# Patient Record
Sex: Female | Born: 1970 | Hispanic: No | Smoking: Never smoker
Health system: Southern US, Community
[De-identification: ages and names within clinical notes are randomized; demographics above are authoritative.]

## PROBLEM LIST (undated history)

## (undated) DIAGNOSIS — K219 Gastro-esophageal reflux disease without esophagitis: Secondary | ICD-10-CM

## (undated) DIAGNOSIS — U071 COVID-19: Secondary | ICD-10-CM

## (undated) DIAGNOSIS — J309 Allergic rhinitis, unspecified: Secondary | ICD-10-CM

## (undated) DIAGNOSIS — R011 Cardiac murmur, unspecified: Secondary | ICD-10-CM

## (undated) DIAGNOSIS — G43909 Migraine, unspecified, not intractable, without status migrainosus: Secondary | ICD-10-CM

## (undated) DIAGNOSIS — Z8719 Personal history of other diseases of the digestive system: Secondary | ICD-10-CM

## (undated) DIAGNOSIS — N83202 Unspecified ovarian cyst, left side: Secondary | ICD-10-CM

## (undated) DIAGNOSIS — G4730 Sleep apnea, unspecified: Secondary | ICD-10-CM

## (undated) DIAGNOSIS — R2230 Localized swelling, mass and lump, unspecified upper limb: Secondary | ICD-10-CM

## (undated) DIAGNOSIS — N200 Calculus of kidney: Secondary | ICD-10-CM

## (undated) DIAGNOSIS — F419 Anxiety disorder, unspecified: Secondary | ICD-10-CM

## (undated) DIAGNOSIS — T7840XA Allergy, unspecified, initial encounter: Secondary | ICD-10-CM

## (undated) HISTORY — DX: Localized swelling, mass and lump, unspecified upper limb: R22.30

## (undated) HISTORY — DX: Gastro-esophageal reflux disease without esophagitis: K21.9

## (undated) HISTORY — PX: HERNIA REPAIR: SHX51

## (undated) HISTORY — PX: CHOLECYSTECTOMY: SHX55

## (undated) HISTORY — DX: COVID-19: U07.1

## (undated) HISTORY — DX: Allergy, unspecified, initial encounter: T78.40XA

## (undated) HISTORY — DX: Unspecified ovarian cyst, left side: N83.202

## (undated) HISTORY — DX: Migraine, unspecified, not intractable, without status migrainosus: G43.909

## (undated) HISTORY — PX: REDUCTION MAMMAPLASTY: SUR839

## (undated) HISTORY — DX: Cardiac murmur, unspecified: R01.1

## (undated) HISTORY — PX: ABDOMINAL HYSTERECTOMY: SHX81

---

## 2006-07-03 ENCOUNTER — Ambulatory Visit: Payer: Self-pay | Admitting: Gastroenterology

## 2006-10-13 ENCOUNTER — Ambulatory Visit: Payer: Self-pay | Admitting: Obstetrics and Gynecology

## 2006-10-19 ENCOUNTER — Inpatient Hospital Stay: Payer: Self-pay | Admitting: Obstetrics and Gynecology

## 2007-09-28 ENCOUNTER — Other Ambulatory Visit: Payer: Self-pay

## 2007-09-28 ENCOUNTER — Emergency Department: Payer: Self-pay | Admitting: Emergency Medicine

## 2007-10-04 ENCOUNTER — Ambulatory Visit: Payer: Self-pay | Admitting: Emergency Medicine

## 2007-12-17 ENCOUNTER — Ambulatory Visit: Payer: Self-pay | Admitting: Unknown Physician Specialty

## 2007-12-31 ENCOUNTER — Ambulatory Visit: Payer: Self-pay | Admitting: Internal Medicine

## 2008-01-27 ENCOUNTER — Ambulatory Visit: Payer: Self-pay | Admitting: Internal Medicine

## 2008-11-06 ENCOUNTER — Encounter (INDEPENDENT_AMBULATORY_CARE_PROVIDER_SITE_OTHER): Payer: Self-pay | Admitting: Specialist

## 2008-11-06 ENCOUNTER — Ambulatory Visit (HOSPITAL_BASED_OUTPATIENT_CLINIC_OR_DEPARTMENT_OTHER): Admission: RE | Admit: 2008-11-06 | Discharge: 2008-11-07 | Payer: Self-pay | Admitting: Specialist

## 2009-03-20 ENCOUNTER — Emergency Department: Payer: Self-pay | Admitting: Internal Medicine

## 2009-08-12 ENCOUNTER — Emergency Department: Payer: Self-pay | Admitting: Emergency Medicine

## 2009-11-20 ENCOUNTER — Emergency Department: Payer: Self-pay | Admitting: Emergency Medicine

## 2010-01-02 ENCOUNTER — Ambulatory Visit: Payer: Self-pay | Admitting: Neurology

## 2010-08-18 HISTORY — PX: GASTRIC BYPASS: SHX52

## 2010-08-18 HISTORY — PX: OTHER SURGICAL HISTORY: SHX169

## 2010-08-22 ENCOUNTER — Emergency Department: Payer: Self-pay

## 2010-09-18 ENCOUNTER — Ambulatory Visit: Payer: Self-pay | Admitting: Specialist

## 2010-09-30 ENCOUNTER — Ambulatory Visit: Payer: Self-pay | Admitting: Gastroenterology

## 2010-10-07 ENCOUNTER — Ambulatory Visit: Payer: Self-pay | Admitting: Specialist

## 2010-10-07 LAB — PATHOLOGY REPORT

## 2010-10-17 ENCOUNTER — Ambulatory Visit: Payer: Self-pay | Admitting: Specialist

## 2010-11-07 ENCOUNTER — Ambulatory Visit: Payer: Self-pay | Admitting: Specialist

## 2010-11-12 ENCOUNTER — Inpatient Hospital Stay: Payer: Self-pay | Admitting: Specialist

## 2010-11-28 LAB — BASIC METABOLIC PANEL
BUN: 12 mg/dL (ref 6–23)
Chloride: 106 mEq/L (ref 96–112)
Creatinine, Ser: 0.86 mg/dL (ref 0.4–1.2)
Glucose, Bld: 90 mg/dL (ref 70–99)

## 2010-11-28 LAB — POCT HEMOGLOBIN-HEMACUE: Hemoglobin: 13.7 g/dL (ref 12.0–15.0)

## 2010-12-31 NOTE — Op Note (Signed)
NAMELEE-ANN, GAL                 ACCOUNT NO.:  [id number]   MEDICAL RECORD NO.:  [id number]          PATIENT TYPE:  AMB   LOCATION:  DSC                          FACILITY:  [facility]   PHYSICIAN:  Earvin Hansen L. Truesdale, M.D.DATE OF BIRTH:  Jun 03, 1971   DATE OF PROCEDURE:  11/06/2008  DATE OF DISCHARGE:                               OPERATIVE REPORT   Patient with severe macromastia back and shoulder pain secondary to  large pendulous breasts, intertriginous changes in the past failing  physical therapy as well as heat and cold packs and conservative drop  bras.   PROCEDURES PLANNED:  Bilateral breast reductions using the inferior  pedicle technique.   ANESTHESIA:  General.   The patient underwent general anesthesia and intubated orally after she  has had proper drawings done for the inferior pedicle reduction  mammoplasty, remarked the nipple-areolar complex back up 21 cm from the  suprasternal notch.  After the prep was done, 0.25% Xylocaine with  epinephrine was injected locally for vasoconstriction.  The wound was  first scored with #15 blade and the skin over the inferior pedicle was  de-epithelialized with #20 blade.  Medial and lateral fatty dermal  pedicles were incised down to underlying pectoralis fascia.  After  proper hemostasis, a new keyhole area was also debulked out laterally  and more accessory breast tissue removed.  Irrigation was done and  hemostasis again maintained with Bovie coagulation.  After this, the  flaps were transposed and stayed with 3-0 Prolene.  Subcutaneous closure  was done with 3-0 Monocryl x2 layers and running subcuticular stitches  of 3-0 Monocryl and 5-0 Monocryl throughout the inverted T.  At the end  of the procedure, both nipple-areolar complexes were examined with  excellent blood supply.  The wounds were drained with #10 fully fluted  Blake drains which were placed in the depths of the wound and the  lateral-most portion of the incision  was secured with 3-0 Prolene.  The  wounds were cleansed.  Steri-Strips and soft dressing were applied and  Xeroform, 4x4s, ABDs, and Hypafix tape.  She withstood the procedures  very well, was taken to recovery in excellent condition.      Yaakov Guthrie. Shon Hough, M.D.  Electronically Signed     GLT/MEDQ  D:  11/06/2008  T:  11/07/2008  Job:  [id number]

## 2011-04-03 ENCOUNTER — Ambulatory Visit: Payer: Self-pay | Admitting: Unknown Physician Specialty

## 2011-04-04 ENCOUNTER — Ambulatory Visit: Payer: Self-pay | Admitting: Bariatrics

## 2011-04-14 ENCOUNTER — Ambulatory Visit: Payer: Self-pay | Admitting: Specialist

## 2011-06-09 ENCOUNTER — Emergency Department: Payer: Self-pay | Admitting: *Deleted

## 2011-12-04 ENCOUNTER — Ambulatory Visit: Payer: Self-pay | Admitting: Internal Medicine

## 2012-01-12 ENCOUNTER — Emergency Department: Payer: Self-pay | Admitting: *Deleted

## 2012-04-16 ENCOUNTER — Ambulatory Visit: Payer: Self-pay | Admitting: Internal Medicine

## 2012-11-26 DIAGNOSIS — K912 Postsurgical malabsorption, not elsewhere classified: Secondary | ICD-10-CM | POA: Insufficient documentation

## 2012-11-26 HISTORY — DX: Postsurgical malabsorption, not elsewhere classified: K91.2

## 2012-12-06 ENCOUNTER — Ambulatory Visit: Payer: Self-pay | Admitting: Internal Medicine

## 2012-12-20 ENCOUNTER — Ambulatory Visit: Payer: Self-pay | Admitting: Internal Medicine

## 2013-03-18 ENCOUNTER — Ambulatory Visit: Payer: Self-pay | Admitting: Orthopedic Surgery

## 2013-09-22 ENCOUNTER — Emergency Department: Payer: Self-pay | Admitting: Emergency Medicine

## 2013-12-07 ENCOUNTER — Ambulatory Visit: Payer: Self-pay | Admitting: Internal Medicine

## 2013-12-12 ENCOUNTER — Ambulatory Visit: Payer: Self-pay | Admitting: Internal Medicine

## 2013-12-19 ENCOUNTER — Ambulatory Visit: Payer: Self-pay | Admitting: Internal Medicine

## 2013-12-19 HISTORY — PX: BREAST BIOPSY: SHX20

## 2013-12-21 LAB — PATHOLOGY REPORT

## 2014-01-20 DIAGNOSIS — G4733 Obstructive sleep apnea (adult) (pediatric): Secondary | ICD-10-CM

## 2014-01-20 HISTORY — DX: Obstructive sleep apnea (adult) (pediatric): G47.33

## 2014-02-02 ENCOUNTER — Ambulatory Visit: Payer: Self-pay | Admitting: Internal Medicine

## 2015-01-08 ENCOUNTER — Other Ambulatory Visit: Payer: Self-pay | Admitting: Internal Medicine

## 2015-01-08 DIAGNOSIS — D649 Anemia, unspecified: Secondary | ICD-10-CM | POA: Insufficient documentation

## 2015-01-08 DIAGNOSIS — Z1231 Encounter for screening mammogram for malignant neoplasm of breast: Secondary | ICD-10-CM

## 2015-01-08 DIAGNOSIS — R7303 Prediabetes: Secondary | ICD-10-CM | POA: Insufficient documentation

## 2015-01-08 HISTORY — DX: Anemia, unspecified: D64.9

## 2015-01-22 ENCOUNTER — Ambulatory Visit: Payer: Self-pay

## 2015-01-26 ENCOUNTER — Ambulatory Visit
Admission: RE | Admit: 2015-01-26 | Discharge: 2015-01-26 | Disposition: A | Discharge status: Home / Self Care | Payer: BLUE CROSS/BLUE SHIELD | Source: Ambulatory Visit | Attending: Internal Medicine | Admitting: Internal Medicine

## 2015-01-26 DIAGNOSIS — Z1231 Encounter for screening mammogram for malignant neoplasm of breast: Secondary | ICD-10-CM | POA: Diagnosis not present

## 2015-03-29 ENCOUNTER — Encounter: Payer: Self-pay | Admitting: Podiatry

## 2015-03-29 ENCOUNTER — Other Ambulatory Visit: Payer: Self-pay | Admitting: *Deleted

## 2015-03-29 ENCOUNTER — Ambulatory Visit (INDEPENDENT_AMBULATORY_CARE_PROVIDER_SITE_OTHER): Payer: BLUE CROSS/BLUE SHIELD | Admitting: Podiatry

## 2015-03-29 VITALS — BP 114/72 | HR 63 | Resp 17

## 2015-03-29 DIAGNOSIS — S90229A Contusion of unspecified lesser toe(s) with damage to nail, initial encounter: Secondary | ICD-10-CM | POA: Diagnosis not present

## 2015-03-29 DIAGNOSIS — L603 Nail dystrophy: Secondary | ICD-10-CM | POA: Diagnosis not present

## 2015-03-29 DIAGNOSIS — J309 Allergic rhinitis, unspecified: Secondary | ICD-10-CM | POA: Insufficient documentation

## 2015-03-29 NOTE — Progress Notes (Signed)
   Subjective:    Patient ID: Valerie Colon, female    DOB: 1971-06-23, 44 y.o.   MRN: [id number]  HPI 44 year old female presents the office today for concerns of discoloration to both her big toenails. She states is ongoing for proximally 2 months and his been up the same. She denies any pain to her toenails and she denies any surrounding redness or drainage. She denies any history of injury or trauma. No tenderness. No other complaints at this time.   Review of Systems  Hematological:       Anemia   All other systems reviewed and are negative.      Objective:   Physical Exam AAO x3, NAD DP/PT pulses palpable bilaterally, CRT less than 3 seconds Protective sensation intact with Simms Weinstein monofilament, vibratory sensation intact, Achilles tendon reflex intact On the left hallux toenail with central aspect is an area of subungual hematoma. The nails is firmly adhered to the underlying nail bed and there is no significant discoloration and thickening of the nail. The right hallux nail also has small areas of subungual hematoma nails have a hypertrophic, dystrophic, discolored and brittle. There is no tenderness to palpation around the nail. There is no surrounding erythema or drainage. No areas of tenderness to bilateral lower extremities. MMT 5/5, ROM WNL.  No open lesions or pre-ulcerative lesions.  No overlying edema, erythema, increase in warmth to bilateral lower extremities.  No pain with calf compression, swelling, warmth, erythema bilaterally.     Assessment & Plan:  44 year old female small areas of subungual hematoma bilateral hallux nails with slight discoloration and onychodystrophy right hallux toenail -Treatment options discussed including all alternatives, risks, and complications -Discussed likely etiology of her symptoms. -Discussed with the blood spots should grow out with the nail. Continue to monitor. I discussed nail removal with the patient in biopsy how she  wishes to hold off on that at this time. -The right hallux toenail was trimmed and sent for biopsy to [facility] labs for possible onychomycosis. I discussed. Treatment options however we will await the results of the biopsy before proceeding with treatment. -Follow-up after nail biopsy or sooner if any problems arise. In the meantime, encouraged to call the office with any questions, concerns, change in symptoms.   Celesta Gentile, DPM

## 2015-03-29 NOTE — Patient Instructions (Signed)

## 2015-04-12 ENCOUNTER — Telehealth: Payer: Self-pay | Admitting: *Deleted

## 2015-04-12 NOTE — Telephone Encounter (Signed)
CALLED PATIENT AND EXPLAINED THE FUNGAL CULTURE TO HER AND I TOLD HER THAT SHE COULD USE NOVAIL OR GO TO THE DRUG STORE AND GET FUNGI NAIL AND FUNGI CURE AND IF THAT DID NOT WORK TO CALL OFFICE BACK AND WE WOULD SEND A RX. Robin Pafford

## 2015-04-27 ENCOUNTER — Telehealth: Payer: Self-pay | Admitting: *Deleted

## 2015-04-27 MED ORDER — NUVAIL EX SOLN: 1.0000 [drp] | Freq: Every day | CUTANEOUS | Status: DC

## 2015-04-27 NOTE — Telephone Encounter (Signed)
PT STATES SHE RECEIVED A PHONE MESSAGE STATING HER TOENAIL CULTURE WAS NEGATIVE AND DR. Jacqualyn Posey WANTED HER TO USE NUVAIL.  I INFORMED PT THE NUVAIL WAS ORDERED FROM A PHARMACY THAT WOULD TRY TO GET NUVAIL COVERED BY INSURANCE, BUT IF IT DID NOT COST WOULD BE OUT OF HER POCKET.  PT STATES UNDERSTANDING AND I MAILED HER THE PHARMACY [facility] INFORMATION AND TOLD HER TO CALL WHEN SHE RECEIVED THE INFORMATION.  MAILED TO PT'S HOME.

## 2015-04-27 NOTE — Telephone Encounter (Signed)
Called patient and left message stating that the fungal culture was negative and that we could try nuvail and to call the office. Valerie Colon

## 2015-07-06 ENCOUNTER — Encounter: Payer: Self-pay | Admitting: Podiatry

## 2015-07-09 DIAGNOSIS — E559 Vitamin D deficiency, unspecified: Secondary | ICD-10-CM | POA: Insufficient documentation

## 2015-11-09 DIAGNOSIS — R002 Palpitations: Secondary | ICD-10-CM | POA: Insufficient documentation

## 2015-11-09 DIAGNOSIS — R457 State of emotional shock and stress, unspecified: Secondary | ICD-10-CM | POA: Insufficient documentation

## 2015-11-09 HISTORY — DX: Palpitations: R00.2

## 2015-11-09 HISTORY — DX: State of emotional shock and stress, unspecified: R45.7

## 2015-11-20 DIAGNOSIS — F411 Generalized anxiety disorder: Secondary | ICD-10-CM

## 2015-11-20 HISTORY — DX: Generalized anxiety disorder: F41.1

## 2016-01-24 DIAGNOSIS — R42 Dizziness and giddiness: Secondary | ICD-10-CM

## 2016-01-24 DIAGNOSIS — R5382 Chronic fatigue, unspecified: Secondary | ICD-10-CM | POA: Insufficient documentation

## 2016-01-24 DIAGNOSIS — K219 Gastro-esophageal reflux disease without esophagitis: Secondary | ICD-10-CM | POA: Insufficient documentation

## 2016-01-24 HISTORY — DX: Dizziness and giddiness: R42

## 2016-01-24 HISTORY — DX: Chronic fatigue, unspecified: R53.82

## 2016-01-25 ENCOUNTER — Other Ambulatory Visit: Payer: Self-pay | Admitting: Internal Medicine

## 2016-01-25 DIAGNOSIS — Z1239 Encounter for other screening for malignant neoplasm of breast: Secondary | ICD-10-CM

## 2016-02-12 ENCOUNTER — Ambulatory Visit: Payer: BLUE CROSS/BLUE SHIELD

## 2016-02-15 ENCOUNTER — Ambulatory Visit: Payer: BLUE CROSS/BLUE SHIELD

## 2016-02-22 ENCOUNTER — Other Ambulatory Visit: Payer: Self-pay | Admitting: Internal Medicine

## 2016-02-22 ENCOUNTER — Ambulatory Visit
Admission: RE | Admit: 2016-02-22 | Discharge: 2016-02-22 | Disposition: A | Discharge status: Home / Self Care | Payer: BLUE CROSS/BLUE SHIELD | Source: Ambulatory Visit | Attending: Internal Medicine | Admitting: Internal Medicine

## 2016-02-22 DIAGNOSIS — Z1231 Encounter for screening mammogram for malignant neoplasm of breast: Secondary | ICD-10-CM | POA: Insufficient documentation

## 2016-02-22 DIAGNOSIS — Z1239 Encounter for other screening for malignant neoplasm of breast: Secondary | ICD-10-CM

## 2016-02-29 DIAGNOSIS — N200 Calculus of kidney: Secondary | ICD-10-CM

## 2016-02-29 HISTORY — DX: Calculus of kidney: N20.0

## 2016-03-18 ENCOUNTER — Encounter: Payer: Self-pay | Admitting: Urology

## 2016-03-18 ENCOUNTER — Ambulatory Visit (INDEPENDENT_AMBULATORY_CARE_PROVIDER_SITE_OTHER): Payer: BLUE CROSS/BLUE SHIELD | Admitting: Urology

## 2016-03-18 VITALS — BP 111/72 | HR 59 | Ht 66.0 in | Wt 178.6 lb

## 2016-03-18 DIAGNOSIS — N200 Calculus of kidney: Secondary | ICD-10-CM

## 2016-03-18 LAB — URINALYSIS, COMPLETE
BILIRUBIN UA: NEGATIVE
Glucose, UA: NEGATIVE
Ketones, UA: NEGATIVE
Leukocytes, UA: NEGATIVE
NITRITE UA: NEGATIVE
PH UA: 5 (ref 5.0–7.5)
Protein, UA: NEGATIVE
RBC UA: NEGATIVE
Specific Gravity, UA: >1.03 — ABNORMAL HIGH (ref 1.005–1.030)
UUROB: 0.2 mg/dL (ref 0.2–1.0)

## 2016-03-18 LAB — MICROSCOPIC EXAMINATION

## 2016-03-18 NOTE — Progress Notes (Signed)
03/18/2016 9:16 AM   Valerie Colon 03/07/1971 [id number]  Referring provider: Glendon Axe, MD [facility] [place], [place] [address]  Chief Complaint  Patient presents with  . New Patient (Initial Visit)    Nephrolithiasis    HPI: [name] is a 45yo seen today for nephrolithiasis. She is referred by Dr. Candiss Norse.  She had a KUB at [place] [facility] but no CT imaging.  3 weeks ago she developed severe, intermittent, sharp, nonradiating left flank pain. The pain improved several days ago but she continues to have a dull ache. This is her 8th stone. Her first stone was 5 years. She had gastric bypass 5 years ago. She has never needed surgery for stones. She has never retrieved a stone for analysis.  She denies any LUTS. No hematuria.  UA today is normal.    PMH: Past Medical History:  Diagnosis Date  . Acid reflux   . Heart murmur     Surgical History: Past Surgical History:  Procedure Laterality Date  . ABDOMINAL HYSTERECTOMY    . BREAST BIOPSY Right 12/19/2013   stereo right  . gallbladder  2012  . GASTRIC BYPASS  2012  . REDUCTION MAMMAPLASTY Bilateral    2010    Home Medications:    Medication List       Accurate as of 03/18/16  9:16 AM. Always use your most recent med list.          ferrous sulfate 325 (65 FE) MG tablet TK 1 T PO D WITH BRE   multivitamin tablet Take 1 tablet by mouth daily.   NUVAIL Soln Apply 1 drop topically daily.   RA VITAMIN B-12 TR 1000 MCG Tbcr Generic drug:  Cyanocobalamin Take by mouth.       Allergies: No Known Allergies  Family History: No family history on file.  Social History:  reports that she has never smoked. She has never used smokeless tobacco. Her alcohol and drug histories are not on file.  ROS: UROLOGY Frequent Urination?: No Hard to postpone urination?: No Burning/pain with urination?: No Get up at night to urinate?: No Leakage of urine?: No Urine stream starts and  stops?: No Trouble starting stream?: No Do you have to strain to urinate?: No Blood in urine?: No Urinary tract infection?: No Sexually transmitted disease?: No Injury to kidneys or bladder?: No Painful intercourse?: No Weak stream?: No Currently pregnant?: No Vaginal bleeding?: No Last menstrual period?: n  Gastrointestinal Nausea?: No Vomiting?: No Indigestion/heartburn?: No Diarrhea?: No Constipation?: No  Constitutional Fever: No Night sweats?: No Weight loss?: No Fatigue?: Yes  Skin Skin rash/lesions?: No Itching?: No  Eyes Blurred vision?: No Double vision?: No  Ears/Nose/Throat Sore throat?: No Sinus problems?: No  Hematologic/Lymphatic Swollen glands?: No Easy bruising?: No  Cardiovascular Leg swelling?: No Chest pain?: No  Respiratory Cough?: No Shortness of breath?: No  Endocrine Excessive thirst?: No  Musculoskeletal Back pain?: Yes Joint pain?: No  Neurological Headaches?: No Dizziness?: No  Psychologic Depression?: No Anxiety?: No  Physical Exam: BP 111/72   Pulse (!) 59   Ht 5\' 6"  (1.676 m)   Wt 81 kg (178 lb 9.6 oz)   LMP  (LMP Unknown)   BMI 28.83 kg/m   Constitutional:  Alert and oriented, No acute distress. HEENT: [place] AT, moist mucus membranes.  Trachea midline, no masses. Cardiovascular: No clubbing, cyanosis, or edema. Respiratory: Normal respiratory effort, no increased work of breathing. GI: Abdomen is soft, nontender, nondistended,  no abdominal masses GU: No CVA tenderness.  Skin: No rashes, bruises or suspicious lesions. Lymph: No cervical or inguinal adenopathy. Neurologic: Grossly intact, no focal deficits, moving all 4 extremities. Psychiatric: Normal mood and affect.  Laboratory Data: Lab Results  Component Value Date   HGB 13.7 11/06/2008    Lab Results  Component Value Date   CREATININE 0.86 11/03/2008    No results found for: PSA  No results found for: TESTOSTERONE  No results found for:  HGBA1C  Urinalysis No results found for: COLORURINE, APPEARANCEUR, LABSPEC, PHURINE, GLUCOSEU, HGBUR, BILIRUBINUR, KETONESUR, PROTEINUR, UROBILINOGEN, NITRITE, LEUKOCYTESUR  Pertinent Imaging: none  Assessment & Plan:    1. Nephrolithiasis -Renal US and KUB today -BMP, iPTH, uric acid -24 hour urine - Urinalysis, Complete --RTC 2-3 weeks   No Follow-up on file.  Nicolette Bang, MD  [facility] [address], [place],  [place] [address] [phone]

## 2016-03-29 ENCOUNTER — Emergency Department
Admission: EM | Admit: 2016-03-29 | Discharge: 2016-03-30 | Disposition: A | Discharge status: Home / Self Care | Payer: BLUE CROSS/BLUE SHIELD | Attending: Emergency Medicine | Admitting: Emergency Medicine

## 2016-03-29 ENCOUNTER — Encounter: Payer: Self-pay | Admitting: Emergency Medicine

## 2016-03-29 ENCOUNTER — Emergency Department: Payer: BLUE CROSS/BLUE SHIELD

## 2016-03-29 DIAGNOSIS — S93402A Sprain of unspecified ligament of left ankle, initial encounter: Secondary | ICD-10-CM | POA: Diagnosis not present

## 2016-03-29 DIAGNOSIS — Y929 Unspecified place or not applicable: Secondary | ICD-10-CM | POA: Insufficient documentation

## 2016-03-29 DIAGNOSIS — Y999 Unspecified external cause status: Secondary | ICD-10-CM | POA: Insufficient documentation

## 2016-03-29 DIAGNOSIS — S93602A Unspecified sprain of left foot, initial encounter: Secondary | ICD-10-CM | POA: Diagnosis not present

## 2016-03-29 DIAGNOSIS — X501XXA Overexertion from prolonged static or awkward postures, initial encounter: Secondary | ICD-10-CM | POA: Insufficient documentation

## 2016-03-29 DIAGNOSIS — Z79899 Other long term (current) drug therapy: Secondary | ICD-10-CM | POA: Insufficient documentation

## 2016-03-29 DIAGNOSIS — Y9301 Activity, walking, marching and hiking: Secondary | ICD-10-CM | POA: Insufficient documentation

## 2016-03-29 DIAGNOSIS — M25572 Pain in left ankle and joints of left foot: Secondary | ICD-10-CM | POA: Diagnosis present

## 2016-03-29 MED ORDER — IBUPROFEN 200 MG PO TABS: 600.0000 mg | ORAL_TABLET | Freq: Four times a day (QID) | ORAL | 0 refills | Status: DC | PRN

## 2016-03-29 MED ORDER — IBUPROFEN 600 MG PO TABS
600.0000 mg | ORAL_TABLET | Freq: Once | ORAL | Status: AC
  Administered 2016-03-29: 600 mg via ORAL
  Filled 2016-03-29: qty 1

## 2016-03-29 NOTE — ED Provider Notes (Signed)
[facility] Emergency Department Provider Note  ____________________________________________   I have reviewed the triage vital signs and the nursing notes.   HISTORY  Chief Complaint Ankle Pain    HPI TAWNI WOLPER is a 45 y.o. female who inverted her left foot while walking. Did not hitting her head and has has no other complaints aside from bilateral ankle pain and lateral foot discomfort. She can bear weight.       Past Medical History:  Diagnosis Date  . Acid reflux   . Heart murmur     Patient Active Problem List   Diagnosis Date Noted  . Nephrolithiasis 02/29/2016  . Chronic fatigue 01/24/2016  . Dizziness 01/24/2016  . Gastroesophageal reflux disease without esophagitis 01/24/2016  . GAD (generalized anxiety disorder) 11/20/2015  . Emotional stress 11/09/2015  . Intermittent palpitations 11/09/2015  . Vitamin D deficiency 07/09/2015  . Allergic rhinitis 03/29/2015  . Chronic anemia 01/08/2015  . Prediabetes 01/08/2015  . Obstructive apnea 01/20/2014  . Hypoglycemia after GI (gastrointestinal) surgery 11/26/2012    Past Surgical History:  Procedure Laterality Date  . ABDOMINAL HYSTERECTOMY    . BREAST BIOPSY Right 12/19/2013   stereo right  . gallbladder  2012  . GASTRIC BYPASS  2012  . REDUCTION MAMMAPLASTY Bilateral    2010    Current Outpatient Rx  . Order #: [id number] Class: Historical Med  . Order #: [id number] Class: Normal  . Order #: [id number] Class: Historical Med  . Order #: [id number] Class: Historical Med    Allergies Review of patient's allergies indicates no known allergies.  History reviewed. No pertinent family history.  Social History Social History  Substance Use Topics  . Smoking status: Never Smoker  . Smokeless tobacco: Never Used  . Alcohol use No    Review of Systems   ____________________________________________   PHYSICAL EXAM:  VITAL SIGNS: ED Triage Vitals [03/29/16 2246]  Enc Vitals  Group     BP 114/74     Pulse Rate 86     Resp 16     Temp 98.4 F (36.9 C)     Temp Source Oral     SpO2 95 %     Weight 170 lb (77.1 kg)     Height 5\' 6"  (1.676 m)     Head Circumference      Peak Flow      Pain Score 3     Pain Loc      Pain Edu?      Excl. in [place]?     Constitutional: Alert and oriented. Well appearing and in no acute distress. Eyes: Conjunctivae are normal. PERRL. EOMI. Head: Atraumatic.  Musculoskeletal: Tennis palpation over the area just anterior to the lateral malleolus. There is no joint instability. Strong PT and dorsal pedal pulses. There is minimal tenderness to palpation to the mid foot. There is no evidence of instability noted. no upper extremity tenderness. No joint effusions, no DVT signs strong distal pulses no edema  Neurologic:  Normal speech and language. No gross focal neurologic deficits are appreciated.  Skin:  Skin is warm, dry and intact. No rash noted. Psychiatric: Mood and affect are normal. Speech and behavior are normal.  ____________________________________________   LABS (all labs ordered are listed, but only abnormal results are displayed)  Labs Reviewed - No data to display ____________________________________________  EKG  I personally interpreted any EKGs ordered by me or triage  ____________________________________________  RADIOLOGY  I reviewed any imaging ordered by me or triage that  were performed during my shift and, if possible, patient and/or family made aware of any abnormal findings. ____________________________________________   PROCEDURES  Procedure(s) performed: None  Procedures  Critical Care performed: None  ____________________________________________   INITIAL IMPRESSION / ASSESSMENT AND PLAN / ED COURSE  Pertinent labs & imaging results that were available during my care of the patient were reviewed by me and considered in my medical decision making (see chart for details).  Patient  with minimal discomfort after a fall with no evidence of swelling. Likely a stage I ankle sprain. No evidence of Lisfranc fracture other acute pathology neurovascular intact compartment are soft no knee pain or hip pain or tenderness. We'll treat her with Aircast, crutches, NSAIDs and follow-up.  Clinical Course   ____________________________________________   FINAL CLINICAL IMPRESSION(S) / ED DIAGNOSES  Final diagnoses:  None      This chart was dictated using voice recognition software.  Despite best efforts to proofread,  errors can occur which can change meaning.      Schuyler Amor, MD 03/29/16 [phone]

## 2016-03-29 NOTE — ED Triage Notes (Signed)
Pt to triage via [facility], reports stepped off a curb and twisted left ankle, pain to medial aspect of foot, through ankle, up lower leg.  Pt reports able to walk but with great difficulty.  Pt NAD at this time, resp equal and unlabored, skin warm and dry.

## 2016-04-01 DIAGNOSIS — S93629A Sprain of tarsometatarsal ligament of unspecified foot, initial encounter: Secondary | ICD-10-CM

## 2016-04-01 HISTORY — DX: Sprain of tarsometatarsal ligament of unspecified foot, initial encounter: S93.629A

## 2016-04-11 ENCOUNTER — Ambulatory Visit
Admission: RE | Admit: 2016-04-11 | Discharge: 2016-04-11 | Disposition: A | Discharge status: Home / Self Care | Payer: BLUE CROSS/BLUE SHIELD | Source: Ambulatory Visit | Attending: Urology | Admitting: Urology

## 2016-04-11 DIAGNOSIS — N200 Calculus of kidney: Secondary | ICD-10-CM

## 2016-04-11 DIAGNOSIS — N281 Cyst of kidney, acquired: Secondary | ICD-10-CM | POA: Insufficient documentation

## 2016-04-11 DIAGNOSIS — R140 Abdominal distension (gaseous): Secondary | ICD-10-CM | POA: Diagnosis not present

## 2016-04-18 ENCOUNTER — Ambulatory Visit (INDEPENDENT_AMBULATORY_CARE_PROVIDER_SITE_OTHER): Payer: BLUE CROSS/BLUE SHIELD | Admitting: Urology

## 2016-04-18 ENCOUNTER — Encounter: Payer: Self-pay | Admitting: Urology

## 2016-04-18 VITALS — BP 94/60 | HR 64 | Ht 66.0 in | Wt 184.0 lb

## 2016-04-18 DIAGNOSIS — N200 Calculus of kidney: Secondary | ICD-10-CM

## 2016-04-18 NOTE — Progress Notes (Signed)
04/18/2016 10:46 AM   Valerie Colon 05-May-1971 [id number]  Referring provider: Glendon Axe, MD [facility] [place], [place] [address]  Chief Complaint  Patient presents with  . Follow-up    Nephrolithiasis     HPI: 45 yo F who returns today for follow-up RUS/ KUB for kidney stones.    She was seen in the clinic last month by Dr. Alyson Ingles for evaluation of severe left flank pain which resolved into a dull ache. Since her last visit, her pain is completely resolved.  No urinary tract symptoms.  Follow-up renal ultrasound shows no evidence of hydronephrosis. She does have a 5 mm nonobstructing right lower pole calcification but otherwise no significant stone burden.  This is her 8th stone. Her first stone was 5 years. She had gastric bypass 5 years ago. She has never needed surgery for stones.  24 hour urine metabolic studies were ordered but not completed.  She does to her best to drink plenty of water.  PMH: Past Medical History:  Diagnosis Date  . Acid reflux   . Heart murmur     Surgical History: Past Surgical History:  Procedure Laterality Date  . ABDOMINAL HYSTERECTOMY    . BREAST BIOPSY Right 12/19/2013   stereo right  . gallbladder  2012  . GASTRIC BYPASS  2012  . REDUCTION MAMMAPLASTY Bilateral    2010    Home Medications:    Medication List       Accurate as of 04/18/16 11:59 PM. Always use your most recent med list.          ferrous sulfate 325 (65 FE) MG tablet TK 1 T PO D WITH BRE   ibuprofen 200 MG tablet Commonly known as:  MOTRIN IB Take 3 tablets (600 mg total) by mouth every 6 (six) hours as needed.   multivitamin tablet Take 1 tablet by mouth daily.   RA VITAMIN B-12 TR 1000 MCG Tbcr Generic drug:  Cyanocobalamin Take by mouth.       Allergies: No Known Allergies  Family History: No family history on file.  Social History:  reports that she has never smoked. She has never used smokeless tobacco.  She reports that she does not drink alcohol or use drugs.  ROS:  12 system ROS negative other than as per HPI  Physical Exam: BP 94/60   Pulse 64   Ht 5\' 6"  (1.676 m)   Wt 184 lb (83.5 kg)   LMP  (LMP Unknown)   BMI 29.70 kg/m   Constitutional:  Alert and oriented, No acute distress. HEENT: [place] AT, moist mucus membranes.  Trachea midline, no masses. Cardiovascular: No clubbing, cyanosis, or edema. Respiratory: Normal respiratory effort, no increased work of breathing. GI: Abdomen is soft, nontender, nondistended, no abdominal masses GU: No CVA tenderness.  Skin: No rashes, bruises or suspicious lesions. Neurologic: Grossly intact, no focal deficits, moving all 4 extremities. Psychiatric: Normal mood and affect.  Laboratory Data: Lab Results  Component Value Date   HGB 13.7 11/06/2008    Lab Results  Component Value Date   CREATININE 0.86 11/03/2008    Urinalysis    Component Value Date/Time   APPEARANCEUR Cloudy (A) 03/18/2016 0844   GLUCOSEU Negative 03/18/2016 0844   BILIRUBINUR Negative 03/18/2016 0844   PROTEINUR Negative 03/18/2016 0844   NITRITE Negative 03/18/2016 0844   LEUKOCYTESUR Negative 03/18/2016 0844    Pertinent Imaging: CLINICAL DATA:  Left side pain starting last night, history of kidney  stone  EXAM: ABDOMEN - 1 VIEW  COMPARISON:  02/23/2016  FINDINGS: There is normal small bowel gas pattern. Again noted 5 mm calcification lower pole region of the right kidney. Pelvic phleboliths are again noted.  IMPRESSION: Normal small bowel gas pattern.  Right nephrolithiasis.   Electronically Signed   By: Lahoma Crocker M.D.   On: 04/11/2016 13:27  CLINICAL DATA:  Nephrolithiasis  EXAM: RENAL / URINARY TRACT ULTRASOUND COMPLETE  COMPARISON:  None.  FINDINGS: Right Kidney:  Length: 10.3 cm. Small renal calculus is noted without obstructive change. A 1 cm cyst is noted within the upper pole of the right kidney.  Left  Kidney:  Length: 10.2 cm. Echogenicity within normal limits. No mass or hydronephrosis visualized.  Bladder:  Incompletely distended.  IMPRESSION: Tiny nonobstructing right renal stone.  Right renal cyst.  No acute abnormality noted.   Electronically Signed   By: Inez Catalina M.D.   On: 04/11/2016 13:56  KUB and RUS personally reviewed today  Assessment & Plan:    1. Nephrolithiasis Small nonobstructing right renal stone, otherwise no significant stone burden. No evidence of obstructing stone. Currently asymptomatic.  We discussed general stone prevention techniques including drinking plenty water with goal of producing 2.5 L urine daily, increased citric acid intake, avoidance of high oxalate containing foods, and decreased salt intake.  Information about dietary recommendations given today.   Option for metabolic workup reviewed again today. The patient would prefer to defer this at this time.  We'll continue to follow her small nonobstructing stone to assess for metabolically active stone disease.  She'll follow up sooner as needed should she develop any flank pain.   Return in about 9 months (around 01/16/2017) for f/u KUB.  Hollice Espy, MD  [facility] [address], [place], [place] [address] [phone]

## 2016-04-24 ENCOUNTER — Encounter: Payer: Self-pay | Admitting: Urology

## 2016-05-02 ENCOUNTER — Encounter: Payer: Self-pay | Admitting: *Deleted

## 2016-05-05 ENCOUNTER — Ambulatory Visit: Payer: BLUE CROSS/BLUE SHIELD | Admitting: Anesthesiology

## 2016-05-05 ENCOUNTER — Encounter
Admission: RE | Disposition: A | Discharge status: Home / Self Care | Payer: Self-pay | Source: Ambulatory Visit | Attending: Gastroenterology

## 2016-05-05 ENCOUNTER — Encounter: Payer: Self-pay | Admitting: *Deleted

## 2016-05-05 ENCOUNTER — Ambulatory Visit
Admission: RE | Admit: 2016-05-05 | Discharge: 2016-05-05 | Disposition: A | Discharge status: Home / Self Care | Payer: BLUE CROSS/BLUE SHIELD | Source: Ambulatory Visit | Attending: Gastroenterology | Admitting: Gastroenterology

## 2016-05-05 DIAGNOSIS — K210 Gastro-esophageal reflux disease with esophagitis: Secondary | ICD-10-CM | POA: Diagnosis not present

## 2016-05-05 DIAGNOSIS — Z8371 Family history of colonic polyps: Secondary | ICD-10-CM | POA: Diagnosis not present

## 2016-05-05 DIAGNOSIS — K449 Diaphragmatic hernia without obstruction or gangrene: Secondary | ICD-10-CM | POA: Insufficient documentation

## 2016-05-05 DIAGNOSIS — B9681 Helicobacter pylori [H. pylori] as the cause of diseases classified elsewhere: Secondary | ICD-10-CM | POA: Insufficient documentation

## 2016-05-05 DIAGNOSIS — K640 First degree hemorrhoids: Secondary | ICD-10-CM | POA: Insufficient documentation

## 2016-05-05 DIAGNOSIS — R1013 Epigastric pain: Secondary | ICD-10-CM | POA: Diagnosis present

## 2016-05-05 DIAGNOSIS — D509 Iron deficiency anemia, unspecified: Secondary | ICD-10-CM | POA: Diagnosis not present

## 2016-05-05 DIAGNOSIS — Z9884 Bariatric surgery status: Secondary | ICD-10-CM | POA: Insufficient documentation

## 2016-05-05 DIAGNOSIS — Z79899 Other long term (current) drug therapy: Secondary | ICD-10-CM | POA: Insufficient documentation

## 2016-05-05 DIAGNOSIS — G4730 Sleep apnea, unspecified: Secondary | ICD-10-CM | POA: Insufficient documentation

## 2016-05-05 DIAGNOSIS — K2950 Unspecified chronic gastritis without bleeding: Secondary | ICD-10-CM | POA: Diagnosis not present

## 2016-05-05 HISTORY — DX: Personal history of other diseases of the digestive system: Z87.19

## 2016-05-05 HISTORY — PX: ESOPHAGOGASTRODUODENOSCOPY (EGD) WITH PROPOFOL: SHX5813

## 2016-05-05 HISTORY — DX: Allergic rhinitis, unspecified: J30.9

## 2016-05-05 HISTORY — DX: Anxiety disorder, unspecified: F41.9

## 2016-05-05 HISTORY — DX: Sleep apnea, unspecified: G47.30

## 2016-05-05 HISTORY — PX: COLONOSCOPY WITH PROPOFOL: SHX5780

## 2016-05-05 SURGERY — COLONOSCOPY WITH PROPOFOL
Anesthesia: General

## 2016-05-05 MED ORDER — SODIUM CHLORIDE 0.9 % IV SOLN
INTRAVENOUS | Status: DC
  Administered 2016-05-05: 1000 mL via INTRAVENOUS

## 2016-05-05 MED ORDER — SODIUM CHLORIDE 0.9 % IV SOLN: INTRAVENOUS | Status: DC

## 2016-05-05 MED ORDER — PROPOFOL 500 MG/50ML IV EMUL
INTRAVENOUS | Status: DC | PRN
  Administered 2016-05-05: 120 ug/kg/min via INTRAVENOUS

## 2016-05-05 MED ORDER — MIDAZOLAM HCL 2 MG/2ML IJ SOLN
INTRAMUSCULAR | Status: DC | PRN
  Administered 2016-05-05: 2 mg via INTRAVENOUS

## 2016-05-05 MED ORDER — FENTANYL CITRATE (PF) 100 MCG/2ML IJ SOLN
INTRAMUSCULAR | Status: DC | PRN
  Administered 2016-05-05 (×2): 50 ug via INTRAVENOUS

## 2016-05-05 MED ORDER — LIDOCAINE HCL (CARDIAC) 20 MG/ML IV SOLN
INTRAVENOUS | Status: DC | PRN
  Administered 2016-05-05: 30 mg via INTRAVENOUS

## 2016-05-05 MED ORDER — EPHEDRINE SULFATE 50 MG/ML IJ SOLN
INTRAMUSCULAR | Status: DC | PRN
  Administered 2016-05-05 (×2): 5 mg via INTRAVENOUS

## 2016-05-05 MED ORDER — GLYCOPYRROLATE 0.2 MG/ML IJ SOLN
INTRAMUSCULAR | Status: DC | PRN
  Administered 2016-05-05: 0.1 mg via INTRAVENOUS

## 2016-05-05 MED ORDER — LIDOCAINE HCL (PF) 1 % IJ SOLN
INTRAMUSCULAR | Status: AC
  Administered 2016-05-05: 2 mL
  Filled 2016-05-05: qty 2

## 2016-05-05 NOTE — Anesthesia Procedure Notes (Signed)
Performed by: COOK-MARTIN, Sharda Keddy Pre-anesthesia Checklist: Patient identified, Emergency Drugs available, Suction available, Patient being monitored and Timeout performed Patient Re-evaluated:Patient Re-evaluated prior to inductionOxygen Delivery Method: Nasal cannula Preoxygenation: Pre-oxygenation with 100% oxygen Intubation Type: IV induction Airway Equipment and Method: Bite block Placement Confirmation: positive ETCO2 and CO2 detector     

## 2016-05-05 NOTE — Anesthesia Preprocedure Evaluation (Signed)
Anesthesia Evaluation  Patient identified by MRN, date of birth, ID band Patient awake    Reviewed: Allergy & Precautions, NPO status , Patient's Chart, lab work & pertinent test results  Airway Mallampati: II       Dental no notable dental hx. (+) Teeth Intact   Pulmonary sleep apnea ,    breath sounds clear to auscultation       Cardiovascular negative cardio ROS   Rhythm:Regular     Neuro/Psych    GI/Hepatic Neg liver ROS, hiatal hernia, GERD  Medicated,  Endo/Other  negative endocrine ROS  Renal/GU      Musculoskeletal   Abdominal Normal abdominal exam  (+)   Peds  Hematology  (+) anemia ,   Anesthesia Other Findings   Reproductive/Obstetrics                             Anesthesia Physical Anesthesia Plan  ASA: II  Anesthesia Plan: General   Post-op Pain Management:    Induction: Intravenous  Airway Management Planned: Natural Airway and Nasal Cannula  Additional Equipment:   Intra-op Plan:   Post-operative Plan:   Informed Consent: I have reviewed the patients History and Physical, chart, labs and discussed the procedure including the risks, benefits and alternatives for the proposed anesthesia with the patient or authorized representative who has indicated his/her understanding and acceptance.     Plan Discussed with: CRNA  Anesthesia Plan Comments:         Anesthesia Quick Evaluation

## 2016-05-05 NOTE — H&P (Signed)
Outpatient short stay form Pre-procedure 05/05/2016 12:01 PM Lollie Sails MD  Primary Physician: Dr. Glendon Axe  Reason for visit:  EGD and colonoscopy  History of present illness:  Patient is a 45 year old female presenting today as above. 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 She has been having some epigastric pain with nausea. She does take EC patters fairly regularly as well as some NSAIDs. She was on a proton pump inhibitor but has not been on that recently. She was also prescribed some Carafate and has not been taking that as well. Further there is a family history of colon polyps. She is presenting today for further evaluation of anemia. This has been iron deficiency. She tolerated her prep well.    Current Facility-Administered Medications:  .  0.9 %  sodium chloride infusion, , Intravenous, Continuous, Lollie Sails, MD, Last Rate: 20 mL/hr at 05/05/16 1133, 1,000 mL at 05/05/16 1133 .  0.9 %  sodium chloride infusion, , Intravenous, Continuous, Lollie Sails, MD  Prescriptions Prior to Admission  Medication Sig Dispense Refill Last Dose  . Cyanocobalamin (RA VITAMIN B-12 TR) 1000 MCG TBCR Take by mouth.   Past Week at Unknown time  . docusate sodium (COLACE) 100 MG capsule Take 100 mg by mouth 2 (two) times daily.   Past Week at Unknown time  . ferrous sulfate 325 (65 FE) MG tablet TK 1 T PO D WITH BRE  11 Past Week at Unknown time  . ibuprofen (MOTRIN IB) 200 MG tablet Take 3 tablets (600 mg total) by mouth every 6 (six) hours as needed. 20 tablet 0 Past Week at Unknown time  . Multiple Vitamin (MULTIVITAMIN) tablet Take 1 tablet by mouth daily.   Past Week at Unknown time  . oxyCODONE-acetaminophen (PERCOCET/ROXICET) 5-325 MG tablet Take by mouth every 4 (four) hours as needed for severe pain.   Past Week at Unknown time  . promethazine (PHENERGAN) 25 MG tablet Take 25 mg by mouth every 6 (six) hours as needed for nausea or vomiting.   Past  Week at Unknown time  . pantoprazole (PROTONIX) 40 MG tablet Take 40 mg by mouth daily.   Not Taking at Unknown time  . sucralfate (CARAFATE) 1 g tablet Take 1 g by mouth 4 (four) times daily -  with meals and at bedtime.   Not Taking at Unknown time  . tamsulosin (FLOMAX) 0.4 MG CAPS capsule Take 0.4 mg by mouth.   Not Taking at Unknown time     No Known Allergies   Past Medical History:  Diagnosis Date  . Acid reflux   . Allergic rhinitis   . Anxiety   . Heart murmur   . History of hiatal hernia   . Sleep apnea     Review of systems:      Physical Exam    Heart and lungs: Regular rate and rhythm without rub or gallop, lungs are bilaterally clear.    HEENT: Septic atraumatic eyes are anicteric    Other:     Pertinant exam for procedure: Soft nontender nondistended bowel sounds positive normoactive.    Planned proceedures: EGD, colonoscopy and indicated procedures. I have discussed the risks benefits and complications of procedures to include not limited to bleeding, infection, perforation and the risk of sedation and the patient wishes to proceed.    Lollie Sails, MD Gastroenterology 05/05/2016  12:01 PM

## 2016-05-05 NOTE — Op Note (Signed)
[facility] Patient Name: Valerie Colon Procedure Date: 05/05/2016 12:01 PM MRN: [id number] Account #: [id number] Date of Birth: Jun 13, 1971 Admit Type: Outpatient Age: 45 Room: [facility] ENDO ROOM 1 Gender: Female Note Status: Finalized Procedure:            Upper GI endoscopy Indications:          Epigastric abdominal pain, Nausea Providers:            Lollie Sails, MD Referring MD:         Glendon Axe (Referring MD) Medicines:            Monitored Anesthesia Care Complications:        No immediate complications. Procedure:            Pre-Anesthesia Assessment:                       - ASA Grade Assessment: III - A patient with severe                        systemic disease.                       After obtaining informed consent, the endoscope was                        passed under direct vision. Throughout the procedure,                        the patient's blood pressure, pulse, and oxygen                        saturations were monitored continuously. The Endoscope                        was introduced through the mouth, and advanced to the                        afferent and efferent jejunal loops. The upper GI                        endoscopy was accomplished without difficulty. The                        patient tolerated the procedure well. Findings:      The Z-line was irregular. Biopsies were taken with a cold forceps for       histology.      Evidence of a gastric bypass was found. A gastric pouch with a 3 cm       length from the GE junction to the gastrojejunal anastomosis was found.       The gastrojejunal anastomosis was characterized by healthy appearing       mucosa and minimal erythema with a very tiny erosion. The gastric mucosa       proximal to the Gastrojejunal anastomosis is minimally erythematous. The       afferent and afferent limbs were intubated for a short distance and       noted to be normal. Biopsies taken for  histology and H pylori.      LA Grade B (one or more mucosal breaks greater than 5 mm, not extending  between the tops of two mucosal folds) esophagitis with no bleeding was       found. Impression:           - Z-line irregular. Biopsied.                       - Gastric bypass with a pouch 3 cm in length.                        Gastrojejunal anastomosis characterized by healthy                        appearing mucosa and erythema. Recommendation:       - Await pathology results.                       - Use Protonix (pantoprazole) 40 mg PO daily daily.                       - Use sucralfate tablets 1 gram PO QID daily.                       - Return to GI clinic in 1 month. Procedure Code(s):    --- Professional ---                       [phone], Esophagogastroduodenoscopy, flexible, transoral;                        with biopsy, single or multiple Diagnosis Code(s):    --- Professional ---                       K22.8, Other specified diseases of esophagus                       Z98.84, Bariatric surgery status                       R10.13, Epigastric pain                       R11.0, Nausea CPT copyright 2016 American Medical Association. All rights reserved. The codes documented in this report are preliminary and upon coder review may  be revised to meet current compliance requirements. Lollie Sails, MD 05/05/2016 12:30:19 PM This report has been signed electronically. Number of Addenda: 0 Note Initiated On: 05/05/2016 12:01 PM      [facility]

## 2016-05-05 NOTE — Op Note (Signed)
[facility] Gastroenterology Patient Name: Valerie Colon Procedure Date: 05/05/2016 12:00 PM MRN: [id number] Account #: [id number] Date of Birth: 1971-02-19 Admit Type: Outpatient Age: 45 Room: [facility] ENDO ROOM 1 Gender: Female Note Status: Finalized Procedure:            Colonoscopy Indications:          Family history of colonic polyps in a first-degree                        relative Providers:            Lollie Sails, MD Referring MD:         Glendon Axe (Referring MD) Medicines:            Monitored Anesthesia Care Complications:        No immediate complications. Procedure:            Pre-Anesthesia Assessment:                       - ASA Grade Assessment: III - A patient with severe                        systemic disease.                       After obtaining informed consent, the colonoscope was                        passed under direct vision. Throughout the procedure,                        the patient's blood pressure, pulse, and oxygen                        saturations were monitored continuously. The                        Colonoscope was introduced through the anus and                        advanced to the the cecum, identified by appendiceal                        orifice and ileocecal valve. The colonoscopy was                        performed without difficulty. The patient tolerated the                        procedure well. The quality of the bowel preparation                        was good. Findings:      Non-bleeding internal hemorrhoids were found during retroflexion and       during anoscopy. The hemorrhoids were small and Grade I (internal       hemorrhoids that do not prolapse).      The colon (entire examined portion) appeared normal.      The digital rectal exam was normal.      No additional abnormalities were found on retroflexion. Impression:           -  Non-bleeding internal hemorrhoids.                       - The  entire examined colon is normal.                       - No specimens collected. Recommendation:       - Discharge patient to home.                       - Repeat colonoscopy in 5 years for screening purposes. Procedure Code(s):    --- Professional ---                       [phone], Colonoscopy, flexible; diagnostic, including                        collection of specimen(s) by brushing or washing, when                        performed (separate procedure) Diagnosis Code(s):    --- Professional ---                       K64.0, First degree hemorrhoids                       Z83.71, Family history of colonic polyps CPT copyright 2016 American Medical Association. All rights reserved. The codes documented in this report are preliminary and upon coder review may  be revised to meet current compliance requirements. Lollie Sails, MD 05/05/2016 12:55:54 PM This report has been signed electronically. Number of Addenda: 0 Note Initiated On: 05/05/2016 12:00 PM Scope Withdrawal Time: 0 hours 8 minutes 38 seconds  Total Procedure Duration: 0 hours 18 minutes 11 seconds       [facility]

## 2016-05-05 NOTE — Transfer of Care (Signed)
Immediate Anesthesia Transfer of Care Note  Patient: Valerie Colon  Procedure(s) Performed: Procedure(s): COLONOSCOPY WITH PROPOFOL (N/A) ESOPHAGOGASTRODUODENOSCOPY (EGD) WITH PROPOFOL (N/A)  Patient Location: PACU  Anesthesia Type:General  Level of Consciousness: awake, oriented and sedated  Airway & Oxygen Therapy: Patient Spontanous Breathing and Patient connected to nasal cannula oxygen  Post-op Assessment: Report given to RN and Post -op Vital signs reviewed and stable  Post vital signs: Reviewed and stable  Last Vitals:  Vitals:   05/05/16 1112 05/05/16 1258  BP: 115/76 130/85  Pulse: 72 74  Resp: 18   Temp: (!) 35.8 C     Last Pain:  Vitals:   05/05/16 1112  TempSrc: Tympanic         Complications: No apparent anesthesia complications

## 2016-05-06 ENCOUNTER — Encounter: Payer: Self-pay | Admitting: Gastroenterology

## 2016-05-07 LAB — SURGICAL PATHOLOGY

## 2016-05-07 NOTE — Anesthesia Postprocedure Evaluation (Signed)
Anesthesia Post Note  Patient: Valerie Colon  Procedure(s) Performed: Procedure(s) (LRB): COLONOSCOPY WITH PROPOFOL (N/A) ESOPHAGOGASTRODUODENOSCOPY (EGD) WITH PROPOFOL (N/A)  Patient location during evaluation: PACU Anesthesia Type: General Level of consciousness: awake Pain management: pain level controlled Vital Signs Assessment: post-procedure vital signs reviewed and stable Respiratory status: spontaneous breathing Cardiovascular status: stable Anesthetic complications: no    Last Vitals:  Vitals:   05/05/16 1318 05/05/16 1328  BP: 110/82 116/66  Pulse: 61 (!) 59  Resp: 13 12  Temp:      Last Pain:  Vitals:   05/06/16 0724  TempSrc:   PainSc: 0-No pain                 [name],Valerie Curenton

## 2016-05-07 NOTE — Anesthesia Postprocedure Evaluation (Signed)
Anesthesia Post Note  Patient: Valerie Colon  Procedure(s) Performed: Procedure(s) (LRB): COLONOSCOPY WITH PROPOFOL (N/A) ESOPHAGOGASTRODUODENOSCOPY (EGD) WITH PROPOFOL (N/A)  Patient location during evaluation: PACU Anesthesia Type: General Level of consciousness: awake Pain management: pain level controlled Vital Signs Assessment: post-procedure vital signs reviewed and stable Respiratory status: nonlabored ventilation Cardiovascular status: stable Anesthetic complications: no    Last Vitals:  Vitals:   05/05/16 1318 05/05/16 1328  BP: 110/82 116/66  Pulse: 61 (!) 59  Resp: 13 12  Temp:      Last Pain:  Vitals:   05/06/16 0724  TempSrc:   PainSc: 0-No pain                 VAN STAVEREN,Quinterrius Errington

## 2016-07-29 ENCOUNTER — Encounter: Payer: Self-pay | Admitting: Urology

## 2016-07-29 ENCOUNTER — Ambulatory Visit
Admission: RE | Admit: 2016-07-29 | Discharge: 2016-07-29 | Disposition: A | Discharge status: Home / Self Care | Payer: BLUE CROSS/BLUE SHIELD | Source: Ambulatory Visit | Attending: Urology | Admitting: Urology

## 2016-07-29 ENCOUNTER — Ambulatory Visit (INDEPENDENT_AMBULATORY_CARE_PROVIDER_SITE_OTHER): Payer: BLUE CROSS/BLUE SHIELD | Admitting: Urology

## 2016-07-29 ENCOUNTER — Other Ambulatory Visit: Payer: Self-pay

## 2016-07-29 ENCOUNTER — Other Ambulatory Visit: Payer: Self-pay | Admitting: Family Medicine

## 2016-07-29 VITALS — Ht 66.0 in | Wt 181.9 lb

## 2016-07-29 DIAGNOSIS — R109 Unspecified abdominal pain: Secondary | ICD-10-CM | POA: Insufficient documentation

## 2016-07-29 DIAGNOSIS — N200 Calculus of kidney: Secondary | ICD-10-CM

## 2016-07-29 LAB — MICROSCOPIC EXAMINATION

## 2016-07-29 LAB — URINALYSIS, COMPLETE
BILIRUBIN UA: NEGATIVE
KETONES UA: NEGATIVE
Leukocytes, UA: NEGATIVE
Nitrite, UA: NEGATIVE
PH UA: 5 (ref 5.0–7.5)
PROTEIN UA: NEGATIVE
RBC UA: NEGATIVE
UUROB: 0.2 mg/dL (ref 0.2–1.0)

## 2016-07-29 NOTE — Progress Notes (Signed)
07/29/2016 2:47 PM   Valerie Colon August 24, 1970 [id number]  Referring provider: Glendon Axe, MD [facility] [place], [place] [address]  Chief Complaint  Patient presents with  . Follow-up    possible stone    HPI: Patient is a 45 yo [place] American female who presents today for an urgent appointment for a possible kidney stone.    She awoke this am with an achy feeling on the left side/waist.  Yesterday, she was having left flank discomfort.  The pain was at a 6 this am.  She was seen at [facility] urgent care this am.  UA was unremarkable.  She has not had dysuria or gross hematuria.  She has not had fevers, chills, nausea or vomiting.    Her pain is now at a 2.   KUB taken today noted stable calculus lower pole right kidney. Phleboliths in pelvis. No new calcifications evident. Bowel gas pattern unremarkable.  I have independently reviewed the films.    Her UA is unremarkable.    Patient has had several stones.  Her first stone was 5 years ago. She had gastric bypass 5 years ago. She has never needed surgery for stones.  24 hour urine metabolic studies were ordered but not completed.  She does to her best to drink plenty of water.  PMH: Past Medical History:  Diagnosis Date  . Acid reflux   . Allergic rhinitis   . Anxiety   . Heart murmur   . History of hiatal hernia   . Sleep apnea     Surgical History: Past Surgical History:  Procedure Laterality Date  . ABDOMINAL HYSTERECTOMY    . BREAST BIOPSY Right 12/19/2013   stereo right  . CHOLECYSTECTOMY    . COLONOSCOPY WITH PROPOFOL N/A 05/05/2016   Procedure: COLONOSCOPY WITH PROPOFOL;  Surgeon: Lollie Sails, MD;  Location: [facility] ENDOSCOPY;  Service: Endoscopy;  Laterality: N/A;  . ESOPHAGOGASTRODUODENOSCOPY (EGD) WITH PROPOFOL N/A 05/05/2016   Procedure: ESOPHAGOGASTRODUODENOSCOPY (EGD) WITH PROPOFOL;  Surgeon: Lollie Sails, MD;  Location: [facility] ENDOSCOPY;  Service: Endoscopy;   Laterality: N/A;  . gallbladder  2012  . GASTRIC BYPASS  2012  . REDUCTION MAMMAPLASTY Bilateral    2010  . REDUCTION MAMMAPLASTY      Home Medications:    Medication List       Accurate as of 07/29/16  2:47 PM. Always use your most recent med list.          docusate sodium 100 MG capsule Commonly known as:  COLACE Take 100 mg by mouth 2 (two) times daily.   ferrous sulfate 325 (65 FE) MG tablet TK 1 T PO D WITH BRE   ibuprofen 200 MG tablet Commonly known as:  MOTRIN IB Take 3 tablets (600 mg total) by mouth every 6 (six) hours as needed.   multivitamin tablet Take 1 tablet by mouth daily.   oxyCODONE-acetaminophen 5-325 MG tablet Commonly known as:  PERCOCET/ROXICET Take by mouth every 4 (four) hours as needed for severe pain.   pantoprazole 40 MG tablet Commonly known as:  PROTONIX Take 40 mg by mouth daily.   promethazine 25 MG tablet Commonly known as:  PHENERGAN Take 25 mg by mouth every 6 (six) hours as needed for nausea or vomiting.   RA VITAMIN B-12 TR 1000 MCG Tbcr Generic drug:  Cyanocobalamin Take by mouth.   sucralfate 1 g tablet Commonly known as:  CARAFATE Take 1 g by mouth 4 (four) times  daily -  with meals and at bedtime.   tamsulosin 0.4 MG Caps capsule Commonly known as:  FLOMAX Take 0.4 mg by mouth.       Allergies: No Known Allergies  Family History: Family History  Problem Relation Age of Onset  . Prostate cancer Father   . Kidney Stones Maternal Grandfather   . Kidney disease Neg Hx   . Bladder Cancer Neg Hx     Social History:  reports that she has never smoked. She has never used smokeless tobacco. She reports that she does not drink alcohol or use drugs.  ROS: UROLOGY Frequent Urination?: No Hard to postpone urination?: No Burning/pain with urination?: No Get up at night to urinate?: No Leakage of urine?: No Urine stream starts and stops?: No Trouble starting stream?: No Do you have to strain to urinate?:  No Blood in urine?: No Urinary tract infection?: No Sexually transmitted disease?: No Injury to kidneys or bladder?: No Painful intercourse?: No Weak stream?: No Currently pregnant?: No Vaginal bleeding?: No Last menstrual period?: n  Physical Exam: Ht 5\' 6"  (1.676 m)   Wt 181 lb 14.4 oz (82.5 kg)   LMP  (LMP Unknown)   BMI 29.36 kg/m   Constitutional:  Alert and oriented, No acute distress. HEENT: [place] AT, moist mucus membranes.  Trachea midline, no masses. Cardiovascular: No clubbing, cyanosis, or edema. Respiratory: Normal respiratory effort, no increased work of breathing. GI: Abdomen is soft, nontender, nondistended, no abdominal masses GU: No CVA tenderness.  Skin: No rashes, bruises or suspicious lesions. Neurologic: Grossly intact, no focal deficits, moving all 4 extremities. Psychiatric: Normal mood and affect.  Laboratory Data: Lab Results  Component Value Date   HGB 13.7 11/06/2008    Lab Results  Component Value Date   CREATININE 0.86 11/03/2008    Urinalysis Unremarkable.  See EPIC.    Pertinent Imaging: CLINICAL DATA:  Left-sided flank pain  EXAM: ABDOMEN - 1 VIEW  COMPARISON:  April 11, 2016  FINDINGS: There is again noted a 5 mm calculus in the lower pole right kidney region. There are persistent phleboliths in the pelvis. No new calcifications are evident. There is moderate stool in the colon. There is no bowel dilatation or air-fluid levels suggesting bowel obstruction. No free air. Visualized lung bases are clear.  IMPRESSION: Stable calculus lower pole right kidney. Phleboliths in pelvis. No new calcifications evident. Bowel gas pattern unremarkable.   Electronically Signed   By: Lowella Grip III M.D.   On: 07/29/2016 13:01  Assessment & Plan:    1. Left flank pain  - no obvious ureteral stones seen on today's KUB  - obtain a CT Renal stone study - I will contact the patient with the CT results  - Advised to contact  our office or seek treatment in the ED if becomes febrile or pain/ vomiting are difficult control in order to arrange for emergent/urgent intervention  2. Nephrolithiasis  - Small nonobstructing right renal stone, otherwise no significant stone burden. No evidence of obstructing stone. Currently asymptomatic.  - reviewed general stone prevention techniques including drinking plenty water with goal of producing 2.5 L urine daily, increased citric acid intake, avoidance of high oxalate containing foods, and decreased salt intake.    - Option for metabolic workup reviewed again today. The patient would prefer to defer this at this time.  Return for I will call patient with results.  Zara Council, [facility] [address], [place], [place]  [address] [phone]   

## 2016-07-30 ENCOUNTER — Telehealth: Payer: Self-pay

## 2016-07-30 ENCOUNTER — Ambulatory Visit
Admission: RE | Admit: 2016-07-30 | Discharge: 2016-07-30 | Disposition: A | Discharge status: Home / Self Care | Payer: BLUE CROSS/BLUE SHIELD | Source: Ambulatory Visit | Attending: Urology | Admitting: Urology

## 2016-07-30 DIAGNOSIS — N200 Calculus of kidney: Secondary | ICD-10-CM | POA: Diagnosis not present

## 2016-07-30 DIAGNOSIS — R109 Unspecified abdominal pain: Secondary | ICD-10-CM

## 2016-07-30 DIAGNOSIS — Z9884 Bariatric surgery status: Secondary | ICD-10-CM | POA: Insufficient documentation

## 2016-07-30 DIAGNOSIS — Z90710 Acquired absence of both cervix and uterus: Secondary | ICD-10-CM | POA: Diagnosis not present

## 2016-07-30 NOTE — Telephone Encounter (Signed)
Spoke with pt in reference to CT results. Pt voiced understanding.  

## 2016-07-30 NOTE — Telephone Encounter (Signed)
-----   Message from Nori Riis, PA-C sent at 07/30/2016  1:29 PM EST ----- Please notify the patient that her CT scan noted stones in each kidney.  She did not have any in her ureters, so she either passed a stone or her pain is from something else.  If she should experience flank pain that is uncontrolled, high fevers or vomiting that will not stop, she needs to seek evaluation in the ED.

## 2016-07-31 ENCOUNTER — Ambulatory Visit: Payer: BLUE CROSS/BLUE SHIELD | Admitting: Urology

## 2016-07-31 LAB — CULTURE, URINE COMPREHENSIVE

## 2016-08-01 ENCOUNTER — Telehealth: Payer: Self-pay

## 2016-08-01 NOTE — Telephone Encounter (Signed)
Spoke with pt in reference to ucx results. Pt voiced understanding.  

## 2016-08-01 NOTE — Telephone Encounter (Signed)
-----   Message from Nori Riis, PA-C sent at 08/01/2016  8:12 AM EST ----- Please let the patient know that her urine culture was negative.

## 2016-09-24 DIAGNOSIS — J301 Allergic rhinitis due to pollen: Secondary | ICD-10-CM | POA: Diagnosis not present

## 2016-09-24 DIAGNOSIS — H6980 Other specified disorders of Eustachian tube, unspecified ear: Secondary | ICD-10-CM | POA: Diagnosis not present

## 2016-10-07 DIAGNOSIS — J301 Allergic rhinitis due to pollen: Secondary | ICD-10-CM | POA: Diagnosis not present

## 2016-10-21 DIAGNOSIS — M7582 Other shoulder lesions, left shoulder: Secondary | ICD-10-CM | POA: Diagnosis not present

## 2016-10-27 ENCOUNTER — Encounter: Payer: Self-pay | Admitting: Emergency Medicine

## 2016-10-27 ENCOUNTER — Emergency Department: Payer: BLUE CROSS/BLUE SHIELD

## 2016-10-27 ENCOUNTER — Emergency Department
Admission: EM | Admit: 2016-10-27 | Discharge: 2016-10-27 | Disposition: A | Discharge status: Home / Self Care | Payer: BLUE CROSS/BLUE SHIELD | Attending: Emergency Medicine | Admitting: Emergency Medicine

## 2016-10-27 DIAGNOSIS — N200 Calculus of kidney: Secondary | ICD-10-CM

## 2016-10-27 DIAGNOSIS — N132 Hydronephrosis with renal and ureteral calculous obstruction: Secondary | ICD-10-CM | POA: Diagnosis not present

## 2016-10-27 DIAGNOSIS — R1030 Lower abdominal pain, unspecified: Secondary | ICD-10-CM | POA: Diagnosis present

## 2016-10-27 DIAGNOSIS — Z791 Long term (current) use of non-steroidal anti-inflammatories (NSAID): Secondary | ICD-10-CM | POA: Insufficient documentation

## 2016-10-27 DIAGNOSIS — N202 Calculus of kidney with calculus of ureter: Secondary | ICD-10-CM | POA: Diagnosis not present

## 2016-10-27 LAB — COMPREHENSIVE METABOLIC PANEL
ALT: 14 U/L (ref 14–54)
AST: 19 U/L (ref 15–41)
Albumin: 3.4 g/dL — ABNORMAL LOW (ref 3.5–5.0)
Alkaline Phosphatase: 59 U/L (ref 38–126)
Anion gap: 4 — ABNORMAL LOW (ref 5–15)
BILIRUBIN TOTAL: 0.5 mg/dL (ref 0.3–1.2)
BUN: 14 mg/dL (ref 6–20)
CHLORIDE: 108 mmol/L (ref 101–111)
CO2: 26 mmol/L (ref 22–32)
CREATININE: 0.9 mg/dL (ref 0.44–1.00)
Calcium: 7.9 mg/dL — ABNORMAL LOW (ref 8.9–10.3)
Glucose, Bld: 139 mg/dL — ABNORMAL HIGH (ref 65–99)
Potassium: 3.5 mmol/L (ref 3.5–5.1)
Sodium: 138 mmol/L (ref 135–145)
TOTAL PROTEIN: 7.3 g/dL (ref 6.5–8.1)

## 2016-10-27 LAB — CBC WITH DIFFERENTIAL/PLATELET
Basophils Absolute: 0 10*3/uL (ref 0–0.1)
Basophils Relative: 0 %
EOS PCT: 1 %
Eosinophils Absolute: 0.1 10*3/uL (ref 0–0.7)
HEMATOCRIT: 36.2 % (ref 35.0–47.0)
Hemoglobin: 12.1 g/dL (ref 12.0–16.0)
LYMPHS ABS: 2.9 10*3/uL (ref 1.0–3.6)
LYMPHS PCT: 36 %
MCH: 30.8 pg (ref 26.0–34.0)
MCHC: 33.3 g/dL (ref 32.0–36.0)
MCV: 92.3 fL (ref 80.0–100.0)
MONO ABS: 0.7 10*3/uL (ref 0.2–0.9)
Monocytes Relative: 9 %
NEUTROS ABS: 4.3 10*3/uL (ref 1.4–6.5)
Neutrophils Relative %: 54 %
PLATELETS: 332 10*3/uL (ref 150–440)
RBC: 3.93 MIL/uL (ref 3.80–5.20)
RDW: 14.2 % (ref 11.5–14.5)
WBC: 7.9 10*3/uL (ref 3.6–11.0)

## 2016-10-27 LAB — URINALYSIS, COMPLETE (UACMP) WITH MICROSCOPIC
Bilirubin Urine: NEGATIVE
GLUCOSE, UA: 50 mg/dL — AB
Ketones, ur: NEGATIVE mg/dL
Leukocytes, UA: NEGATIVE
NITRITE: NEGATIVE
PH: 5 (ref 5.0–8.0)
Protein, ur: NEGATIVE mg/dL
SPECIFIC GRAVITY, URINE: 1.023 (ref 1.005–1.030)

## 2016-10-27 LAB — LIPASE, BLOOD: LIPASE: 36 U/L (ref 11–51)

## 2016-10-27 MED ORDER — OXYCODONE-ACETAMINOPHEN 5-325 MG PO TABS: 1.0000 | ORAL_TABLET | Freq: Four times a day (QID) | ORAL | 0 refills | Status: DC | PRN

## 2016-10-27 MED ORDER — ONDANSETRON 4 MG PO TBDP: 4.0000 mg | ORAL_TABLET | Freq: Three times a day (TID) | ORAL | 0 refills | Status: DC | PRN

## 2016-10-27 MED ORDER — TAMSULOSIN HCL 0.4 MG PO CAPS: 0.4000 mg | ORAL_CAPSULE | Freq: Every day | ORAL | 0 refills | Status: DC

## 2016-10-27 MED ORDER — SODIUM CHLORIDE 0.9 % IV BOLUS (SEPSIS)
1000.0000 mL | Freq: Once | INTRAVENOUS | Status: AC
  Administered 2016-10-27: 1000 mL via INTRAVENOUS

## 2016-10-27 MED ORDER — KETOROLAC TROMETHAMINE 30 MG/ML IJ SOLN
30.0000 mg | Freq: Once | INTRAMUSCULAR | Status: AC
  Administered 2016-10-27: 30 mg via INTRAVENOUS
  Filled 2016-10-27: qty 1

## 2016-10-27 MED ORDER — ONDANSETRON HCL 4 MG/2ML IJ SOLN
4.0000 mg | Freq: Once | INTRAMUSCULAR | Status: AC
  Administered 2016-10-27: 4 mg via INTRAVENOUS
  Filled 2016-10-27: qty 2

## 2016-10-27 MED ORDER — MORPHINE SULFATE (PF) 4 MG/ML IV SOLN
4.0000 mg | Freq: Once | INTRAVENOUS | Status: AC
  Administered 2016-10-27: 4 mg via INTRAVENOUS
  Filled 2016-10-27: qty 1

## 2016-10-27 NOTE — Discharge Instructions (Signed)
Please follow-up with urology if you have any persistent or worsening symptoms. Please return to the emergency Department with any other concerns.

## 2016-10-27 NOTE — ED Provider Notes (Signed)
[facility] Emergency Department Provider Note   ____________________________________________   First MD Initiated Contact with Patient 10/27/16 [phone]     (approximate)  I have reviewed the triage vital signs and the nursing notes.   HISTORY  Chief Complaint Abdominal Pain    HPI Valerie Colon is a 46 y.o. female whocomes into the hospital today with some left-sided abdominal pain. She reports that for the last week she's felt it coming on and thinks it may be a kidney stone. She was in some pain yesterday in her lower abdomen as well as lower back but reports that tonight it became severely worse. The patient has been taking ibuprofen for pain at home. The patient does have a history of kidney stones and states that it feels just like this. The patient reports her pain is 8 out of 10 in intensity. She's had some nausea with no vomiting no fever and no chills. The patient reports she's had pain with urination but denies any hematuria. She is here today for evaluation.   Past Medical History:  Diagnosis Date  . Acid reflux   . Allergic rhinitis   . Anxiety   . Heart murmur   . History of hiatal hernia   . Sleep apnea     Patient Active Problem List   Diagnosis Date Noted  . Nephrolithiasis 02/29/2016  . Chronic fatigue 01/24/2016  . Dizziness 01/24/2016  . Gastroesophageal reflux disease without esophagitis 01/24/2016  . GAD (generalized anxiety disorder) 11/20/2015  . Emotional stress 11/09/2015  . Intermittent palpitations 11/09/2015  . Vitamin D deficiency 07/09/2015  . Allergic rhinitis 03/29/2015  . Chronic anemia 01/08/2015  . Prediabetes 01/08/2015  . Obstructive apnea 01/20/2014  . Hypoglycemia after GI (gastrointestinal) surgery 11/26/2012    Past Surgical History:  Procedure Laterality Date  . ABDOMINAL HYSTERECTOMY    . BREAST BIOPSY Right 12/19/2013   stereo right  . CHOLECYSTECTOMY    . COLONOSCOPY WITH PROPOFOL N/A  05/05/2016   Procedure: COLONOSCOPY WITH PROPOFOL;  Surgeon: Lollie Sails, MD;  Location: [facility] ENDOSCOPY;  Service: Endoscopy;  Laterality: N/A;  . ESOPHAGOGASTRODUODENOSCOPY (EGD) WITH PROPOFOL N/A 05/05/2016   Procedure: ESOPHAGOGASTRODUODENOSCOPY (EGD) WITH PROPOFOL;  Surgeon: Lollie Sails, MD;  Location: [facility] ENDOSCOPY;  Service: Endoscopy;  Laterality: N/A;  . gallbladder  2012  . GASTRIC BYPASS  2012  . REDUCTION MAMMAPLASTY Bilateral    2010  . REDUCTION MAMMAPLASTY      Prior to Admission medications   Medication Sig Start Date End Date Taking? Authorizing Provider  Cyanocobalamin (RA VITAMIN B-12 TR) 1000 MCG TBCR Take by mouth.    Historical Provider, MD  docusate sodium (COLACE) 100 MG capsule Take 100 mg by mouth 2 (two) times daily.    Historical Provider, MD  ferrous sulfate 325 (65 FE) MG tablet TK 1 T PO D WITH BRE 01/26/15   Historical Provider, MD  ibuprofen (MOTRIN IB) 200 MG tablet Take 3 tablets (600 mg total) by mouth every 6 (six) hours as needed. 03/29/16   Schuyler Amor, MD  Multiple Vitamin (MULTIVITAMIN) tablet Take 1 tablet by mouth daily.    Historical Provider, MD  ondansetron (ZOFRAN ODT) 4 MG disintegrating tablet Take 1 tablet (4 mg total) by mouth every 8 (eight) hours as needed for nausea or vomiting. 10/27/16   Loney Hering, MD  oxyCODONE-acetaminophen (PERCOCET/ROXICET) 5-325 MG tablet Take by mouth every 4 (four) hours as needed for severe pain.    Historical  Provider, MD  oxyCODONE-acetaminophen (ROXICET) 5-325 MG tablet Take 1 tablet by mouth every 6 (six) hours as needed. 10/27/16   Loney Hering, MD  pantoprazole (PROTONIX) 40 MG tablet Take 40 mg by mouth daily.    Historical Provider, MD  promethazine (PHENERGAN) 25 MG tablet Take 25 mg by mouth every 6 (six) hours as needed for nausea or vomiting.    Historical Provider, MD  sucralfate (CARAFATE) 1 g tablet Take 1 g by mouth 4 (four) times daily -  with meals and at bedtime.     Historical Provider, MD  tamsulosin (FLOMAX) 0.4 MG CAPS capsule Take 0.4 mg by mouth.    Historical Provider, MD  tamsulosin (FLOMAX) 0.4 MG CAPS capsule Take 1 capsule (0.4 mg total) by mouth daily. 10/27/16   Loney Hering, MD    Allergies Patient has no known allergies.  Family History  Problem Relation Age of Onset  . Prostate cancer Father   . Kidney Stones Maternal Grandfather   . Kidney disease Neg Hx   . Bladder Cancer Neg Hx     Social History Social History  Substance Use Topics  . Smoking status: Never Smoker  . Smokeless tobacco: Never Used  . Alcohol use No    Review of Systems Constitutional: No fever/chills Eyes: No visual changes. ENT: No sore throat. Cardiovascular: Denies chest pain. Respiratory: Denies shortness of breath. Gastrointestinal:  abdominal pain.   nausea, no vomiting.  No diarrhea.  No constipation. Genitourinary:  dysuria. Musculoskeletal: back pain. Skin: Negative for rash. Neurological: Negative for headaches, focal weakness or numbness.  10-point ROS otherwise negative.  ____________________________________________   PHYSICAL EXAM:  VITAL SIGNS: ED Triage Vitals  Enc Vitals Group     BP 10/27/16 0515 117/87     Pulse Rate 10/27/16 0515 63     Resp 10/27/16 0515 20     Temp 10/27/16 0515 98.2 F (36.8 C)     Temp Source 10/27/16 0515 Oral     SpO2 10/27/16 0515 100 %     Weight 10/27/16 0516 170 lb (77.1 kg)     Height 10/27/16 0516 5\' 6"  (1.676 m)     Head Circumference --      Peak Flow --      Pain Score 10/27/16 0516 8     Pain Loc --      Pain Edu? --      Excl. in [place]? --     Constitutional: Alert and oriented. Well appearing and in mild distress. Eyes: Conjunctivae are normal. PERRL. EOMI. Head: Atraumatic. Nose: No congestion/rhinnorhea. Mouth/Throat: Mucous membranes are moist.  Oropharynx non-erythematous. Cardiovascular: Normal rate, regular rhythm. Grossly normal heart sounds.  Good peripheral  circulation. Respiratory: Normal respiratory effort.  No retractions. Lungs CTAB. Gastrointestinal: Soft with left lower abd pain. No distention. Positive bowel sounds mild left flank pain Musculoskeletal: No lower extremity tenderness nor edema.   Neurologic:  Normal speech and language.  Skin:  Skin is warm, dry and intact.  Psychiatric: Mood and affect are normal.   ____________________________________________   LABS (all labs ordered are listed, but only abnormal results are displayed)  Labs Reviewed  COMPREHENSIVE METABOLIC PANEL - Abnormal; Notable for the following:       Result Value   Glucose, Bld 139 (*)    Calcium 7.9 (*)    Albumin 3.4 (*)    Anion gap 4 (*)    All other components within normal limits  URINALYSIS, COMPLETE (UACMP) WITH MICROSCOPIC -  Abnormal; Notable for the following:    Color, Urine YELLOW (*)    APPearance CLEAR (*)    Glucose, UA 50 (*)    Hgb urine dipstick LARGE (*)    Bacteria, UA RARE (*)    Squamous Epithelial / LPF 0-5 (*)    All other components within normal limits  LIPASE, BLOOD  CBC WITH DIFFERENTIAL/PLATELET   ____________________________________________  EKG  none ____________________________________________  RADIOLOGY  CT renal stone study ____________________________________________   PROCEDURES  Procedure(s) performed: None  Procedures  Critical Care performed: No  ____________________________________________   INITIAL IMPRESSION / ASSESSMENT AND PLAN / ED COURSE  Pertinent labs & imaging results that were available during my care of the patient were reviewed by me and considered in my medical decision making (see chart for details).  This is a 46 year old female who comes into the hospital today with some lower abdominal and some left flank pain. The patient does have a history of kidney stones. I will give the patient dose of morphine, Zofran, a liter of normal saline and I will send her for a CT scan  looking for possible kidney stone.  Clinical Course as of Oct 27 744  Mon Oct 27, 2016  0742 4.3 mm distal left ureteral obstructing stone located 1 cm proximal to the left ureteral vesical junction causing moderate left-sided hydroureteronephrosis.  Two tiny nonobstructing left renal calculi. Two nonobstructing right renal calculi, largest lower pole region measuring up to 6.8 mm. Right upper pole 1 cm cyst.  Portions of bowel under distended and evaluation limited however no extraluminal bowel inflammatory process or free air noted.  Post gastric bypass, cholecystectomy and hysterectomy.   CT Renal Laren Everts [AW]    Clinical Course User Index [AW] Loney Hering, MD   The patient does appear to have a kidney stone in her left UVJ. Her pain is improved after the morphine. I will give her a dose of Toradol and I will discharge her home with some medication and follow-up with urology.  ____________________________________________   FINAL CLINICAL IMPRESSION(S) / ED DIAGNOSES  Final diagnoses:  Kidney stone      NEW MEDICATIONS STARTED DURING THIS VISIT:  New Prescriptions   ONDANSETRON (ZOFRAN ODT) 4 MG DISINTEGRATING TABLET    Take 1 tablet (4 mg total) by mouth every 8 (eight) hours as needed for nausea or vomiting.   OXYCODONE-ACETAMINOPHEN (ROXICET) 5-325 MG TABLET    Take 1 tablet by mouth every 6 (six) hours as needed.   TAMSULOSIN (FLOMAX) 0.4 MG CAPS CAPSULE    Take 1 capsule (0.4 mg total) by mouth daily.     Note:  This document was prepared using Dragon voice recognition software and may include unintentional dictation errors.    Loney Hering, MD 10/27/16 [phone]

## 2016-10-27 NOTE — ED Notes (Signed)
AAOx3.  Skin warm and dry.  NAD 

## 2016-10-27 NOTE — ED Notes (Signed)
AAOx3.  Skin warm and dry.  Toradol given.  Med hold then discharge.

## 2016-10-27 NOTE — ED Triage Notes (Signed)
Patient ambulatory to triage with complaints of left lower abdominal pain, 8/10. Since thursday with sharp increase in pain today.  Pt reports hx of kidney stones x 4 without surgical resolution.  Now constant Sharp in left groin indicated.  Pt reports taking IBU 600mg  at 1800 yesterday.  Nausea. Speaking in complete coherent sentences but grunting from pain.

## 2016-10-27 NOTE — ED Notes (Addendum)
Pt c/o left lower abd pain; thinks she may have a kidney stone; history of same and say this feels similar; pt to be taken to room 26 via wheelchair by ED tech Beth for triage and evaluation; called to NOel, RN and he is expecting pt in room

## 2016-10-27 NOTE — ED Notes (Signed)
Patient transported to CT 

## 2016-10-29 NOTE — Progress Notes (Signed)
10/30/2016 1:32 PM   Valerie Colon [birth year] [id number]  Referring provider: Glendon Axe, MD [place] [address] [facility] [place], [place] [address]  Chief Complaint  Patient presents with  . Nephrolithiasis    HPI: 46 yo AAF who presents today as a follow up from [facility] ED for a left ureteral stone.  Patient states the onset of the pain was one week ago.   The pain has been becoming increasingly worse since its onset.  The pain was located left side of the abdomen and radiated to lower back.    The pain was a 8/10.  Nothing made the pain better.   Nothing made the pain worse.  She did have nausea, but she denied gross hematuria, fevers, chills and vomiting.  In the ED, she received Percocet, tamsulosin and Zofran.  Her UA was positive for TNTC RBC's.  Her serum creatinine was 0.9.  Her WBC count was 7.9.    CT renal stone study noted a 4.3 mm distal left ureteral obstructing stone located 1 cm proximal to the left ureteral vesical junction causing moderate left-sided hydroureteronephrosis. Two tiny nonobstructing left renal calculi. Two nonobstructing right renal calculi, largest lower pole region measuring up to 6.8 mm.  Right upper pole 1 cm cyst.  Portions of bowel under distended and evaluation limited however no extraluminal bowel inflammatory process or free air noted.  Post gastric bypass, cholecystectomy and hysterectomy. I have independently reviewed the films.  Today, she has not passed a fragment. She is having lower left quadrant pain. She describes the pain as negative. On a scale of 1-10 it is a  4.   Her UA today demonstrates was unremarkable.   She denies dysuria, fevers, chills, nausea and vomiting.    She does have a prior history of stones.    PMH: Past Medical History:  Diagnosis Date  . Acid reflux   . Allergic rhinitis   . Anxiety   . Heart murmur   . History of hiatal hernia   . Sleep apnea     Surgical History: Past Surgical  History:  Procedure Laterality Date  . ABDOMINAL HYSTERECTOMY    . BREAST BIOPSY Right 12/19/2013   stereo right  . CHOLECYSTECTOMY    . COLONOSCOPY WITH PROPOFOL N/A 05/05/2016   Procedure: COLONOSCOPY WITH PROPOFOL;  Surgeon: Lollie Sails, MD;  Location: [facility] ENDOSCOPY;  Service: Endoscopy;  Laterality: N/A;  . ESOPHAGOGASTRODUODENOSCOPY (EGD) WITH PROPOFOL N/A 05/05/2016   Procedure: ESOPHAGOGASTRODUODENOSCOPY (EGD) WITH PROPOFOL;  Surgeon: Lollie Sails, MD;  Location: [facility] ENDOSCOPY;  Service: Endoscopy;  Laterality: N/A;  . gallbladder  2012  . GASTRIC BYPASS  2012  . REDUCTION MAMMAPLASTY Bilateral    2010  . REDUCTION MAMMAPLASTY      Home Medications:  Allergies as of 10/30/2016   No Known Allergies     Medication List       Accurate as of 10/30/16  1:32 PM. Always use your most recent med list.          azelastine 0.1 % nasal spray Commonly known as:  ASTELIN U 1 SPRAY IEN BID   docusate sodium 100 MG capsule Commonly known as:  COLACE Take 100 mg by mouth 2 (two) times daily.   ferrous sulfate 325 (65 FE) MG tablet TK 1 T PO D WITH BRE   ibuprofen 200 MG tablet Commonly known as:  MOTRIN IB Take 3 tablets (600 mg total) by mouth every 6 (six) hours as  needed.   multivitamin tablet Take 1 tablet by mouth daily.   ondansetron 4 MG disintegrating tablet Commonly known as:  ZOFRAN ODT Take 1 tablet (4 mg total) by mouth every 8 (eight) hours as needed for nausea or vomiting.   oxyCODONE-acetaminophen 5-325 MG tablet Commonly known as:  PERCOCET/ROXICET Take by mouth every 4 (four) hours as needed for severe pain.   oxyCODONE-acetaminophen 5-325 MG tablet Commonly known as:  ROXICET Take 1 tablet by mouth every 6 (six) hours as needed.   pantoprazole 40 MG tablet Commonly known as:  PROTONIX Take 40 mg by mouth daily.   promethazine 25 MG tablet Commonly known as:  PHENERGAN Take 25 mg by mouth every 6 (six) hours as needed for nausea or  vomiting.   RA VITAMIN B-12 TR 1000 MCG Tbcr Generic drug:  Cyanocobalamin Take by mouth.   sucralfate 1 g tablet Commonly known as:  CARAFATE Take 1 g by mouth 4 (four) times daily -  with meals and at bedtime.   tamsulosin 0.4 MG Caps capsule Commonly known as:  FLOMAX Take 1 capsule (0.4 mg total) by mouth daily after supper.       Allergies: No Known Allergies  Family History: Family History  Problem Relation Age of Onset  . Prostate cancer Father   . Kidney Stones Maternal Grandfather   . Kidney disease Neg Hx   . Bladder Cancer Neg Hx     Social History:  reports that she has never smoked. She has never used smokeless tobacco. She reports that she does not drink alcohol or use drugs.  ROS: UROLOGY Frequent Urination?: No Hard to postpone urination?: No Burning/pain with urination?: No Get up at night to urinate?: No Leakage of urine?: No Urine stream starts and stops?: No Trouble starting stream?: No Do you have to strain to urinate?: No Blood in urine?: Yes Urinary tract infection?: No Sexually transmitted disease?: No Injury to kidneys or bladder?: No Painful intercourse?: No Weak stream?: No Currently pregnant?: No Vaginal bleeding?: No Last menstrual period?: n Gastrointestinal Nausea?: No Vomiting?: No Indigestion/heartburn?: No Diarrhea?: No Constipation?: No Constitutional Fever: No Night sweats?: No Weight loss?: No Fatigue?: No Skin Skin rash/lesions?: No Itching?: No Eyes Blurred vision?: No Double vision?: No Ears/Nose/Throat Sore throat?: No Sinus problems?: No Hematologic/Lymphatic Swollen glands?: No Easy bruising?: No Cardiovascular Leg swelling?: No Chest pain?: No Respiratory Cough?: No Shortness of breath?: No Endocrine Excessive thirst?: No Musculoskeletal Back pain?: No Joint pain?: No Neurological Headaches?: No Dizziness?: No Psychologic Depression?: No Anxiety?: No    Physical Exam: BP 118/76    Pulse 75   Ht '5\' 6"'$  (1.676 m)   Wt 185 lb 4.8 oz (84.1 kg)   LMP  (LMP Unknown)   BMI 29.91 kg/m   Constitutional: Well nourished. Alert and oriented, No acute distress. HEENT: [place] AT, moist mucus membranes. Trachea midline, no masses. Cardiovascular: No clubbing, cyanosis, or edema. Respiratory: Normal respiratory effort, no increased work of breathing. GI: Abdomen is soft, non tender, non distended, no abdominal masses. Liver and spleen not palpable.  No hernias appreciated.  Stool sample for occult testing is not indicated.   GU: No CVA tenderness.  No bladder fullness or masses.   Skin: No rashes, bruises or suspicious lesions. Lymph: No cervical or inguinal adenopathy. Neurologic: Grossly intact, no focal deficits, moving all 4 extremities. Psychiatric: Normal mood and affect.   Laboratory Data: Lab Results  Component Value Date   WBC 7.9 10/27/2016   HGB  12.1 10/27/2016   HCT 36.2 10/27/2016   MCV 92.3 10/27/2016   PLT 332 10/27/2016    Lab Results  Component Value Date   CREATININE 0.90 10/27/2016    Urinalysis Unremarkable.  See EPIC.    Pertinent Imaging: CLINICAL DATA:  46 year old female with left lower abdominal pain. Initial encounter.  EXAM: CT ABDOMEN AND PELVIS WITHOUT CONTRAST  TECHNIQUE: Multidetector CT imaging of the abdomen and pelvis was performed following the standard protocol without IV contrast.  COMPARISON:  07/30/16 CT  FINDINGS: Lower chest: Clear.  Hepatobiliary: Taking into account limitation by non contrast imaging, no worrisome hepatic lesion. Post cholecystectomy.  Pancreas: Taking into account limitation by non contrast imaging, no mass or inflammation.  Spleen: Taking into account limitation by non contrast imaging, no mass or enlargement.  Adrenals/Urinary Tract: 4.3 mm distal left ureteral obstructing stone located 1 cm proximal to the left ureteral vesical junction causing moderate left-sided  hydroureteronephrosis.  Two tiny nonobstructing left renal calculi. Two nonobstructing right renal calculi, largest lower pole region measuring up to 6.8 mm. Right upper pole 1 cm cyst.  Taking into account limitation by non contrast imaging, no worrisome renal or adrenal lesion.  Stomach/Bowel: Post gastric bypass surgery. No extraluminal bowel inflammatory process. Portions of bowel under distended and evaluation limited.  Vascular/Lymphatic: No abdominal aortic aneurysm. Scattered normal/ top-normal size retroperitoneal lymph nodes without significant change.  Reproductive: Post hysterectomy.  No adnexal mass noted.  Other: No free intraperitoneal air.  Musculoskeletal: Mild curvature thoracic and lumbar spine without worrisome osseous lesion.  IMPRESSION: 4.3 mm distal left ureteral obstructing stone located 1 cm proximal to the left ureteral vesical junction causing moderate left-sided hydroureteronephrosis.  Two tiny nonobstructing left renal calculi. Two nonobstructing right renal calculi, largest lower pole region measuring up to 6.8 mm. Right upper pole 1 cm cyst.  Portions of bowel under distended and evaluation limited however no extraluminal bowel inflammatory process or free air noted.  Post gastric bypass, cholecystectomy and hysterectomy.   Electronically Signed   By: Genia Del M.D.   On: 10/27/2016 07:33  Patient will undergo left ESWL for her left distal ureteral stone as definitive treatment.    Assessment & Plan:    1. Left ureteral stone  - Discussed MET versus ESWL versus URS and the success rate of each procedure  - Stone to skin distance < 15 cm and stone density < 1500 HU  - She would like to pursue left ESWL for definitive treatment of her 4.3 mm left distal stone - effectiveness of the treatment should reach 88%.  I did advise her that if the stone would not pass after lithotripsy, she may have to undergo a procedure that  would require general anesthesia.  The risks of ESWL are explained to the patient, such as: bleeding, infection, adjacent tissue/organ damage, ineffective/incomplete stone fragmentation and Steinstrasse  - she will continue with MET while awaiting the procedure - she is given refills on tansulosin and a strainer  2. Left hydronephrosis  - obtain RUS to ensure the hydronephrosis has resolved - after stone has passed  3. Microscopic hematuria  - UA today demonstrates no RBC's  - continue to monitor the patient's UA after the treatment/passage of the stone to ensure the hematuria has resolved  - if hematuria persists, we will pursue a hematuria workup with CT Urogram and cystoscopy if appropriate.  4. Left flank pain  - Advised to contact our office or seek treatment in the  ED if becomes febrile or pain/ vomiting are difficult control in order to arrange for emergent/urgent intervention  5. Nephrolithiasis  - reviewed general stone prevention techniques including drinking plenty water with goal of producing 2.5 L urine daily, increased citric acid intake, avoidance of high oxalate containing foods, and decreased salt intake.    - Option for metabolic workup reviewed again today - she would like to pursue this work up in the future  Return for left ESWL.  Zara Council, [facility] [address] [place], [place] [address] [phone]

## 2016-10-30 ENCOUNTER — Ambulatory Visit (INDEPENDENT_AMBULATORY_CARE_PROVIDER_SITE_OTHER): Payer: BLUE CROSS/BLUE SHIELD | Admitting: Urology

## 2016-10-30 ENCOUNTER — Encounter: Payer: Self-pay | Admitting: Urology

## 2016-10-30 VITALS — BP 118/76 | HR 75 | Ht 66.0 in | Wt 185.3 lb

## 2016-10-30 DIAGNOSIS — N132 Hydronephrosis with renal and ureteral calculous obstruction: Secondary | ICD-10-CM

## 2016-10-30 DIAGNOSIS — N200 Calculus of kidney: Secondary | ICD-10-CM | POA: Diagnosis not present

## 2016-10-30 DIAGNOSIS — R3129 Other microscopic hematuria: Secondary | ICD-10-CM

## 2016-10-30 DIAGNOSIS — N201 Calculus of ureter: Secondary | ICD-10-CM

## 2016-10-30 DIAGNOSIS — R109 Unspecified abdominal pain: Secondary | ICD-10-CM | POA: Diagnosis not present

## 2016-10-30 LAB — URINALYSIS, COMPLETE
Bilirubin, UA: NEGATIVE
Glucose, UA: NEGATIVE
KETONES UA: NEGATIVE
Leukocytes, UA: NEGATIVE
Nitrite, UA: NEGATIVE
Protein, UA: NEGATIVE
RBC, UA: NEGATIVE
Urobilinogen, Ur: 0.2 mg/dL (ref 0.2–1.0)
pH, UA: 5 (ref 5.0–7.5)

## 2016-10-30 LAB — MICROSCOPIC EXAMINATION: RBC MICROSCOPIC, UA: NONE SEEN /HPF (ref 0–?)

## 2016-10-30 MED ORDER — TAMSULOSIN HCL 0.4 MG PO CAPS: 0.4000 mg | ORAL_CAPSULE | Freq: Every day | ORAL | 0 refills | Status: DC

## 2016-10-31 LAB — CBC WITH DIFFERENTIAL
BASOS ABS: 0 10*3/uL (ref 0.0–0.2)
Basos: 0 %
EOS (ABSOLUTE): 0 10*3/uL (ref 0.0–0.4)
Eos: 0 %
Hematocrit: 36.8 % (ref 34.0–46.6)
Hemoglobin: 12.1 g/dL (ref 11.1–15.9)
IMMATURE GRANULOCYTES: 0 %
Immature Grans (Abs): 0 10*3/uL (ref 0.0–0.1)
Lymphocytes Absolute: 2.6 10*3/uL (ref 0.7–3.1)
Lymphs: 32 %
MCH: 30.7 pg (ref 26.6–33.0)
MCHC: 32.9 g/dL (ref 31.5–35.7)
MCV: 93 fL (ref 79–97)
MONOS ABS: 0.8 10*3/uL (ref 0.1–0.9)
Monocytes: 10 %
NEUTROS PCT: 58 %
Neutrophils Absolute: 4.6 10*3/uL (ref 1.4–7.0)
RBC: 3.94 x10E6/uL (ref 3.77–5.28)
RDW: 14.8 % (ref 12.3–15.4)
WBC: 8.1 10*3/uL (ref 3.4–10.8)

## 2016-10-31 LAB — HCG, SERUM, QUALITATIVE: hCG,Beta Subunit,Qual,Serum: NEGATIVE m[IU]/mL (ref <6)

## 2016-10-31 LAB — BASIC METABOLIC PANEL
BUN / CREAT RATIO: 19 (ref 9–23)
BUN: 15 mg/dL (ref 6–24)
CHLORIDE: 101 mmol/L (ref 96–106)
CO2: 23 mmol/L (ref 18–29)
Calcium: 8.7 mg/dL (ref 8.7–10.2)
Creatinine, Ser: 0.8 mg/dL (ref 0.57–1.00)
GFR calc Af Amer: 103 mL/min/{1.73_m2} (ref >59)
GFR calc non Af Amer: 89 mL/min/{1.73_m2} (ref >59)
GLUCOSE: 56 mg/dL — AB (ref 65–99)
Potassium: 4.8 mmol/L (ref 3.5–5.2)
SODIUM: 142 mmol/L (ref 134–144)

## 2016-11-02 LAB — CULTURE, URINE COMPREHENSIVE

## 2016-11-06 ENCOUNTER — Encounter
Admission: RE | Disposition: A | Discharge status: Home / Self Care | Payer: Self-pay | Source: Ambulatory Visit | Attending: Urology

## 2016-11-06 ENCOUNTER — Encounter: Payer: Self-pay | Admitting: *Deleted

## 2016-11-06 ENCOUNTER — Ambulatory Visit: Payer: BLUE CROSS/BLUE SHIELD

## 2016-11-06 ENCOUNTER — Ambulatory Visit
Admission: RE | Admit: 2016-11-06 | Discharge: 2016-11-06 | Disposition: A | Discharge status: Home / Self Care | Payer: BLUE CROSS/BLUE SHIELD | Source: Ambulatory Visit | Attending: Urology | Admitting: Urology

## 2016-11-06 DIAGNOSIS — N200 Calculus of kidney: Secondary | ICD-10-CM | POA: Insufficient documentation

## 2016-11-06 DIAGNOSIS — N201 Calculus of ureter: Secondary | ICD-10-CM | POA: Diagnosis not present

## 2016-11-06 HISTORY — PX: EXTRACORPOREAL SHOCK WAVE LITHOTRIPSY: SHX1557

## 2016-11-06 SURGERY — LITHOTRIPSY, ESWL
Anesthesia: Moderate Sedation | Laterality: Left

## 2016-11-06 MED ORDER — DIPHENHYDRAMINE HCL 25 MG PO CAPS
ORAL_CAPSULE | ORAL | Status: AC
  Filled 2016-11-06: qty 1

## 2016-11-06 MED ORDER — DIAZEPAM 5 MG PO TABS
ORAL_TABLET | ORAL | Status: AC
  Filled 2016-11-06: qty 2

## 2016-11-06 MED ORDER — DEXTROSE-NACL 5-0.45 % IV SOLN
INTRAVENOUS | Status: DC
  Administered 2016-11-06: 12:00:00 via INTRAVENOUS

## 2016-11-06 MED ORDER — CIPROFLOXACIN HCL 500 MG PO TABS
ORAL_TABLET | ORAL | Status: AC
  Filled 2016-11-06: qty 1

## 2016-11-06 MED ORDER — DIAZEPAM 5 MG PO TABS
10.0000 mg | ORAL_TABLET | Freq: Once | ORAL | Status: AC
  Administered 2016-11-06: 10 mg via ORAL

## 2016-11-06 MED ORDER — DIPHENHYDRAMINE HCL 25 MG PO CAPS
25.0000 mg | ORAL_CAPSULE | Freq: Once | ORAL | Status: AC
  Administered 2016-11-06: 25 mg via ORAL

## 2016-11-06 MED ORDER — CIPROFLOXACIN HCL 500 MG PO TABS
500.0000 mg | ORAL_TABLET | ORAL | Status: AC
  Administered 2016-11-06: 500 mg via ORAL

## 2016-11-06 NOTE — Discharge Instructions (Signed)
Follow Dr. Carlynn Purl postop discharge instruction sheet for ESL patients as reviewed.  AMBULATORY SURGERY  DISCHARGE INSTRUCTIONS   1) The drugs that you were given will stay in your system until tomorrow so for the next 24 hours you should not:  A) Drive an automobile B) Make any legal decisions C) Drink any alcoholic beverage   2) You may resume regular meals tomorrow.  Today it is better to start with liquids and gradually work up to solid foods.  You may eat anything you prefer, but it is better to start with liquids, then soup and crackers, and gradually work up to solid foods.   3) Please notify your doctor immediately if you have any unusual bleeding, trouble breathing, redness and pain at the surgery site, drainage, fever, or pain not relieved by medication.    4) Additional Instructions:        Please contact your physician with any problems or Same Day Surgery at [phone], Monday through Friday 6 am to 4 pm, or [place] at [facility] number at [phone].

## 2016-11-06 NOTE — OR Nursing (Signed)
Pt to radiology via w/c by Grayland Jack, CNA, preop for KUB

## 2016-11-06 NOTE — OR Nursing (Signed)
Short stay summary on chart for litho staff

## 2016-11-07 ENCOUNTER — Encounter: Payer: Self-pay | Admitting: Urology

## 2016-11-07 ENCOUNTER — Telehealth: Payer: Self-pay | Admitting: Urology

## 2016-11-07 DIAGNOSIS — N200 Calculus of kidney: Secondary | ICD-10-CM

## 2016-11-07 NOTE — Telephone Encounter (Signed)
Patient will need a KUB prior to her appt on 11-19-2016. Dr. Pilar Jarvis did litho on 11-06-16 and he wants her to have this prior   Thanks, Sharyn Lull

## 2016-11-07 NOTE — Telephone Encounter (Signed)
Orders placed.

## 2016-11-12 DIAGNOSIS — J069 Acute upper respiratory infection, unspecified: Secondary | ICD-10-CM | POA: Diagnosis not present

## 2016-11-19 ENCOUNTER — Ambulatory Visit (INDEPENDENT_AMBULATORY_CARE_PROVIDER_SITE_OTHER): Payer: BLUE CROSS/BLUE SHIELD | Admitting: Urology

## 2016-11-19 ENCOUNTER — Ambulatory Visit
Admission: RE | Admit: 2016-11-19 | Discharge: 2016-11-19 | Disposition: A | Discharge status: Home / Self Care | Payer: BLUE CROSS/BLUE SHIELD | Source: Ambulatory Visit | Attending: Urology | Admitting: Urology

## 2016-11-19 ENCOUNTER — Encounter: Payer: Self-pay | Admitting: Urology

## 2016-11-19 VITALS — BP 109/69 | HR 60 | Ht 66.0 in | Wt 183.4 lb

## 2016-11-19 DIAGNOSIS — N200 Calculus of kidney: Secondary | ICD-10-CM | POA: Diagnosis not present

## 2016-11-19 DIAGNOSIS — N201 Calculus of ureter: Secondary | ICD-10-CM | POA: Diagnosis not present

## 2016-11-19 DIAGNOSIS — N2889 Other specified disorders of kidney and ureter: Secondary | ICD-10-CM | POA: Diagnosis not present

## 2016-11-19 NOTE — Progress Notes (Signed)
11/19/2016 11:00 AM   Valerie Colon 09/02/1970 [id number]  Referring provider: Glendon Axe, MD [facility] [address] [place], [place] [address]  Chief Complaint  Patient presents with  . Nephrolithiasis    HPI: The patient is a 46 year old female presents today after undergoing lithotripsy on a 4 mm left distal ureteral stone. She also has an untreated 6 mm right lower pole renal stone.  KUB today confirms that the left ureteral stone is no longer present. She is symptom-free at this time. She is no more left flank pain. Unfortunately, she was unable to obtain a fragment of stone for analysis. She has had stones in the past, however she is unsure of their composition.   PMH: Past Medical History:  Diagnosis Date  . Acid reflux   . Allergic rhinitis   . Anxiety   . Heart murmur   . History of hiatal hernia   . Sleep apnea     Surgical History: Past Surgical History:  Procedure Laterality Date  . ABDOMINAL HYSTERECTOMY    . BREAST BIOPSY Right 12/19/2013   stereo right  . CHOLECYSTECTOMY    . COLONOSCOPY WITH PROPOFOL N/A 05/05/2016   Procedure: COLONOSCOPY WITH PROPOFOL;  Surgeon: Lollie Sails, MD;  Location: [facility] ENDOSCOPY;  Service: Endoscopy;  Laterality: N/A;  . ESOPHAGOGASTRODUODENOSCOPY (EGD) WITH PROPOFOL N/A 05/05/2016   Procedure: ESOPHAGOGASTRODUODENOSCOPY (EGD) WITH PROPOFOL;  Surgeon: Lollie Sails, MD;  Location: [facility] ENDOSCOPY;  Service: Endoscopy;  Laterality: N/A;  . EXTRACORPOREAL SHOCK WAVE LITHOTRIPSY Left 11/06/2016   Procedure: EXTRACORPOREAL SHOCK WAVE LITHOTRIPSY (ESWL);  Surgeon: Nickie Retort, MD;  Location: [facility] ORS;  Service: Urology;  Laterality: Left;  . gallbladder  2012  . GASTRIC BYPASS  2012  . REDUCTION MAMMAPLASTY Bilateral    2010  . REDUCTION MAMMAPLASTY      Home Medications:  Allergies as of 11/19/2016   No Known Allergies     Medication List       Accurate as of 11/19/16 11:00 AM. Always use  your most recent med list.          amoxicillin 875 MG tablet Commonly known as:  AMOXIL Take 875 mg by mouth 2 (two) times daily.   azelastine 0.1 % nasal spray Commonly known as:  ASTELIN U 1 SPRAY IEN BID   cetirizine 10 MG tablet Commonly known as:  ZYRTEC Take 10 mg by mouth daily.   ibuprofen 200 MG tablet Commonly known as:  MOTRIN IB Take 3 tablets (600 mg total) by mouth every 6 (six) hours as needed.   multivitamin tablet Take 1 tablet by mouth daily.   RA VITAMIN B-12 TR 1000 MCG Tbcr Generic drug:  Cyanocobalamin Take by mouth.   sucralfate 1 g tablet Commonly known as:  CARAFATE Take 1 g by mouth 4 (four) times daily -  with meals and at bedtime.   tamsulosin 0.4 MG Caps capsule Commonly known as:  FLOMAX Take 1 capsule (0.4 mg total) by mouth daily after supper.       Allergies: No Known Allergies  Family History: Family History  Problem Relation Age of Onset  . Prostate cancer Father   . Kidney Stones Maternal Grandfather   . Kidney disease Neg Hx   . Bladder Cancer Neg Hx     Social History:  reports that she has never smoked. She has never used smokeless tobacco. She reports that she does not drink alcohol or use drugs.  ROS: UROLOGY Frequent  Urination?: No Hard to postpone urination?: No Burning/pain with urination?: No Get up at night to urinate?: No Leakage of urine?: No Urine stream starts and stops?: No Trouble starting stream?: No Do you have to strain to urinate?: No Blood in urine?: No Urinary tract infection?: No Sexually transmitted disease?: No Injury to kidneys or bladder?: No Painful intercourse?: No Weak stream?: No Currently pregnant?: No Vaginal bleeding?: No Last menstrual period?: n  Gastrointestinal Nausea?: No Vomiting?: No Indigestion/heartburn?: No Diarrhea?: No Constipation?: No  Constitutional Fever: No Night sweats?: No Weight loss?: No Fatigue?: No  Skin Skin rash/lesions?: No Itching?:  No  Eyes Blurred vision?: No Double vision?: No  Ears/Nose/Throat Sore throat?: No Sinus problems?: No  Hematologic/Lymphatic Swollen glands?: No Easy bruising?: No  Cardiovascular Leg swelling?: No Chest pain?: No  Respiratory Cough?: No Shortness of breath?: No  Endocrine Excessive thirst?: No  Musculoskeletal Back pain?: No Joint pain?: No  Neurological Headaches?: No Dizziness?: No  Psychologic Depression?: No Anxiety?: No  Physical Exam: BP 109/69 (BP Location: Left Arm, Patient Position: Sitting, Cuff Size: Normal)   Pulse 60   Ht 5\' 6"  (1.676 m)   Wt 183 lb 6.4 oz (83.2 kg)   LMP  (LMP Unknown)   BMI 29.60 kg/m   Constitutional:  Alert and oriented, No acute distress. HEENT: [place] AT, moist mucus membranes.  Trachea midline, no masses. Cardiovascular: No clubbing, cyanosis, or edema. Respiratory: Normal respiratory effort, no increased work of breathing. GI: Abdomen is soft, nontender, nondistended, no abdominal masses GU: No CVA tenderness.  Skin: No rashes, bruises or suspicious lesions. Lymph: No cervical or inguinal adenopathy. Neurologic: Grossly intact, no focal deficits, moving all 4 extremities. Psychiatric: Normal mood and affect.  Laboratory Data: Lab Results  Component Value Date   WBC 8.1 10/30/2016   HGB 12.1 10/27/2016   HCT 36.8 10/30/2016   MCV 93 10/30/2016   PLT 332 10/27/2016    Lab Results  Component Value Date   CREATININE 0.80 10/30/2016    No results found for: PSA  No results found for: TESTOSTERONE  No results found for: HGBA1C  Urinalysis    Component Value Date/Time   COLORURINE YELLOW (A) 10/27/2016 0516   APPEARANCEUR Clear 10/30/2016 1211   LABSPEC 1.023 10/27/2016 0516   PHURINE 5.0 10/27/2016 0516   GLUCOSEU Negative 10/30/2016 1211   HGBUR LARGE (A) 10/27/2016 0516   BILIRUBINUR Negative 10/30/2016 1211   KETONESUR NEGATIVE 10/27/2016 0516   PROTEINUR Negative 10/30/2016 1211   PROTEINUR  NEGATIVE 10/27/2016 0516   NITRITE Negative 10/30/2016 1211   NITRITE NEGATIVE 10/27/2016 0516   LEUKOCYTESUR Negative 10/30/2016 1211    Pertinent Imaging: Reviewed as above  Assessment & Plan:   1. Left ureteral stone This has passed after lithotripsy.  2. Right 6 mm lower pole nonobstructing calculus I discussed options for this stone with the patient including active surveillance versus treatment. She at this time has elected for active surveillance as she is not interested in undergoing another surgery at this time. She'll follow-up in 6 months with a KUB prior.  Return in about 6 months (around 05/21/2017) for KUB prior.   Nickie Retort, MD  [facility] [address], [place], [place] [address] [phone]

## 2017-01-05 ENCOUNTER — Emergency Department: Payer: BLUE CROSS/BLUE SHIELD

## 2017-01-05 ENCOUNTER — Encounter: Payer: Self-pay | Admitting: Emergency Medicine

## 2017-01-05 ENCOUNTER — Emergency Department
Admission: EM | Admit: 2017-01-05 | Discharge: 2017-01-05 | Disposition: A | Discharge status: Home / Self Care | Payer: BLUE CROSS/BLUE SHIELD | Attending: Emergency Medicine | Admitting: Emergency Medicine

## 2017-01-05 DIAGNOSIS — J029 Acute pharyngitis, unspecified: Secondary | ICD-10-CM | POA: Diagnosis not present

## 2017-01-05 DIAGNOSIS — J209 Acute bronchitis, unspecified: Secondary | ICD-10-CM | POA: Diagnosis not present

## 2017-01-05 DIAGNOSIS — R109 Unspecified abdominal pain: Secondary | ICD-10-CM | POA: Diagnosis not present

## 2017-01-05 DIAGNOSIS — N200 Calculus of kidney: Secondary | ICD-10-CM | POA: Diagnosis not present

## 2017-01-05 DIAGNOSIS — R1032 Left lower quadrant pain: Secondary | ICD-10-CM | POA: Diagnosis present

## 2017-01-05 DIAGNOSIS — R05 Cough: Secondary | ICD-10-CM | POA: Diagnosis not present

## 2017-01-05 HISTORY — DX: Calculus of kidney: N20.0

## 2017-01-05 LAB — COMPREHENSIVE METABOLIC PANEL
ALK PHOS: 65 U/L (ref 38–126)
ALT: 16 U/L (ref 14–54)
AST: 30 U/L (ref 15–41)
Albumin: 3.7 g/dL (ref 3.5–5.0)
Anion gap: 7 (ref 5–15)
BUN: 11 mg/dL (ref 6–20)
CALCIUM: 8.2 mg/dL — AB (ref 8.9–10.3)
CHLORIDE: 105 mmol/L (ref 101–111)
CO2: 26 mmol/L (ref 22–32)
CREATININE: 0.85 mg/dL (ref 0.44–1.00)
GFR calc Af Amer: >60 mL/min (ref >60)
Glucose, Bld: 256 mg/dL — ABNORMAL HIGH (ref 65–99)
Potassium: 3.1 mmol/L — ABNORMAL LOW (ref 3.5–5.1)
Sodium: 138 mmol/L (ref 135–145)
Total Bilirubin: 0.2 mg/dL — ABNORMAL LOW (ref 0.3–1.2)
Total Protein: 7.5 g/dL (ref 6.5–8.1)

## 2017-01-05 LAB — CBC
HCT: 39.4 % (ref 35.0–47.0)
Hemoglobin: 13 g/dL (ref 12.0–16.0)
MCH: 31.1 pg (ref 26.0–34.0)
MCHC: 33.1 g/dL (ref 32.0–36.0)
MCV: 94 fL (ref 80.0–100.0)
PLATELETS: 257 10*3/uL (ref 150–440)
RBC: 4.19 MIL/uL (ref 3.80–5.20)
RDW: 14 % (ref 11.5–14.5)
WBC: 3.7 10*3/uL (ref 3.6–11.0)

## 2017-01-05 LAB — URINALYSIS, COMPLETE (UACMP) WITH MICROSCOPIC
Bacteria, UA: NONE SEEN
Bilirubin Urine: NEGATIVE
Glucose, UA: >=500 mg/dL — AB
Ketones, ur: 20 mg/dL — AB
LEUKOCYTES UA: NEGATIVE
Nitrite: NEGATIVE
PH: 5 (ref 5.0–8.0)
Protein, ur: NEGATIVE mg/dL
SPECIFIC GRAVITY, URINE: 1.025 (ref 1.005–1.030)

## 2017-01-05 MED ORDER — NAPROXEN 500 MG PO TABS
500.0000 mg | ORAL_TABLET | Freq: Once | ORAL | Status: AC
  Administered 2017-01-05: 500 mg via ORAL
  Filled 2017-01-05 (×2): qty 1

## 2017-01-05 MED ORDER — PREDNISONE 20 MG PO TABS
40.0000 mg | ORAL_TABLET | ORAL | Status: AC
  Administered 2017-01-05: 40 mg via ORAL
  Filled 2017-01-05: qty 2

## 2017-01-05 MED ORDER — PREDNISONE 20 MG PO TABS: 40.0000 mg | ORAL_TABLET | Freq: Every day | ORAL | 0 refills | Status: DC

## 2017-01-05 MED ORDER — IPRATROPIUM-ALBUTEROL 0.5-2.5 (3) MG/3ML IN SOLN
3.0000 mL | Freq: Once | RESPIRATORY_TRACT | Status: AC
  Administered 2017-01-05: 3 mL via RESPIRATORY_TRACT
  Filled 2017-01-05: qty 3

## 2017-01-05 MED ORDER — ALBUTEROL SULFATE HFA 108 (90 BASE) MCG/ACT IN AERS: 2.0000 | INHALATION_SPRAY | RESPIRATORY_TRACT | 0 refills | Status: DC | PRN

## 2017-01-05 MED ORDER — NAPROXEN 500 MG PO TABS: 500.0000 mg | ORAL_TABLET | Freq: Two times a day (BID) | ORAL | 0 refills | Status: DC

## 2017-01-05 MED ORDER — FAMOTIDINE 20 MG PO TABS: 20.0000 mg | ORAL_TABLET | Freq: Two times a day (BID) | ORAL | 0 refills | Status: DC

## 2017-01-05 NOTE — Discharge Instructions (Signed)
Your CT scan of the abdomen shows some small stones in the kidney but none in the ureters and without any obstructing stones. You likely passed a small stone causing that flank pain. Your lab tests are unremarkable today and don't show any signs of urine infection. Your chest x-ray does not show any pneumonia. Take medications as prescribed to treat your symptoms while this viral respiratory illness runs its course.

## 2017-01-05 NOTE — ED Triage Notes (Signed)
R flank pain began today, history of kidney stones. Sore throat x 1 week.

## 2017-01-05 NOTE — ED Notes (Signed)
Pt discharged to home.  Family member driving.  Discharge instructions reviewed.  Verbalized understanding.  No questions or concerns at this time.  Teach back verified.  Pt in NAD.  No items left in ED.   

## 2017-01-05 NOTE — ED Provider Notes (Signed)
[facility] Emergency Department Provider Note  ____________________________________________  Time seen: Approximately 7:43 PM  I have reviewed the triage vital signs and the nursing notes.   HISTORY  Chief Complaint Flank Pain and Sore Throat    HPI Valerie Colon is a 46 y.o. female who reports shortness of breath with productive cough over the past week with sore throat and clear rhinorrhea. No chest pain. No exertional or pleuritic symptoms. Shortness of breath is constant but waxing and waning without aggravating or alleviating factors. Mild in intensity. No history of asthma or underlying lung disease.  Also reports left flank pain radiating to the left lower quadrant that feels like a kidney stone. 6 is been going on for the past 5 days as well. No dysuria frequency or urgency. No fever or chills.     Past Medical History:  Diagnosis Date  . Acid reflux   . Allergic rhinitis   . Anxiety   . Heart murmur   . History of hiatal hernia   . Kidney stones   . Sleep apnea      Patient Active Problem List   Diagnosis Date Noted  . Nephrolithiasis 02/29/2016  . Chronic fatigue 01/24/2016  . Dizziness 01/24/2016  . Gastroesophageal reflux disease without esophagitis 01/24/2016  . GAD (generalized anxiety disorder) 11/20/2015  . Emotional stress 11/09/2015  . Intermittent palpitations 11/09/2015  . Vitamin D deficiency 07/09/2015  . Allergic rhinitis 03/29/2015  . Chronic anemia 01/08/2015  . Prediabetes 01/08/2015  . Obstructive apnea 01/20/2014  . Hypoglycemia after GI (gastrointestinal) surgery 11/26/2012     Past Surgical History:  Procedure Laterality Date  . ABDOMINAL HYSTERECTOMY    . BREAST BIOPSY Right 12/19/2013   stereo right  . CHOLECYSTECTOMY    . COLONOSCOPY WITH PROPOFOL N/A 05/05/2016   Procedure: COLONOSCOPY WITH PROPOFOL;  Surgeon: Lollie Sails, MD;  Location[facility] ENDOSCOPY;  Service: Endoscopy;  Laterality: N/A;   . ESOPHAGOGASTRODUODENOSCOPY (EGD) WITH PROPOFOL N/A 05/05/2016   Procedure: ESOPHAGOGASTRODUODENOSCOPY (EGD) WITH PROPOFOL;  Surgeon: Lollie Sails, MD;  Location: [facility] ENDOSCOPY;  Service: Endoscopy;  Laterality: N/A;  . EXTRACORPOREAL SHOCK WAVE LITHOTRIPSY Left 11/06/2016   Procedure: EXTRACORPOREAL SHOCK WAVE LITHOTRIPSY (ESWL);  Surgeon: Nickie Retort, MD;  Location: [facility] ORS;  Service: Urology;  Laterality: Left;  . gallbladder  2012  . GASTRIC BYPASS  2012  . REDUCTION MAMMAPLASTY Bilateral    2010  . REDUCTION MAMMAPLASTY       Prior to Admission medications   Medication Sig Start Date End Date Taking? Authorizing Provider  albuterol (PROVENTIL HFA) 108 (90 Base) MCG/ACT inhaler Inhale 2 puffs into the lungs every 4 (four) hours as needed for wheezing or shortness of breath. 01/05/17   Carrie Mew, MD  amoxicillin (AMOXIL) 875 MG tablet Take 875 mg by mouth 2 (two) times daily.    [provider]  azelastine (ASTELIN) 0.1 % nasal spray U 1 SPRAY IEN BID 09/24/16   [provider]  cetirizine (ZYRTEC) 10 MG tablet Take 10 mg by mouth daily.    [provider]  Cyanocobalamin (RA VITAMIN B-12 TR) 1000 MCG TBCR Take by mouth.    [provider]  famotidine (PEPCID) 20 MG tablet Take 1 tablet (20 mg total) by mouth 2 (two) times daily. 01/05/17   Carrie Mew, MD  ibuprofen (MOTRIN IB) 200 MG tablet Take 3 tablets (600 mg total) by mouth every 6 (six) hours as needed. 03/29/16   Schuyler Amor,  MD  Multiple Vitamin (MULTIVITAMIN) tablet Take 1 tablet by mouth daily.    [provider]  naproxen (NAPROSYN) 500 MG tablet Take 1 tablet (500 mg total) by mouth 2 (two) times daily with a meal. 01/05/17   Carrie Mew, MD  predniSONE (DELTASONE) 20 MG tablet Take 2 tablets (40 mg total) by mouth daily. 01/05/17   Carrie Mew, MD  sucralfate (CARAFATE) 1 g tablet Take 1 g by mouth 4 (four) times daily -  with meals and at  bedtime.    [provider]  tamsulosin (FLOMAX) 0.4 MG CAPS capsule Take 1 capsule (0.4 mg total) by mouth daily after supper. 10/30/16   Zara Council A, PA-C     Allergies Patient has no known allergies.   Family History  Problem Relation Age of Onset  . Prostate cancer Father   . Kidney Stones Maternal Grandfather   . Kidney disease Neg Hx   . Bladder Cancer Neg Hx     Social History Social History  Substance Use Topics  . Smoking status: Never Smoker  . Smokeless tobacco: Never Used  . Alcohol use No    Review of Systems  Constitutional:   No fever or chills.  ENT:   Positive sore throat and rhinorrhea. Cardiovascular:   No chest pain or syncope. Respiratory:   Positive shortness of breath and cough as above. Gastrointestinal:   Positive left flank pain without vomiting or diarrhea.  Musculoskeletal:   Negative for focal pain or swelling All other systems reviewed and are negative except as documented above in ROS and HPI.  ____________________________________________   PHYSICAL EXAM:  VITAL SIGNS: ED Triage Vitals  Enc Vitals Group     BP 01/05/17 1549 121/75     Pulse Rate 01/05/17 1549 87     Resp 01/05/17 1549 20     Temp 01/05/17 1549 98.1 F (36.7 C)     Temp Source 01/05/17 1549 Oral     SpO2 01/05/17 1549 100 %     Weight 01/05/17 1551 170 lb (77.1 kg)     Height 01/05/17 1551 5\' 6"  (1.676 m)     Head Circumference --      Peak Flow --      Pain Score 01/05/17 1549 7     Pain Loc --      Pain Edu? --      Excl. in [place]? --     Vital signs reviewed, nursing assessments reviewed.   Constitutional:   Alert and oriented. Well appearing and in no distress. Eyes:   No scleral icterus. No conjunctival pallor. PERRL. EOMI.  No nystagmus. ENT   Head:   Normocephalic and atraumatic.   Nose:   Positive rhinorrhea.    Mouth/Throat:   MMM, Mild pharyngeal erythema. No peritonsillar mass.    Neck:   No meningismus. Full  ROM Hematological/Lymphatic/Immunilogical:   No cervical lymphadenopathy. Cardiovascular:   RRR. Symmetric bilateral radial and DP pulses.  No murmurs.  Respiratory:   Normal respiratory effort without tachypnea/retractions. Good air entry in all lung fields. Diffuse expiratory wheezing with coughing Gastrointestinal:   Soft with mild left mid abdominal tenderness. Non distended. There is no CVA tenderness.  No rebound, rigidity, or guarding. Genitourinary:   deferred Musculoskeletal:   Normal range of motion in all extremities. No joint effusions.  No lower extremity tenderness.  No edema. Neurologic:   Normal speech and language.  CN 2-10 normal. Motor grossly intact. No gross focal neurologic deficits are  appreciated.  Skin:    Skin is warm, dry and intact. No rash noted.  No petechiae, purpura, or bullae.  ____________________________________________    LABS (pertinent positives/negatives) (all labs ordered are listed, but only abnormal results are displayed) Labs Reviewed  COMPREHENSIVE METABOLIC PANEL - Abnormal; Notable for the following:       Result Value   Potassium 3.1 (*)    Glucose, Bld 256 (*)    Calcium 8.2 (*)    Total Bilirubin 0.2 (*)    All other components within normal limits  URINALYSIS, COMPLETE (UACMP) WITH MICROSCOPIC - Abnormal; Notable for the following:    Color, Urine YELLOW (*)    APPearance HAZY (*)    Glucose, UA >=500 (*)    Hgb urine dipstick SMALL (*)    Ketones, ur 20 (*)    Squamous Epithelial / LPF 0-5 (*)    All other components within normal limits  CBC   ____________________________________________   EKG    ____________________________________________    RADIOLOGY  Dg Chest 2 View  Result Date: 01/05/2017 CLINICAL DATA:  Right flank pain.  Wheezing and cough. EXAM: CHEST  2 VIEW COMPARISON:  09/18/2010. FINDINGS: The heart size and mediastinal contours are within normal limits. Both lungs are clear. The visualized skeletal  structures are unremarkable. IMPRESSION: No active cardiopulmonary disease. Electronically Signed   By: Kerby Moors M.D.   On: 01/05/2017 18:53   Ct Renal Stone Study  Result Date: 01/05/2017 CLINICAL DATA:  Right flank pain with history of kidney stones EXAM: CT ABDOMEN AND PELVIS WITHOUT CONTRAST TECHNIQUE: Multidetector CT imaging of the abdomen and pelvis was performed following the standard protocol without IV contrast. COMPARISON:  11/19/2016, 10/27/2016 FINDINGS: Lower chest: Lung bases demonstrate no acute consolidation or effusion. Normal heart size. Hepatobiliary: No focal liver abnormality is seen. Status post cholecystectomy. No biliary dilatation. Pancreas: Unremarkable. No pancreatic ductal dilatation or surrounding inflammatory changes. Spleen: Normal in size without focal abnormality. Adrenals/Urinary Tract: Adrenal glands are within normal limits. No hydronephrosis. Bilateral intrarenal calculi, largest stone on the right measures 7 mm and is in the lower pole. Punctate stones in the mid and lower pole of left kidney. Stable 1 cm hypodense lesion posterior cortex mid right kidney. No ureteral stones. Bladder normal. Stomach/Bowel: Postsurgical changes of the stomach with a small hiatal hernia present. Appendix appears normal. No evidence of bowel wall thickening, distention, or inflammatory changes. Vascular/Lymphatic: No significant vascular findings are present. No enlarged abdominal or pelvic lymph nodes. Reproductive: Uterus and bilateral adnexa are unremarkable. Other: No free air or free fluid. Musculoskeletal: No acute or significant osseous findings. IMPRESSION: 1. Bilateral intrarenal calculi. No hydronephrosis or ureteral stone. 2. Postsurgical changes of the stomach. No CT evidence for acute intra-abdominal or pelvic pathology. Electronically Signed   By: Donavan Foil M.D.   On: 01/05/2017 19:08     ____________________________________________   PROCEDURES Procedures  ____________________________________________   INITIAL IMPRESSION / ASSESSMENT AND PLAN / ED COURSE  Pertinent labs & imaging results that were available during my care of the patient were reviewed by me and considered in my medical decision making (see chart for details).   Clinical Course as of Jan 05 1942  Mon Jan 05, 2017  8315 P/w acute bronchitis as well as R renal colic. Chest cxr, CT a/p. Meds for sx control. Considering the patient's symptoms, medical history, and physical examination today, I have low suspicion for ACS, PE, TAD, pneumothorax, carditis, mediastinitis, pneumonia, CHF, or sepsis.    [  PS]    Clinical Course User Index [PS] Carrie Mew, MD     ----------------------------------------- 7:46 PM on 01/05/2017 -----------------------------------------  CT and chest x-ray negative.Considering the patient's symptoms, medical history, and physical examination today, I have low suspicion for cholecystitis or biliary pathology, pancreatitis, perforation or bowel obstruction, hernia, intra-abdominal abscess, AAA or dissection, volvulus or intussusception, mesenteric ischemia, or appendicitis.    ____________________________________________   FINAL CLINICAL IMPRESSION(S) / ED DIAGNOSES  Final diagnoses:  Left flank pain  Kidney stone  Acute bronchitis, unspecified organism      New Prescriptions   ALBUTEROL (PROVENTIL HFA) 108 (90 BASE) MCG/ACT INHALER    Inhale 2 puffs into the lungs every 4 (four) hours as needed for wheezing or shortness of breath.   FAMOTIDINE (PEPCID) 20 MG TABLET    Take 1 tablet (20 mg total) by mouth 2 (two) times daily.   NAPROXEN (NAPROSYN) 500 MG TABLET    Take 1 tablet (500 mg total) by mouth 2 (two) times daily with a meal.   PREDNISONE (DELTASONE) 20 MG TABLET    Take 2 tablets (40 mg total) by mouth daily.     Portions of this note were  generated with dragon dictation software. Dictation errors may occur despite best attempts at proofreading.    Carrie Mew, MD 01/05/17 1946

## 2017-01-16 ENCOUNTER — Ambulatory Visit: Payer: BLUE CROSS/BLUE SHIELD

## 2017-01-16 DIAGNOSIS — J301 Allergic rhinitis due to pollen: Secondary | ICD-10-CM | POA: Diagnosis not present

## 2017-01-19 DIAGNOSIS — J301 Allergic rhinitis due to pollen: Secondary | ICD-10-CM | POA: Diagnosis not present

## 2017-01-26 DIAGNOSIS — J301 Allergic rhinitis due to pollen: Secondary | ICD-10-CM | POA: Diagnosis not present

## 2017-01-30 ENCOUNTER — Ambulatory Visit
Admission: RE | Admit: 2017-01-30 | Discharge: 2017-01-30 | Disposition: A | Discharge status: Home / Self Care | Payer: BLUE CROSS/BLUE SHIELD | Source: Ambulatory Visit | Attending: Urology | Admitting: Urology

## 2017-01-30 DIAGNOSIS — N200 Calculus of kidney: Secondary | ICD-10-CM | POA: Diagnosis not present

## 2017-01-30 DIAGNOSIS — R195 Other fecal abnormalities: Secondary | ICD-10-CM | POA: Insufficient documentation

## 2017-01-30 DIAGNOSIS — I878 Other specified disorders of veins: Secondary | ICD-10-CM | POA: Diagnosis not present

## 2017-02-02 ENCOUNTER — Encounter: Payer: Self-pay | Admitting: Urology

## 2017-02-02 ENCOUNTER — Ambulatory Visit (INDEPENDENT_AMBULATORY_CARE_PROVIDER_SITE_OTHER): Payer: BLUE CROSS/BLUE SHIELD | Admitting: Urology

## 2017-02-02 VITALS — BP 105/71 | HR 67 | Ht 67.0 in | Wt 187.1 lb

## 2017-02-02 DIAGNOSIS — N200 Calculus of kidney: Secondary | ICD-10-CM

## 2017-02-02 DIAGNOSIS — J301 Allergic rhinitis due to pollen: Secondary | ICD-10-CM | POA: Diagnosis not present

## 2017-02-02 NOTE — Progress Notes (Signed)
02/02/2017 3:33 PM   Valerie Colon 31-Mar-1971 [id number]  Referring provider: Glendon Axe, MD [facility] [place], [place] [address]  Chief Complaint  Patient presents with  . Follow-up    HPI: Dr Sharin Grave: The patient has been treated for stones. When the patient was seen in the emergency room in May 2018 she had bilateral stones with no obstruction.  The patient went to the emergency room in May. She had bronchitis. The largest stone nonobstructing in the right is 6 or 7 mm and the other stones in both kidneys are much smaller. Clinically she has not been passing stones since. Frequency is stable. No fever. Clinically noninfected   PMH: Past Medical History:  Diagnosis Date  . Acid reflux   . Allergic rhinitis   . Anxiety   . Heart murmur   . History of hiatal hernia   . Kidney stones   . Sleep apnea     Surgical History: Past Surgical History:  Procedure Laterality Date  . ABDOMINAL HYSTERECTOMY    . BREAST BIOPSY Right 12/19/2013   stereo right  . CHOLECYSTECTOMY    . COLONOSCOPY WITH PROPOFOL N/A 05/05/2016   Procedure: COLONOSCOPY WITH PROPOFOL;  Surgeon: Lollie Sails, MD;  Location: [facility] ENDOSCOPY;  Service: Endoscopy;  Laterality: N/A;  . ESOPHAGOGASTRODUODENOSCOPY (EGD) WITH PROPOFOL N/A 05/05/2016   Procedure: ESOPHAGOGASTRODUODENOSCOPY (EGD) WITH PROPOFOL;  Surgeon: Lollie Sails, MD;  Location: [facility] ENDOSCOPY;  Service: Endoscopy;  Laterality: N/A;  . EXTRACORPOREAL SHOCK WAVE LITHOTRIPSY Left 11/06/2016   Procedure: EXTRACORPOREAL SHOCK WAVE LITHOTRIPSY (ESWL);  Surgeon: Nickie Retort, MD;  Location: [facility] ORS;  Service: Urology;  Laterality: Left;  . gallbladder  2012  . GASTRIC BYPASS  2012  . REDUCTION MAMMAPLASTY Bilateral    2010  . REDUCTION MAMMAPLASTY      Home Medications:  Allergies as of 02/02/2017   No Known Allergies     Medication List       Accurate as of 02/02/17  3:33 PM. Always use your  most recent med list.          cetirizine 10 MG tablet Commonly known as:  ZYRTEC Take 10 mg by mouth daily.   multivitamin tablet Take 1 tablet by mouth daily.   RA VITAMIN B-12 TR 1000 MCG Tbcr Generic drug:  Cyanocobalamin Take by mouth.       Allergies: No Known Allergies  Family History: Family History  Problem Relation Age of Onset  . Prostate cancer Father   . Kidney Stones Maternal Grandfather   . Kidney disease Neg Hx   . Bladder Cancer Neg Hx     Social History:  reports that she has never smoked. She has never used smokeless tobacco. She reports that she does not drink alcohol or use drugs.  ROS: UROLOGY Frequent Urination?: No Hard to postpone urination?: No Burning/pain with urination?: No Get up at night to urinate?: No Leakage of urine?: No Urine stream starts and stops?: No Trouble starting stream?: No Do you have to strain to urinate?: No Blood in urine?: No Urinary tract infection?: No Sexually transmitted disease?: No Injury to kidneys or bladder?: No Painful intercourse?: No Weak stream?: No Currently pregnant?: No Vaginal bleeding?: No Last menstrual period?: n  Gastrointestinal Nausea?: No Vomiting?: No Indigestion/heartburn?: No Diarrhea?: No Constipation?: No  Constitutional Fever: No Night sweats?: No Weight loss?: No Fatigue?: No  Skin Skin rash/lesions?: No Itching?: No  Eyes Blurred vision?: No Double vision?:  No  Ears/Nose/Throat Sore throat?: No Sinus problems?: No  Hematologic/Lymphatic Swollen glands?: No Easy bruising?: No  Cardiovascular Leg swelling?: No Chest pain?: No  Respiratory Cough?: No Shortness of breath?: No  Endocrine Excessive thirst?: No  Musculoskeletal Back pain?: No Joint pain?: No  Neurological Headaches?: No Dizziness?: No  Psychologic Depression?: No Anxiety?: No  Physical Exam: BP 105/71 (BP Location: Left Arm, Patient Position: Sitting, Cuff Size: Normal)    Pulse 67   Ht 5\' 7"  (1.702 m)   Wt 84.9 kg (187 lb 1.6 oz)   LMP  (LMP Unknown)   BMI 29.30 kg/m   Constitutional:  Alert and oriented, No acute distress.   Laboratory Data: Lab Results  Component Value Date   WBC 3.7 01/05/2017   HGB 13.0 01/05/2017   HCT 39.4 01/05/2017   MCV 94.0 01/05/2017   PLT 257 01/05/2017    Lab Results  Component Value Date   CREATININE 0.85 01/05/2017    No results found for: PSA  No results found for: TESTOSTERONE  No results found for: HGBA1C  Urinalysis    Component Value Date/Time   COLORURINE YELLOW (A) 01/05/2017 1552   APPEARANCEUR HAZY (A) 01/05/2017 1552   APPEARANCEUR Clear 10/30/2016 1211   LABSPEC 1.025 01/05/2017 1552   PHURINE 5.0 01/05/2017 1552   GLUCOSEU >=500 (A) 01/05/2017 1552   HGBUR SMALL (A) 01/05/2017 1552   BILIRUBINUR NEGATIVE 01/05/2017 1552   BILIRUBINUR Negative 10/30/2016 1211   KETONESUR 20 (A) 01/05/2017 1552   PROTEINUR NEGATIVE 01/05/2017 1552   NITRITE NEGATIVE 01/05/2017 1552   LEUKOCYTESUR NEGATIVE 01/05/2017 1552   LEUKOCYTESUR Negative 10/30/2016 1211    Pertinent Imaging: See above  Assessment & Plan:  The patient has had bilateral stone disease. Fluid modifications discussed. The patient has had lithotripsy once. I drew her a picture. We will see her when necessary  There are no diagnoses linked to this encounter.  No Follow-up on file.  Reece Packer, MD  [facility] [address] [place], [place] [address] [phone]

## 2017-02-05 DIAGNOSIS — J301 Allergic rhinitis due to pollen: Secondary | ICD-10-CM | POA: Diagnosis not present

## 2017-02-06 DIAGNOSIS — J301 Allergic rhinitis due to pollen: Secondary | ICD-10-CM | POA: Diagnosis not present

## 2017-02-09 DIAGNOSIS — J301 Allergic rhinitis due to pollen: Secondary | ICD-10-CM | POA: Diagnosis not present

## 2017-02-10 ENCOUNTER — Other Ambulatory Visit: Payer: Self-pay | Admitting: Internal Medicine

## 2017-02-16 DIAGNOSIS — J301 Allergic rhinitis due to pollen: Secondary | ICD-10-CM | POA: Diagnosis not present

## 2017-02-19 DIAGNOSIS — J301 Allergic rhinitis due to pollen: Secondary | ICD-10-CM | POA: Diagnosis not present

## 2017-02-23 DIAGNOSIS — J301 Allergic rhinitis due to pollen: Secondary | ICD-10-CM | POA: Diagnosis not present

## 2017-02-27 DIAGNOSIS — Z0000 Encounter for general adult medical examination without abnormal findings: Secondary | ICD-10-CM | POA: Diagnosis not present

## 2017-02-27 DIAGNOSIS — E559 Vitamin D deficiency, unspecified: Secondary | ICD-10-CM | POA: Diagnosis not present

## 2017-02-27 DIAGNOSIS — R7303 Prediabetes: Secondary | ICD-10-CM | POA: Diagnosis not present

## 2017-02-27 DIAGNOSIS — D649 Anemia, unspecified: Secondary | ICD-10-CM | POA: Diagnosis not present

## 2017-02-27 DIAGNOSIS — K219 Gastro-esophageal reflux disease without esophagitis: Secondary | ICD-10-CM | POA: Diagnosis not present

## 2017-03-02 ENCOUNTER — Other Ambulatory Visit: Payer: Self-pay | Admitting: Internal Medicine

## 2017-03-02 DIAGNOSIS — M25861 Other specified joint disorders, right knee: Secondary | ICD-10-CM | POA: Diagnosis not present

## 2017-03-02 DIAGNOSIS — J301 Allergic rhinitis due to pollen: Secondary | ICD-10-CM | POA: Diagnosis not present

## 2017-03-02 DIAGNOSIS — Z1231 Encounter for screening mammogram for malignant neoplasm of breast: Secondary | ICD-10-CM

## 2017-03-03 DIAGNOSIS — J301 Allergic rhinitis due to pollen: Secondary | ICD-10-CM | POA: Diagnosis not present

## 2017-03-05 DIAGNOSIS — J301 Allergic rhinitis due to pollen: Secondary | ICD-10-CM | POA: Diagnosis not present

## 2017-03-09 DIAGNOSIS — J301 Allergic rhinitis due to pollen: Secondary | ICD-10-CM | POA: Diagnosis not present

## 2017-03-12 DIAGNOSIS — J301 Allergic rhinitis due to pollen: Secondary | ICD-10-CM | POA: Diagnosis not present

## 2017-03-13 ENCOUNTER — Ambulatory Visit
Admission: RE | Admit: 2017-03-13 | Discharge: 2017-03-13 | Disposition: A | Discharge status: Home / Self Care | Payer: BLUE CROSS/BLUE SHIELD | Source: Ambulatory Visit | Attending: Internal Medicine | Admitting: Internal Medicine

## 2017-03-13 DIAGNOSIS — Z1231 Encounter for screening mammogram for malignant neoplasm of breast: Secondary | ICD-10-CM

## 2017-03-16 DIAGNOSIS — J301 Allergic rhinitis due to pollen: Secondary | ICD-10-CM | POA: Diagnosis not present

## 2017-03-19 DIAGNOSIS — J301 Allergic rhinitis due to pollen: Secondary | ICD-10-CM | POA: Diagnosis not present

## 2017-03-23 DIAGNOSIS — J301 Allergic rhinitis due to pollen: Secondary | ICD-10-CM | POA: Diagnosis not present

## 2017-03-26 DIAGNOSIS — J301 Allergic rhinitis due to pollen: Secondary | ICD-10-CM | POA: Diagnosis not present

## 2017-03-31 DIAGNOSIS — J301 Allergic rhinitis due to pollen: Secondary | ICD-10-CM | POA: Diagnosis not present

## 2017-05-22 ENCOUNTER — Ambulatory Visit: Payer: BLUE CROSS/BLUE SHIELD | Admitting: Urology

## 2017-06-05 DIAGNOSIS — J301 Allergic rhinitis due to pollen: Secondary | ICD-10-CM | POA: Diagnosis not present

## 2017-06-08 DIAGNOSIS — J301 Allergic rhinitis due to pollen: Secondary | ICD-10-CM | POA: Diagnosis not present

## 2017-06-15 DIAGNOSIS — M170 Bilateral primary osteoarthritis of knee: Secondary | ICD-10-CM | POA: Diagnosis not present

## 2017-07-03 DIAGNOSIS — M25561 Pain in right knee: Secondary | ICD-10-CM | POA: Diagnosis not present

## 2017-07-03 DIAGNOSIS — M222X9 Patellofemoral disorders, unspecified knee: Secondary | ICD-10-CM | POA: Diagnosis not present

## 2017-07-03 DIAGNOSIS — M25562 Pain in left knee: Secondary | ICD-10-CM | POA: Diagnosis not present

## 2017-07-31 DIAGNOSIS — R0683 Snoring: Secondary | ICD-10-CM | POA: Diagnosis not present

## 2017-07-31 DIAGNOSIS — M21922 Unspecified acquired deformity of left upper arm: Secondary | ICD-10-CM | POA: Diagnosis not present

## 2017-07-31 DIAGNOSIS — M79632 Pain in left forearm: Secondary | ICD-10-CM | POA: Diagnosis not present

## 2017-07-31 DIAGNOSIS — D500 Iron deficiency anemia secondary to blood loss (chronic): Secondary | ICD-10-CM | POA: Diagnosis not present

## 2017-07-31 DIAGNOSIS — E559 Vitamin D deficiency, unspecified: Secondary | ICD-10-CM | POA: Diagnosis not present

## 2017-07-31 HISTORY — DX: Snoring: R06.83

## 2017-08-21 DIAGNOSIS — J301 Allergic rhinitis due to pollen: Secondary | ICD-10-CM | POA: Diagnosis not present

## 2017-08-27 IMAGING — CR DG ABDOMEN 1V
1 series · 2 of 2 positions shown · non-contrast
Comparison: 11/06/2016

CLINICAL DATA: Kidney stone

EXAM:
ABDOMEN - 1 VIEW

[Series 1: dg abd 1 view · 0.14mm/px · 2 of 2 slices shown]
[im 1/2]
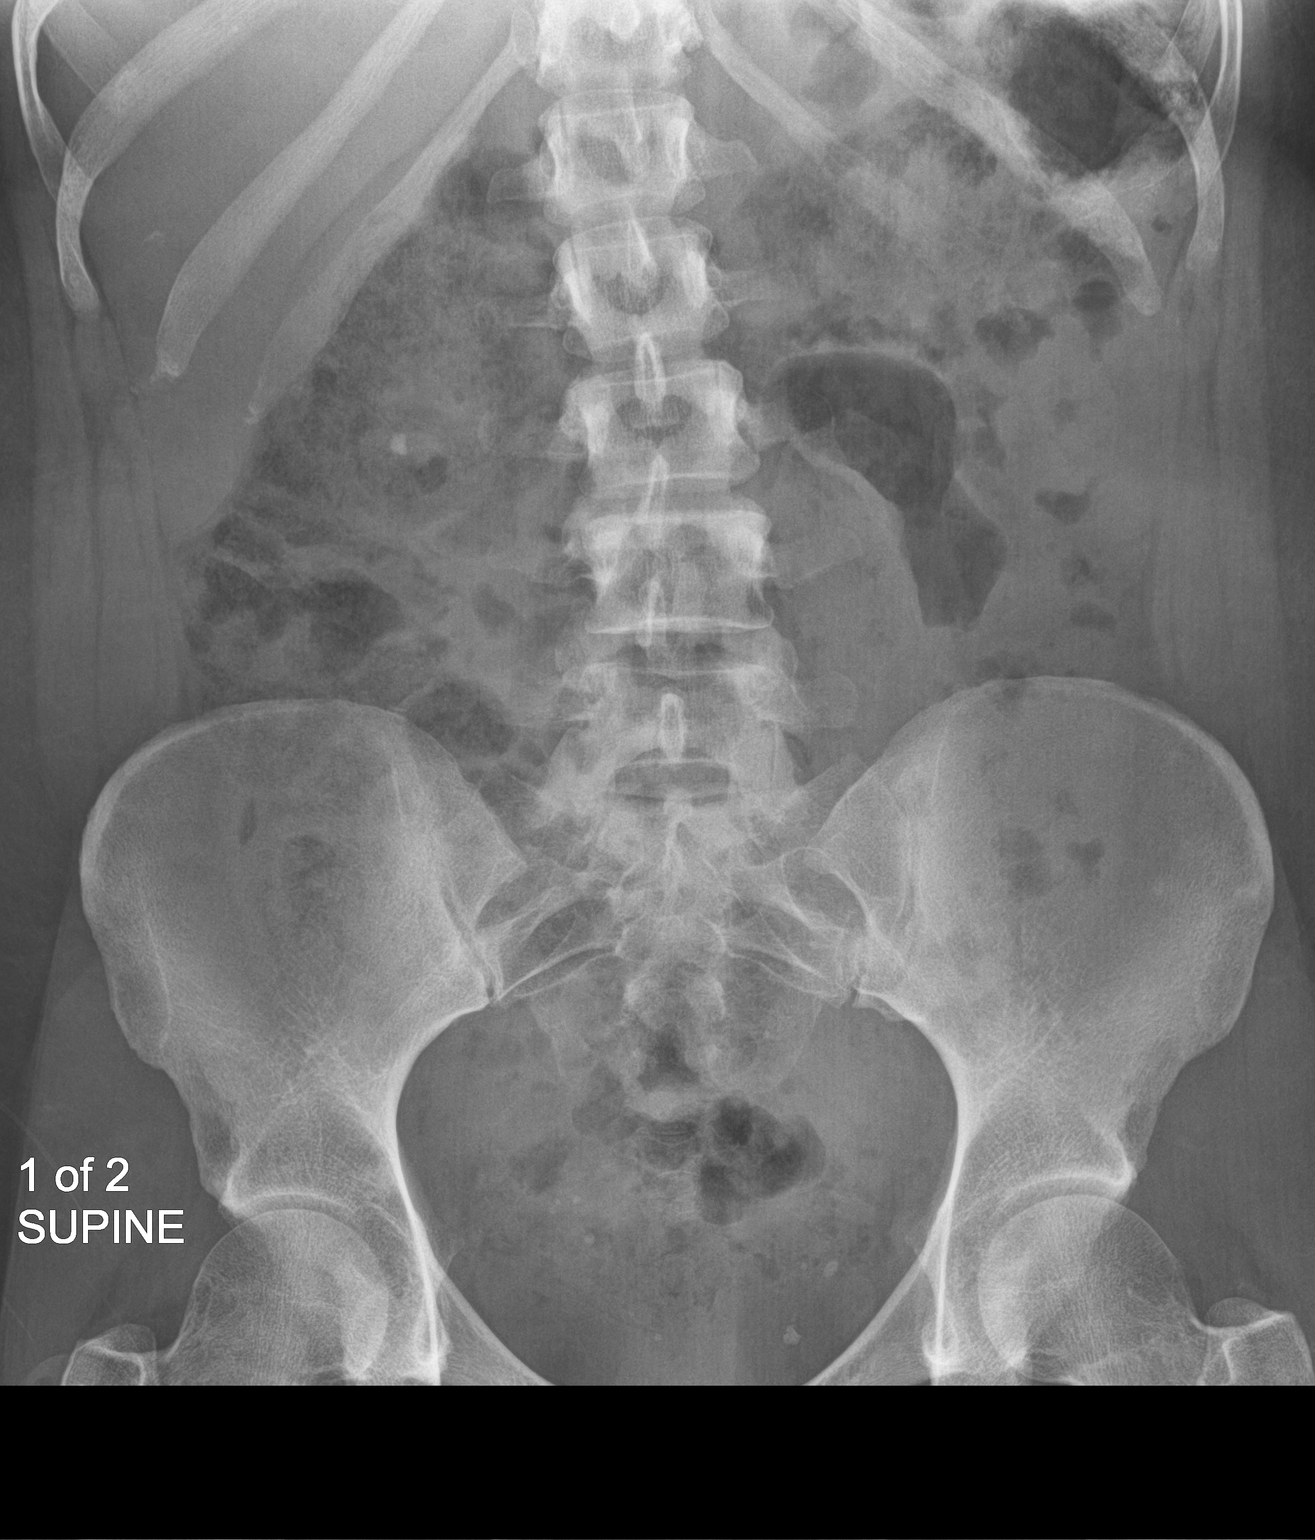
[im 2/2]
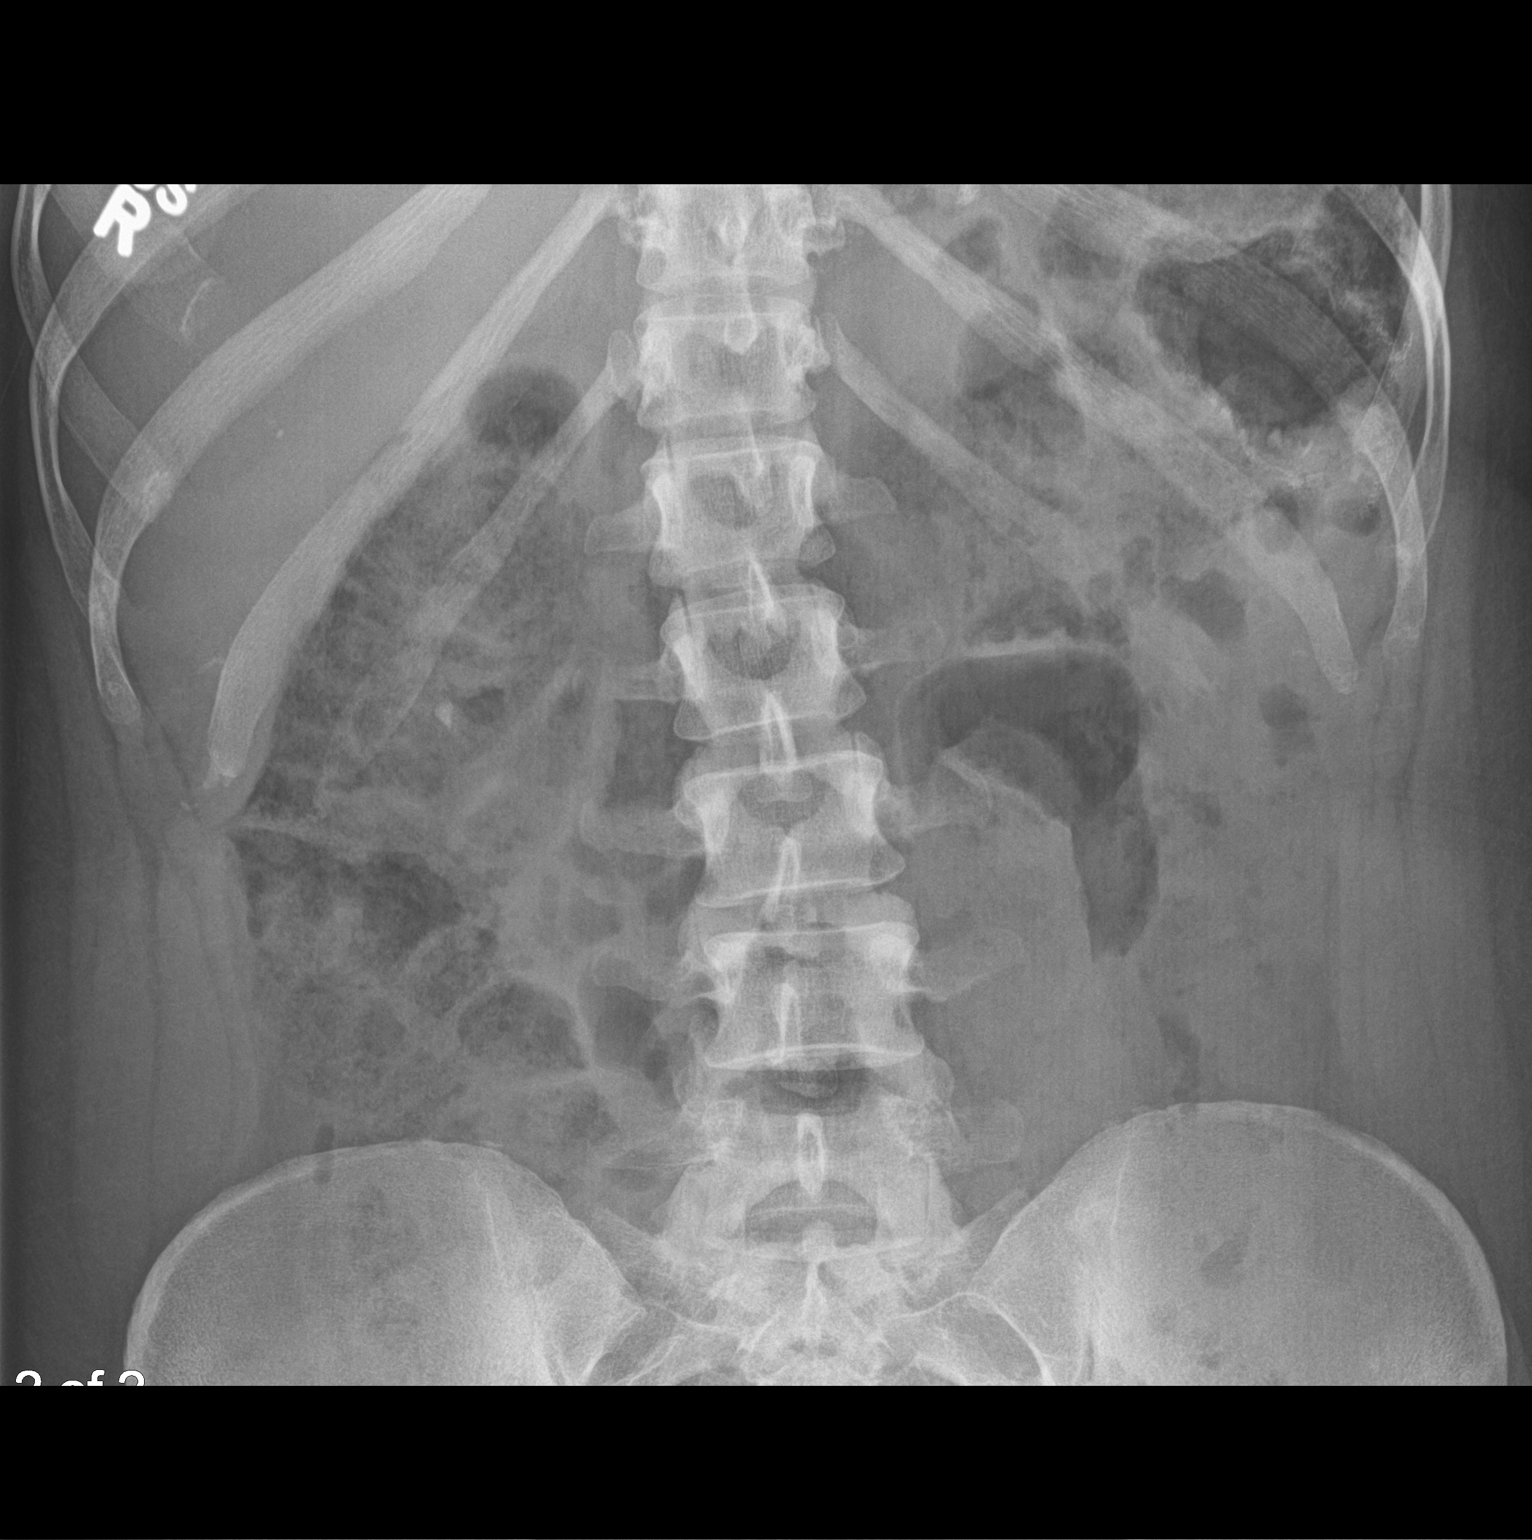

[2 of 2 positions shown; findings below may reference images not displayed]

FINDINGS: 6 mm right renal calcification is unchanged. Is overlies the midpole
on the right. No left renal calculi

Multiple calcifications the pelvis unchanged from the prior KUB.
Distal left ureteral calculus on the CT of 11/04/2016 appears to
have passed.

Moderate stool in the colon no bowel obstruction
IMPRESSION: 6 mm right renal calcification unchanged

Distal left ureteral calculus appears to have passed based on the
prior CT and KUB.

## 2017-09-03 DIAGNOSIS — R0683 Snoring: Secondary | ICD-10-CM | POA: Diagnosis not present

## 2017-09-05 DIAGNOSIS — G4733 Obstructive sleep apnea (adult) (pediatric): Secondary | ICD-10-CM | POA: Diagnosis not present

## 2017-09-07 DIAGNOSIS — J301 Allergic rhinitis due to pollen: Secondary | ICD-10-CM | POA: Diagnosis not present

## 2017-09-11 ENCOUNTER — Ambulatory Visit: Payer: BLUE CROSS/BLUE SHIELD | Admitting: Internal Medicine

## 2017-09-11 DIAGNOSIS — J301 Allergic rhinitis due to pollen: Secondary | ICD-10-CM | POA: Diagnosis not present

## 2017-09-11 DIAGNOSIS — H6121 Impacted cerumen, right ear: Secondary | ICD-10-CM | POA: Diagnosis not present

## 2017-09-28 DIAGNOSIS — J019 Acute sinusitis, unspecified: Secondary | ICD-10-CM | POA: Diagnosis not present

## 2017-09-28 DIAGNOSIS — H66001 Acute suppurative otitis media without spontaneous rupture of ear drum, right ear: Secondary | ICD-10-CM | POA: Diagnosis not present

## 2017-09-28 DIAGNOSIS — R51 Headache: Secondary | ICD-10-CM | POA: Diagnosis not present

## 2017-09-28 DIAGNOSIS — B373 Candidiasis of vulva and vagina: Secondary | ICD-10-CM | POA: Diagnosis not present

## 2017-10-02 DIAGNOSIS — R51 Headache: Secondary | ICD-10-CM | POA: Diagnosis not present

## 2017-10-02 DIAGNOSIS — R7303 Prediabetes: Secondary | ICD-10-CM | POA: Diagnosis not present

## 2017-10-02 DIAGNOSIS — R519 Headache, unspecified: Secondary | ICD-10-CM

## 2017-10-02 DIAGNOSIS — K219 Gastro-esophageal reflux disease without esophagitis: Secondary | ICD-10-CM | POA: Diagnosis not present

## 2017-10-02 HISTORY — DX: Headache, unspecified: R51.9

## 2017-10-08 ENCOUNTER — Ambulatory Visit: Payer: BLUE CROSS/BLUE SHIELD | Admitting: Internal Medicine

## 2017-10-09 ENCOUNTER — Encounter (INDEPENDENT_AMBULATORY_CARE_PROVIDER_SITE_OTHER): Payer: Self-pay

## 2017-10-09 ENCOUNTER — Encounter: Payer: Self-pay | Admitting: Internal Medicine

## 2017-10-09 ENCOUNTER — Ambulatory Visit: Payer: BLUE CROSS/BLUE SHIELD | Admitting: Internal Medicine

## 2017-10-09 VITALS — BP 104/60 | HR 98 | Temp 98.0°F | Ht 67.0 in | Wt 179.6 lb

## 2017-10-09 DIAGNOSIS — M25511 Pain in right shoulder: Secondary | ICD-10-CM

## 2017-10-09 DIAGNOSIS — K2950 Unspecified chronic gastritis without bleeding: Secondary | ICD-10-CM

## 2017-10-09 DIAGNOSIS — E611 Iron deficiency: Secondary | ICD-10-CM

## 2017-10-09 DIAGNOSIS — Z9884 Bariatric surgery status: Secondary | ICD-10-CM

## 2017-10-09 DIAGNOSIS — K219 Gastro-esophageal reflux disease without esophagitis: Secondary | ICD-10-CM

## 2017-10-09 DIAGNOSIS — G43009 Migraine without aura, not intractable, without status migrainosus: Secondary | ICD-10-CM | POA: Diagnosis not present

## 2017-10-09 DIAGNOSIS — K912 Postsurgical malabsorption, not elsewhere classified: Secondary | ICD-10-CM

## 2017-10-09 DIAGNOSIS — Z1159 Encounter for screening for other viral diseases: Secondary | ICD-10-CM

## 2017-10-09 DIAGNOSIS — D649 Anemia, unspecified: Secondary | ICD-10-CM

## 2017-10-09 DIAGNOSIS — R7303 Prediabetes: Secondary | ICD-10-CM | POA: Diagnosis not present

## 2017-10-09 DIAGNOSIS — M799 Soft tissue disorder, unspecified: Secondary | ICD-10-CM

## 2017-10-09 DIAGNOSIS — M25522 Pain in left elbow: Secondary | ICD-10-CM | POA: Diagnosis not present

## 2017-10-09 DIAGNOSIS — E559 Vitamin D deficiency, unspecified: Secondary | ICD-10-CM

## 2017-10-09 DIAGNOSIS — N200 Calculus of kidney: Secondary | ICD-10-CM

## 2017-10-09 DIAGNOSIS — G4733 Obstructive sleep apnea (adult) (pediatric): Secondary | ICD-10-CM

## 2017-10-09 DIAGNOSIS — E538 Deficiency of other specified B group vitamins: Secondary | ICD-10-CM

## 2017-10-09 DIAGNOSIS — Z1329 Encounter for screening for other suspected endocrine disorder: Secondary | ICD-10-CM

## 2017-10-09 DIAGNOSIS — R921 Mammographic calcification found on diagnostic imaging of breast: Secondary | ICD-10-CM

## 2017-10-09 DIAGNOSIS — R102 Pelvic and perineal pain unspecified side: Secondary | ICD-10-CM

## 2017-10-09 DIAGNOSIS — M25561 Pain in right knee: Secondary | ICD-10-CM

## 2017-10-09 DIAGNOSIS — M7989 Other specified soft tissue disorders: Secondary | ICD-10-CM

## 2017-10-09 DIAGNOSIS — A048 Other specified bacterial intestinal infections: Secondary | ICD-10-CM | POA: Diagnosis not present

## 2017-10-09 DIAGNOSIS — G8929 Other chronic pain: Secondary | ICD-10-CM

## 2017-10-09 DIAGNOSIS — M25562 Pain in left knee: Secondary | ICD-10-CM

## 2017-10-09 HISTORY — DX: Migraine without aura, not intractable, without status migrainosus: G43.009

## 2017-10-09 HISTORY — DX: Pain in right shoulder: M25.511

## 2017-10-09 HISTORY — DX: Unspecified chronic gastritis without bleeding: K29.50

## 2017-10-09 HISTORY — DX: Pelvic and perineal pain: R10.2

## 2017-10-09 HISTORY — DX: Soft tissue disorder, unspecified: M79.9

## 2017-10-09 HISTORY — DX: Other chronic pain: G89.29

## 2017-10-09 LAB — URINALYSIS, ROUTINE W REFLEX MICROSCOPIC
Bilirubin Urine: NEGATIVE
KETONES UR: NEGATIVE
Leukocytes, UA: NEGATIVE
Nitrite: NEGATIVE
Specific Gravity, Urine: >=1.03 — AB (ref 1.000–1.030)
Total Protein, Urine: NEGATIVE
URINE GLUCOSE: NEGATIVE
UROBILINOGEN UA: 0.2 (ref 0.0–1.0)
pH: 5.5 (ref 5.0–8.0)

## 2017-10-09 LAB — COMPREHENSIVE METABOLIC PANEL
ALT: 12 U/L (ref 0–35)
AST: 18 U/L (ref 0–37)
Albumin: 4 g/dL (ref 3.5–5.2)
Alkaline Phosphatase: 75 U/L (ref 39–117)
BUN: 15 mg/dL (ref 6–23)
CHLORIDE: 103 meq/L (ref 96–112)
CO2: 27 meq/L (ref 19–32)
Calcium: 9.1 mg/dL (ref 8.4–10.5)
Creatinine, Ser: 0.8 mg/dL (ref 0.40–1.20)
GFR: 99.29 mL/min (ref >60.00)
GLUCOSE: 85 mg/dL (ref 70–99)
POTASSIUM: 4.2 meq/L (ref 3.5–5.1)
SODIUM: 138 meq/L (ref 135–145)
Total Bilirubin: 0.3 mg/dL (ref 0.2–1.2)
Total Protein: 7.8 g/dL (ref 6.0–8.3)

## 2017-10-09 LAB — VITAMIN B12: Vitamin B-12: 303 pg/mL (ref 211–911)

## 2017-10-09 LAB — CBC WITH DIFFERENTIAL/PLATELET
Basophils Absolute: 0 10*3/uL (ref 0.0–0.1)
Basophils Relative: 0.2 % (ref 0.0–3.0)
EOS PCT: 0.9 % (ref 0.0–5.0)
Eosinophils Absolute: 0.1 10*3/uL (ref 0.0–0.7)
HCT: 38.7 % (ref 36.0–46.0)
HEMOGLOBIN: 12.8 g/dL (ref 12.0–15.0)
LYMPHS ABS: 1.6 10*3/uL (ref 0.7–4.0)
Lymphocytes Relative: 19.8 % (ref 12.0–46.0)
MCHC: 33 g/dL (ref 30.0–36.0)
MCV: 94.8 fl (ref 78.0–100.0)
MONO ABS: 0.6 10*3/uL (ref 0.1–1.0)
Monocytes Relative: 7.8 % (ref 3.0–12.0)
NEUTROS ABS: 5.9 10*3/uL (ref 1.4–7.7)
Neutrophils Relative %: 71.3 % (ref 43.0–77.0)
Platelets: 340 10*3/uL (ref 150.0–400.0)
RBC: 4.08 Mil/uL (ref 3.87–5.11)
RDW: 14.4 % (ref 11.5–15.5)
WBC: 8.3 10*3/uL (ref 4.0–10.5)

## 2017-10-09 LAB — VITAMIN D 25 HYDROXY (VIT D DEFICIENCY, FRACTURES): VITD: 21.35 ng/mL — ABNORMAL LOW (ref 30.00–100.00)

## 2017-10-09 LAB — HEMOGLOBIN A1C: Hgb A1c MFr Bld: 6 % (ref 4.6–6.5)

## 2017-10-09 LAB — T4, FREE: Free T4: 0.72 ng/dL (ref 0.60–1.60)

## 2017-10-09 LAB — TSH: TSH: 0.72 u[IU]/mL (ref 0.35–4.50)

## 2017-10-09 MED ORDER — SUMATRIPTAN SUCCINATE 25 MG PO TABS: 25.0000 mg | ORAL_TABLET | ORAL | 0 refills | Status: DC | PRN

## 2017-10-09 MED ORDER — PANTOPRAZOLE SODIUM 20 MG PO TBEC: 20.0000 mg | DELAYED_RELEASE_TABLET | Freq: Every day | ORAL | 1 refills | Status: DC

## 2017-10-09 NOTE — Patient Instructions (Signed)
F/u in 3-4 weeks Try Imitrex for migraine headaches  We will do MRI left elbow  Take care    Migraine Headache A migraine headache is an intense, throbbing pain on one side or both sides of the head. Migraines may also cause other symptoms, such as nausea, vomiting, and sensitivity to light and noise. What are the causes? Doing or taking certain things may also trigger migraines, such as:  Alcohol.  Smoking.  Medicines, such as: ? Medicine used to treat chest pain (nitroglycerine). ? Birth control pills. ? Estrogen pills. ? Certain blood pressure medicines.  Aged cheeses, chocolate, or caffeine.  Foods or drinks that contain nitrates, glutamate, aspartame, or tyramine.  Physical activity.  Other things that may trigger a migraine include:  Menstruation.  Pregnancy.  Hunger.  Stress, lack of sleep, too much sleep, or fatigue.  Weather changes.  What increases the risk? The following factors may make you more likely to experience migraine headaches:  Age. Risk increases with age.  Family history of migraine headaches.  Being Caucasian.  Depression and anxiety.  Obesity.  Being a woman.  Having a hole in the heart (patent foramen ovale) or other heart problems.  What are the signs or symptoms? The main symptom of this condition is pulsating or throbbing pain. Pain may:  Happen in any area of the head, such as on one side or both sides.  Interfere with daily activities.  Get worse with physical activity.  Get worse with exposure to bright lights or loud noises.  Other symptoms may include:  Nausea.  Vomiting.  Dizziness.  General sensitivity to bright lights, loud noises, or smells.  Before you get a migraine, you may get warning signs that a migraine is developing (aura). An aura may include:  Seeing flashing lights or having blind spots.  Seeing bright spots, halos, or zigzag lines.  Having tunnel vision or blurred vision.  Having  numbness or a tingling feeling.  Having trouble talking.  Having muscle weakness.  How is this diagnosed? A migraine headache can be diagnosed based on:  Your symptoms.  A physical exam.  Tests, such as CT scan or MRI of the head. These imaging tests can help rule out other causes of headaches.  Taking fluid from the spine (lumbar puncture) and analyzing it (cerebrospinal fluid analysis, or CSF analysis).  How is this treated? A migraine headache is usually treated with medicines that:  Relieve pain.  Relieve nausea.  Prevent migraines from coming back.  Treatment may also include:  Acupuncture.  Lifestyle changes like avoiding foods that trigger migraines.  Follow these instructions at home: Medicines  Take over-the-counter and prescription medicines only as told by your health care provider.  Do not drive or use heavy machinery while taking prescription pain medicine.  To prevent or treat constipation while you are taking prescription pain medicine, your health care provider may recommend that you: ? Drink enough fluid to keep your urine clear or pale yellow. ? Take over-the-counter or prescription medicines. ? Eat foods that are high in fiber, such as fresh fruits and vegetables, whole grains, and beans. ? Limit foods that are high in fat and processed sugars, such as fried and sweet foods. Lifestyle  Avoid alcohol use.  Do not use any products that contain nicotine or tobacco, such as cigarettes and e-cigarettes. If you need help quitting, ask your health care provider.  Get at least 8 hours of sleep every night.  Limit your stress. General instructions  Keep a journal to find out what may trigger your migraine headaches. For example, write down: ? What you eat and drink. ? How much sleep you get. ? Any change to your diet or medicines.  If you have a migraine: ? Avoid things that make your symptoms worse, such as bright lights. ? It may help to lie  down in a dark, quiet room. ? Do not drive or use heavy machinery. ? Ask your health care provider what activities are safe for you while you are experiencing symptoms.  Keep all follow-up visits as told by your health care provider. This is important. Contact a health care provider if:  You develop symptoms that are different or more severe than your usual migraine symptoms. Get help right away if:  Your migraine becomes severe.  You have a fever.  You have a stiff neck.  You have vision loss.  Your muscles feel weak or like you cannot control them.  You start to lose your balance often.  You develop trouble walking.  You faint. This information is not intended to replace advice given to you by your health care provider. Make sure you discuss any questions you have with your health care provider. Document Released: 08/04/2005 Document Revised: 02/22/2016 Document Reviewed: 01/21/2016 Elsevier Interactive Patient Education  2017 [employer].

## 2017-10-09 NOTE — Progress Notes (Signed)
Pre visit review using our clinic review tool, if applicable. No additional management support is needed unless otherwise documented below in the visit note. 

## 2017-10-10 LAB — IRON,TIBC AND FERRITIN PANEL
%SAT: 15 % (calc) (ref 11–50)
Ferritin: 18 ng/mL (ref 10–232)
IRON: 63 ug/dL (ref 40–190)
TIBC: 409 ug/dL (ref 250–450)

## 2017-10-10 LAB — HEPATITIS B SURFACE ANTIBODY, QUANTITATIVE: Hepatitis B-Post: 26 m[IU]/mL (ref >=10)

## 2017-10-13 ENCOUNTER — Encounter: Payer: Self-pay | Admitting: Internal Medicine

## 2017-10-13 DIAGNOSIS — A048 Other specified bacterial intestinal infections: Secondary | ICD-10-CM | POA: Diagnosis not present

## 2017-10-13 NOTE — Progress Notes (Signed)
Chief Complaint  Patient presents with  . Establish Care   New patient former PCP Dr. Paticia Stack  1. H/o hypoglycemia since gastric bypass surgery intermittently Dr. Darnell Level was her surgeon. She had breakfast and lunch today but reports she was shaky today so had to stop at store and get candy  2. H/o recurrent kidney stones s/p lithotripsy x 1.  3. C/o migraine h/as lasts 2 hrs x 2 days no help with tylenol. Getting h/a 3-5 x per week last yesterday and the day before this appt today. H/a throbbing. H/a is frontal b/l. Tried BC powder with some relief and had been given Toradol at Urgent care and amoxicillin b/c they thought h/a was 2/2 sinus issues. No prior h/o migraine h/as.  No aura with h/as. She is sleeping 8-9 hrs qhs and drinking 1 soda/day.  4. Partner c/o she stops breathing at night she had sleep study and had h/o OSA before wt loss surgery but per pt last sleep study neg will get copy of sleep study  5. C/o left elbow lesion x 6 months recent Xray neg per last PCP will need to get copy of Xray. Size is increasing.  At times lesion is painful.   6. C/o pain in b/l knees and right shoulder.   7. H/o allergies/allergic rhintis and gets shots with Dr. Richardson Landry since mid 2018  8. H/o b/l lower pelvic pain intermittently s/p hysterectomy for fibroids/DUB, endometriosis 12-13 years ago sees [facility] . Still has right ovary.   9. H/o right breast marker s/p breast reduction and calcifications in breast FH calcifications in breast FH g aunts x 2 breast cancer mammo due 03/13/18 last 02/2017 neg    Review of Systems  Constitutional: Negative for weight loss.  HENT: Negative for hearing loss.   Eyes: Negative for blurred vision.  Respiratory: Positive for shortness of breath.        Stopping breathing qhs  Cardiovascular: Negative for chest pain.  Gastrointestinal: Positive for abdominal pain.  Musculoskeletal: Positive for joint pain.  Skin: Negative for rash.  Neurological: Positive for  headaches.  Endo/Heme/Allergies:       +hypoglycemia   Psychiatric/Behavioral: Negative for depression. The patient does not have insomnia.    Past Medical History:  Diagnosis Date  . Acid reflux   . Allergic rhinitis   . Allergy   . Anxiety   . Heart murmur   . History of hiatal hernia   . Kidney stones   . Migraine   . Sleep apnea    Past Surgical History:  Procedure Laterality Date  . ABDOMINAL HYSTERECTOMY     2008  . BREAST BIOPSY Right 12/19/2013   stereo right- neg  . CHOLECYSTECTOMY    . COLONOSCOPY WITH PROPOFOL N/A 05/05/2016   Procedure: COLONOSCOPY WITH PROPOFOL;  Surgeon: Lollie Sails, MD;  Location: [facility] ENDOSCOPY;  Service: Endoscopy;  Laterality: N/A;  . ESOPHAGOGASTRODUODENOSCOPY (EGD) WITH PROPOFOL N/A 05/05/2016   Procedure: ESOPHAGOGASTRODUODENOSCOPY (EGD) WITH PROPOFOL;  Surgeon: Lollie Sails, MD;  Location: [facility] ENDOSCOPY;  Service: Endoscopy;  Laterality: N/A;  . EXTRACORPOREAL SHOCK WAVE LITHOTRIPSY Left 11/06/2016   Procedure: EXTRACORPOREAL SHOCK WAVE LITHOTRIPSY (ESWL);  Surgeon: Nickie Retort, MD;  Location: [facility] ORS;  Service: Urology;  Laterality: Left;  . gallbladder  2012  . GASTRIC BYPASS  2012  . REDUCTION MAMMAPLASTY Bilateral    2010  . REDUCTION MAMMAPLASTY     Family History  Problem Relation Age of Onset  . Prostate  cancer Father   . Hyperlipidemia Father   . Hypertension Father   . Depression Father   . Kidney Stones Maternal Grandfather   . Stroke Maternal Grandfather   . Cancer Maternal Grandfather        prostate cancer   . Diabetes Mother   . Hyperlipidemia Mother   . Hypertension Mother   . Heart disease Maternal Grandmother   . Kidney disease Neg Hx   . Bladder Cancer Neg Hx   . Breast cancer Neg Hx    Social History   Socioeconomic History  . Marital status: Married    Spouse name: Not on file  . Number of children: Not on file  . Years of education: Not on file  . Highest education level: Not on  file  Social Needs  . Financial resource strain: Not on file  . Food insecurity - worry: Not on file  . Food insecurity - inability: Not on file  . Transportation needs - medical: Not on file  . Transportation needs - non-medical: Not on file  Occupational History  . Not on file  Tobacco Use  . Smoking status: Never Smoker  . Smokeless tobacco: Never Used  Substance and Sexual Activity  . Alcohol use: No    Alcohol/week: 0.0 oz  . Drug use: No  . Sexual activity: Yes    Comment: female partner   Other Topics Concern  . Not on file  Social History Narrative   Works in [employer]    Married to significant other    Current Meds  Medication Sig  . albuterol (PROAIR HFA) 108 (90 Base) MCG/ACT inhaler ProAir HFA 90 mcg/actuation aerosol inhaler  INHALE 2 PUFFS PO PRF WHEEZING OR SHORTNESS OF BREATH  . ALLERGIST TRAY 1CC 27GX3/8" 27G X 3/8" 1 ML KIT   . cetirizine (ZYRTEC) 10 MG tablet Take 10 mg by mouth daily.  . Cyanocobalamin (RA VITAMIN B-12 TR) 1000 MCG TBCR Take by mouth.  . fluconazole (DIFLUCAN) 150 MG tablet   . HYDROMET 5-1.5 MG/5ML syrup TK 5 ML PO Q 6 H PRN COU  . Influenza vac split quadrivalent PF (FLUARIX QUADRIVALENT) 0.5 ML injection Fluarix Quad 2018-2019 (PF) 60 mcg (15 mcg x 4)/0.5 mL IM syringe  ADM 0.5ML IM UTD  . Multiple Vitamin (MULTIVITAMIN) tablet Take 1 tablet by mouth daily.  . promethazine (PHENERGAN) 25 MG tablet TK 1 T PO Q 6 H PRN N OR MILD DISCOMFORT  . [DISCONTINUED] meloxicam (MOBIC) 15 MG tablet meloxicam 15 mg tablet  TK 1 T PO QD   No Known Allergies Recent Results (from the past 2160 hour(s))  Comprehensive metabolic panel     Status: None   Collection Time: 10/09/17  2:30 PM  Result Value Ref Range   Sodium 138 135 - 145 mEq/L   Potassium 4.2 3.5 - 5.1 mEq/L   Chloride 103 96 - 112 mEq/L   CO2 27 19 - 32 mEq/L   Glucose, Bld 85 70 - 99 mg/dL   BUN 15 6 - 23 mg/dL   Creatinine, Ser 0.80 0.40 -  1.20 mg/dL   Total Bilirubin 0.3 0.2 - 1.2 mg/dL   Alkaline Phosphatase 75 39 - 117 U/L   AST 18 0 - 37 U/L   ALT 12 0 - 35 U/L   Total Protein 7.8 6.0 - 8.3 g/dL   Albumin 4.0 3.5 - 5.2 g/dL   Calcium 9.1 8.4 - 10.5  mg/dL   GFR 99.29 >60.00 mL/min  CBC with Differential/Platelet     Status: None   Collection Time: 10/09/17  2:30 PM  Result Value Ref Range   WBC 8.3 4.0 - 10.5 K/uL   RBC 4.08 3.87 - 5.11 Mil/uL   Hemoglobin 12.8 12.0 - 15.0 g/dL   HCT 38.7 36.0 - 46.0 %   MCV 94.8 78.0 - 100.0 fl   MCHC 33.0 30.0 - 36.0 g/dL   RDW 14.4 11.5 - 15.5 %   Platelets 340.0 150.0 - 400.0 K/uL   Neutrophils Relative % 71.3 43.0 - 77.0 %   Lymphocytes Relative 19.8 12.0 - 46.0 %   Monocytes Relative 7.8 3.0 - 12.0 %   Eosinophils Relative 0.9 0.0 - 5.0 %   Basophils Relative 0.2 0.0 - 3.0 %   Neutro Abs 5.9 1.4 - 7.7 K/uL   Lymphs Abs 1.6 0.7 - 4.0 K/uL   Monocytes Absolute 0.6 0.1 - 1.0 K/uL   Eosinophils Absolute 0.1 0.0 - 0.7 K/uL   Basophils Absolute 0.0 0.0 - 0.1 K/uL  Iron, TIBC and Ferritin Panel     Status: None   Collection Time: 10/09/17  2:30 PM  Result Value Ref Range   Iron 63 40 - 190 mcg/dL   TIBC 409 250 - 450 mcg/dL (calc)   %SAT 15 11 - 50 % (calc)   Ferritin 18 10 - 232 ng/mL  Hemoglobin A1c     Status: None   Collection Time: 10/09/17  2:30 PM  Result Value Ref Range   Hgb A1c MFr Bld 6.0 4.6 - 6.5 %    Comment: Glycemic Control Guidelines for People with Diabetes:Non Diabetic:  <6%Goal of Therapy: <7%Additional Action Suggested:  >8%   Vitamin D (25 hydroxy)     Status: Abnormal   Collection Time: 10/09/17  2:30 PM  Result Value Ref Range   VITD 21.35 (L) 30.00 - 100.00 ng/mL  Urinalysis, Routine w reflex microscopic     Status: Abnormal   Collection Time: 10/09/17  2:30 PM  Result Value Ref Range   Color, Urine YELLOW Yellow;Lt. Yellow   APPearance CLEAR Clear   Specific Gravity, Urine >=1.030 (A) 1.000 - 1.030   pH 5.5 5.0 - 8.0   Total Protein,  Urine NEGATIVE Negative   Urine Glucose NEGATIVE Negative   Ketones, ur NEGATIVE Negative   Bilirubin Urine NEGATIVE Negative   Hgb urine dipstick TRACE-INTACT (A) Negative   Urobilinogen, UA 0.2 0.0 - 1.0   Leukocytes, UA NEGATIVE Negative   Nitrite NEGATIVE Negative   WBC, UA 0-2/hpf 0-2/hpf   RBC / HPF 3-6/hpf (A) 0-2/hpf   Squamous Epithelial / LPF Rare(0-4/hpf) Rare(0-4/hpf)   Bacteria, UA Rare(<10/hpf) (A) None  TSH     Status: None   Collection Time: 10/09/17  2:30 PM  Result Value Ref Range   TSH 0.72 0.35 - 4.50 uIU/mL  T4, free     Status: None   Collection Time: 10/09/17  2:30 PM  Result Value Ref Range   Free T4 0.72 0.60 - 1.60 ng/dL    Comment: Specimens from patients who are undergoing biotin therapy and /or ingesting biotin supplements may contain high levels of biotin.  The higher biotin concentration in these specimens interferes with this Free T4 assay.  Specimens that contain high levels  of biotin may cause false high results for this Free T4 assay.  Please interpret results in light of the total clinical presentation of the patient.  Hepatitis B surface antibody     Status: None   Collection Time: 10/09/17  2:30 PM  Result Value Ref Range   Hepatitis B-Post 26 > OR = 10 mIU/mL    Comment: . Patient has immunity to hepatitis B virus. . For additional information, please refer to http://education.questdiagnostics.com/faq/FAQ105 (This link is being provided for informational/ educational purposes only).   B12     Status: None   Collection Time: 10/09/17  2:30 PM  Result Value Ref Range   Vitamin B-12 303 211 - 911 pg/mL   Objective  Body mass index is 28.13 kg/m. Wt Readings from Last 3 Encounters:  10/09/17 179 lb 9.6 oz (81.5 kg)  02/02/17 187 lb 1.6 oz (84.9 kg)  01/05/17 170 lb (77.1 kg)   Temp Readings from Last 3 Encounters:  10/09/17 98 F (36.7 C) (Oral)  01/05/17 98.1 F (36.7 C) (Oral)  11/06/16 97.5 F (36.4 C) (Tympanic)   BP  Readings from Last 3 Encounters:  10/09/17 104/60  02/02/17 105/71  01/05/17 101/69   Pulse Readings from Last 3 Encounters:  10/09/17 98  02/02/17 67  01/05/17 76  O2 sat room ait 99%   Physical Exam  Constitutional: She is oriented to person, place, and time and well-developed, well-nourished, and in no distress. Vital signs are normal.  HENT:  Head: Normocephalic and atraumatic.  Mouth/Throat: Oropharynx is clear and moist and mucous membranes are normal.  Eyes: Conjunctivae are normal. Pupils are equal, round, and reactive to light.  Cardiovascular: Normal rate, regular rhythm and normal heart sounds.  Pulmonary/Chest: Effort normal and breath sounds normal.  Abdominal: Soft.  Musculoskeletal:       Arms: Neurological: She is alert and oriented to person, place, and time. Gait normal. Gait normal.  CN 2-12 grossly intact   Skin: Skin is warm, dry and intact.  Tattoos  Tongue piercing and left arm piercing   Psychiatric: Mood, memory, affect and judgment normal.  Nursing note and vitals reviewed.   Assessment   1. Migraines w/o aura 2. Left elbow firm lesion 1-2 cm  3. Hypoglycemia s/p gastric bypass  4. H/o recurrent kidney stones s/p lithotripsy x 1  5. H/o OSA no longer on CPAP c/w apnea with sleep  6. Knee and right shoulder pain  7. Lower pelvic pain s/p hysterectomy only right ovary intact  8. H/o right breast calcifications  9.h/o H pylori, chronic gastritis GI erosion  10. H M Plan   1.  [facility] neurology called pt today will sch appt  Trial of imitrex 25 prn avoid NSAID with h/o erosion anastamosis, chronic gastritis on EGD 04/2016 2.  MRI elbow, left hold on surgery referral  Get copy of Xray old PCP 3.  Disc glucose tablets 4. Est with [facility] urology  5.  Get copy of OSA recent PCP 6. Disc at f/u  7.  Consider imaging if continues last imaging CT/MRI 2012 reviewed  8.  mammo due 03/13/18  9.  Repeat stool H pylori for clearance  Resume  protonix 20 mg qd   10.  Check labs today will need to check lipid in future   Had flu shot 07/04/17 Tdap had 01/24/16  Check hep B titer   S/p hysterectomy  mammo due 03/13/18  Colonoscopy had 04/2016 [facility] repeat rec in 5 years [facility]   30-40 minutes spent with pt disc multiple complaints, going on PMH/PsuH and reviewing current tx plan   Provider: Dr. Olivia Mackie McLean-Scocuzza-Internal Medicine

## 2017-10-13 NOTE — Addendum Note (Signed)
Addended by: Arby Barrette on: 10/13/2017 02:38 PM   Modules accepted: Orders

## 2017-10-14 ENCOUNTER — Encounter: Payer: Self-pay | Admitting: Internal Medicine

## 2017-10-14 ENCOUNTER — Other Ambulatory Visit: Payer: Self-pay | Admitting: Internal Medicine

## 2017-10-14 DIAGNOSIS — E559 Vitamin D deficiency, unspecified: Secondary | ICD-10-CM

## 2017-10-14 DIAGNOSIS — R1013 Epigastric pain: Secondary | ICD-10-CM

## 2017-10-14 DIAGNOSIS — K2950 Unspecified chronic gastritis without bleeding: Secondary | ICD-10-CM

## 2017-10-14 MED ORDER — CHOLECALCIFEROL 1.25 MG (50000 UT) PO CAPS: 50000.0000 [IU] | ORAL_CAPSULE | ORAL | 1 refills | Status: DC

## 2017-10-16 LAB — H. PYLORI ANTIGEN, STOOL: H pylori Ag, Stl: NEGATIVE

## 2017-10-18 DIAGNOSIS — N202 Calculus of kidney with calculus of ureter: Secondary | ICD-10-CM | POA: Diagnosis not present

## 2017-10-20 ENCOUNTER — Other Ambulatory Visit: Payer: Self-pay | Admitting: Internal Medicine

## 2017-10-20 NOTE — Progress Notes (Signed)
Sleep study 09/03/17 neg OSA no tx required reviewed   [place]

## 2017-10-21 ENCOUNTER — Ambulatory Visit
Admission: RE | Admit: 2017-10-21 | Discharge: 2017-10-21 | Disposition: A | Discharge status: Home / Self Care | Payer: BLUE CROSS/BLUE SHIELD | Source: Ambulatory Visit | Attending: Internal Medicine | Admitting: Internal Medicine

## 2017-10-21 DIAGNOSIS — R600 Localized edema: Secondary | ICD-10-CM | POA: Diagnosis not present

## 2017-10-21 DIAGNOSIS — M25522 Pain in left elbow: Secondary | ICD-10-CM | POA: Insufficient documentation

## 2017-10-28 NOTE — Progress Notes (Signed)
Referral was sent on 10/14/2017.

## 2017-10-28 NOTE — Telephone Encounter (Signed)
Pt is scheduled on 11/17/2017 to see the GI

## 2017-11-01 ENCOUNTER — Other Ambulatory Visit: Payer: Self-pay | Admitting: Internal Medicine

## 2017-11-01 DIAGNOSIS — R2232 Localized swelling, mass and lump, left upper limb: Secondary | ICD-10-CM

## 2017-11-02 ENCOUNTER — Encounter: Payer: Self-pay | Admitting: Internal Medicine

## 2017-11-03 ENCOUNTER — Encounter: Payer: Self-pay | Admitting: Internal Medicine

## 2017-11-09 DIAGNOSIS — G43719 Chronic migraine without aura, intractable, without status migrainosus: Secondary | ICD-10-CM | POA: Diagnosis not present

## 2017-11-09 DIAGNOSIS — M25561 Pain in right knee: Secondary | ICD-10-CM | POA: Diagnosis not present

## 2017-11-13 ENCOUNTER — Encounter: Payer: Self-pay | Admitting: Internal Medicine

## 2017-11-13 ENCOUNTER — Ambulatory Visit: Payer: BLUE CROSS/BLUE SHIELD | Admitting: Internal Medicine

## 2017-11-13 VITALS — BP 96/58 | HR 82 | Temp 98.0°F | Ht 67.0 in | Wt 187.6 lb

## 2017-11-13 DIAGNOSIS — I959 Hypotension, unspecified: Secondary | ICD-10-CM

## 2017-11-13 DIAGNOSIS — J301 Allergic rhinitis due to pollen: Secondary | ICD-10-CM | POA: Diagnosis not present

## 2017-11-13 DIAGNOSIS — R1013 Epigastric pain: Secondary | ICD-10-CM

## 2017-11-13 DIAGNOSIS — K2950 Unspecified chronic gastritis without bleeding: Secondary | ICD-10-CM | POA: Diagnosis not present

## 2017-11-13 DIAGNOSIS — M25561 Pain in right knee: Secondary | ICD-10-CM | POA: Diagnosis not present

## 2017-11-13 DIAGNOSIS — E559 Vitamin D deficiency, unspecified: Secondary | ICD-10-CM | POA: Diagnosis not present

## 2017-11-13 DIAGNOSIS — M7989 Other specified soft tissue disorders: Secondary | ICD-10-CM

## 2017-11-13 DIAGNOSIS — Z1231 Encounter for screening mammogram for malignant neoplasm of breast: Secondary | ICD-10-CM

## 2017-11-13 DIAGNOSIS — M25562 Pain in left knee: Secondary | ICD-10-CM

## 2017-11-13 DIAGNOSIS — M799 Soft tissue disorder, unspecified: Secondary | ICD-10-CM

## 2017-11-13 DIAGNOSIS — G43009 Migraine without aura, not intractable, without status migrainosus: Secondary | ICD-10-CM

## 2017-11-13 DIAGNOSIS — G8929 Other chronic pain: Secondary | ICD-10-CM

## 2017-11-13 HISTORY — DX: Hypotension, unspecified: I95.9

## 2017-11-13 HISTORY — DX: Epigastric pain: R10.13

## 2017-11-13 NOTE — Progress Notes (Signed)
Pre visit review using our clinic review tool, if applicable. No additional management support is needed unless otherwise documented below in the visit note. 

## 2017-11-13 NOTE — Patient Instructions (Signed)
F/u in 3 months sooner if needed   Hypotension Hypotension, commonly called low blood pressure, is when the force of blood pumping through your arteries is too weak. Arteries are blood vessels that carry blood from the heart throughout the body. When blood pressure is too low, you may not get enough blood to your brain or to the rest of your organs. This can cause weakness, light-headedness, rapid heartbeat, and fainting. Depending on the cause and severity, hypotension may be harmless (benign) or cause serious problems (critical). What are the causes? Possible causes of hypotension include:  Blood loss.  Loss of body fluids (dehydration).  Heart problems.  Hormone (endocrine) problems.  Pregnancy.  Severe infection.  Lack of certain nutrients.  Severe allergic reactions (anaphylaxis).  Certain medicines, such as blood pressure medicine or medicines that make the body lose excess fluids (diuretics). Sometimes, hypotension can be caused by not taking medicine as directed, such as taking too much of a certain medicine.  What increases the risk? Certain factors can make you more likely to develop hypotension, including:  Age. Risk increases as you get older.  Conditions that affect the heart or the central nervous system.  Taking certain medicines, such as blood pressure medicine or diuretics.  Being pregnant.  What are the signs or symptoms? Symptoms of this condition may include:  Weakness.  Light-headedness.  Dizziness.  Blurred vision.  Fatigue.  Rapid heartbeat.  Fainting, in severe cases.  How is this diagnosed? This condition is diagnosed based on:  Your medical history.  Your symptoms.  Your blood pressure measurement. Your health care provider will check your blood pressure when you are: ? Lying down. ? Sitting. ? Standing.  A blood pressure reading is recorded as two numbers, such as "120 over 80" (or 120/80). The first ("top") number is called  the systolic pressure. It is a measure of the pressure in your arteries as your heart beats. The second ("bottom") number is called the diastolic pressure. It is a measure of the pressure in your arteries when your heart relaxes between beats. Blood pressure is measured in a unit called mm Hg. Healthy blood pressure for adults is 120/80. If your blood pressure is below 90/60, you may be diagnosed with hypotension. Other information or tests that may be used to diagnose hypotension include:  Your other vital signs, such as your heart rate and temperature.  Blood tests.  Tilt table test. For this test, you will be safely secured to a table that moves you from a lying position to an upright position. Your heart rhythm and blood pressure will be monitored during the test.  How is this treated? Treatment for this condition may include:  Changing your diet. This may involve eating more salt (sodium) or drinking more water.  Taking medicines to raise your blood pressure.  Changing the dosage of certain medicines you are taking that might be lowering your blood pressure.  Wearing compression stockings. These stockings help to prevent blood clots and reduce swelling in your legs.  In some cases, you may need to go to the hospital for:  Fluid replacement. This means you will receive fluids through an IV tube.  Blood replacement. This means you will receive donated blood through an IV tube (transfusion).  Treating an infection or heart problems, if this applies.  Monitoring. You may need to be monitored while medicines that you are taking wear off.  Follow these instructions at home: Eating and drinking   Drink enough fluid  to keep your urine clear or pale yellow.  Eat a healthy diet and follow instructions from your health care provider about eating or drinking restrictions. A healthy diet includes: ? Fresh fruits and vegetables. ? Whole grains. ? Lean meats. ? Low-fat dairy  products.  Eat extra salt only as directed. Do not add extra salt to your diet unless your health care provider told you to do that.  Eat frequent, small meals.  Avoid standing up suddenly after eating. Medicines  Take over-the-counter and prescription medicines only as told by your health care provider. ? Follow instructions from your health care provider about changing the dosage of your current medicines, if this applies. ? Do not stop or adjust any of your medicines on your own. General instructions  Wear compression stockings as told by your health care provider.  Get up slowly from lying down or sitting positions. This gives your blood pressure a chance to adjust.  Avoid hot showers and excessive heat as directed by your health care provider.  Return to your normal activities as told by your health care provider. Ask your health care provider what activities are safe for you.  Do not use any products that contain nicotine or tobacco, such as cigarettes and e-cigarettes. If you need help quitting, ask your health care provider.  Keep all follow-up visits as told by your health care provider. This is important. Contact a health care provider if:  You vomit.  You have diarrhea.  You have a fever for more than 2-3 days.  You feel more thirsty than usual.  You feel weak and tired. Get help right away if:  You have chest pain.  You have a fast or irregular heartbeat.  You develop numbness in any part of your body.  You cannot move your arms or your legs.  You have trouble speaking.  You become sweaty or feel light-headed.  You faint.  You feel short of breath.  You have trouble staying awake.  You feel confused. This information is not intended to replace advice given to you by your health care provider. Make sure you discuss any questions you have with your health care provider. Document Released: 08/04/2005 Document Revised: 02/22/2016 Document Reviewed:  01/24/2016 Elsevier Interactive Patient Education  2018 [employer].

## 2017-11-16 ENCOUNTER — Encounter: Payer: Self-pay | Admitting: Internal Medicine

## 2017-11-16 MED ORDER — CHOLECALCIFEROL 1.25 MG (50000 UT) PO CAPS: 50000.0000 [IU] | ORAL_CAPSULE | ORAL | 1 refills | Status: DC

## 2017-11-16 NOTE — Progress Notes (Addendum)
Chief Complaint  Patient presents with  . Follow-up   F/u  1. Migraines better since on magnesium 400 mg qd and Imitrex 100 mg taking 1x per week saw Neurology as well  2. Still having epigastric abdominal pain and has GI appt Tuesday 3. Left elbow lesion has appt with Dr. Roland Rack 11/23/17 see MRI  4. She c/o right knee pain and had steroid injection with [facility] in the past  5. Low blood pressure and ? H/o murmur per pt she wants to know etiology she has seen Dr. Ubaldo Glassing in the past need to get copy of echo report. She reports she had HTN prior to gastric bypass    Review of Systems  Constitutional: Negative for weight loss.  HENT: Negative for hearing loss.   Eyes: Negative for blurred vision.  Respiratory: Negative for shortness of breath.   Cardiovascular: Negative for chest pain.  Gastrointestinal: Positive for abdominal pain.  Musculoskeletal:       +left elbow lesion   Skin: Negative for rash.  Neurological: Negative for dizziness and headaches.  Psychiatric/Behavioral: Negative for depression.   Past Medical History:  Diagnosis Date  . Acid reflux   . Allergic rhinitis   . Allergy   . Anxiety   . Heart murmur   . History of hiatal hernia   . Kidney stones   . Migraine   . Sleep apnea    Past Surgical History:  Procedure Laterality Date  . ABDOMINAL HYSTERECTOMY     2008  . BREAST BIOPSY Right 12/19/2013   stereo right- neg  . CHOLECYSTECTOMY    . COLONOSCOPY WITH PROPOFOL N/A 05/05/2016   Procedure: COLONOSCOPY WITH PROPOFOL;  Surgeon: Lollie Sails, MD;  Location: [facility] ENDOSCOPY;  Service: Endoscopy;  Laterality: N/A;  . ESOPHAGOGASTRODUODENOSCOPY (EGD) WITH PROPOFOL N/A 05/05/2016   Procedure: ESOPHAGOGASTRODUODENOSCOPY (EGD) WITH PROPOFOL;  Surgeon: Lollie Sails, MD;  Location: [facility] ENDOSCOPY;  Service: Endoscopy;  Laterality: N/A;  . EXTRACORPOREAL SHOCK WAVE LITHOTRIPSY Left 11/06/2016   Procedure: EXTRACORPOREAL SHOCK WAVE LITHOTRIPSY (ESWL);  Surgeon:  Nickie Retort, MD;  Location: [facility] ORS;  Service: Urology;  Laterality: Left;  . gallbladder  2012  . GASTRIC BYPASS  2012  . REDUCTION MAMMAPLASTY Bilateral    2010  . REDUCTION MAMMAPLASTY     Family History  Problem Relation Age of Onset  . Prostate cancer Father   . Hyperlipidemia Father   . Hypertension Father   . Depression Father   . Kidney Stones Maternal Grandfather   . Stroke Maternal Grandfather   . Cancer Maternal Grandfather        prostate cancer   . Diabetes Mother   . Hyperlipidemia Mother   . Hypertension Mother   . Heart disease Maternal Grandmother   . Cancer Other        breast cancer   . Kidney disease Neg Hx   . Bladder Cancer Neg Hx   . Breast cancer Neg Hx    Social History   Socioeconomic History  . Marital status: Married    Spouse name: Not on file  . Number of children: Not on file  . Years of education: Not on file  . Highest education level: Not on file  Occupational History  . Not on file  Social Needs  . Financial resource strain: Not on file  . Food insecurity:    Worry: Not on file    Inability: Not on file  . Transportation needs:    Medical:  Not on file    Non-medical: Not on file  Tobacco Use  . Smoking status: Never Smoker  . Smokeless tobacco: Never Used  Substance and Sexual Activity  . Alcohol use: No    Alcohol/week: 0.0 oz  . Drug use: No  . Sexual activity: Yes    Comment: female partner   Lifestyle  . Physical activity:    Days per week: Not on file    Minutes per session: Not on file  . Stress: Not on file  Relationships  . Social connections:    Talks on phone: Not on file    Gets together: Not on file    Attends religious service: Not on file    Active member of club or organization: Not on file    Attends meetings of clubs or organizations: Not on file    Relationship status: Not on file  . Intimate partner violence:    Fear of current or ex partner: Not on file    Emotionally abused: Not on  file    Physically abused: Not on file    Forced sexual activity: Not on file  Other Topics Concern  . 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 albuterol (PROAIR HFA) 108 (90 Base) MCG/ACT inhaler ProAir HFA 90 mcg/actuation aerosol inhaler  INHALE 2 PUFFS PO PRF WHEEZING OR SHORTNESS OF BREATH  . ALLERGIST TRAY 1CC 27GX3/8" 27G X 3/8" 1 ML KIT   . cetirizine (ZYRTEC) 10 MG tablet Take 10 mg by mouth daily.  . Cyanocobalamin (RA VITAMIN B-12 TR) 1000 MCG TBCR Take by mouth.  . ferrous sulfate 325 (65 FE) MG EC tablet Take 325 mg by mouth 3 (three) times daily with meals.  . fluconazole (DIFLUCAN) 150 MG tablet   . fluticasone (FLONASE) 50 MCG/ACT nasal spray Place 1 spray into both nostrils daily.  . Influenza vac split quadrivalent PF (FLUARIX QUADRIVALENT) 0.5 ML injection Fluarix Quad 2018-2019 (PF) 60 mcg (15 mcg x 4)/0.5 mL IM syringe  ADM 0.5ML IM UTD  . Magnesium 400 MG CAPS Take by mouth.  . Multiple Vitamin (MULTIVITAMIN) tablet Take 1 tablet by mouth daily.  . pantoprazole (PROTONIX) 20 MG tablet Take 1 tablet (20 mg total) by mouth daily. Before food 30 minutes  . promethazine (PHENERGAN) 25 MG tablet TK 1 T PO Q 6 H PRN N OR MILD DISCOMFORT  . SUMAtriptan (IMITREX) 100 MG tablet TK 1 T PO ONCE PRF MIGRAINE. MAY TAKE A SECOND DOSE AFTER 2 H IF NEEDED  . [DISCONTINUED] Cholecalciferol ([employer] VITAMIN D PO) Take by mouth.  . [DISCONTINUED] HYDROMET 5-1.5 MG/5ML syrup TK 5 ML PO Q 6 H PRN COU   No Known Allergies Recent Results (from the past 2160 hour(s))  Comprehensive metabolic panel     Status: None   Collection Time: 10/09/17  2:30 PM  Result Value Ref Range   Sodium 138 135 - 145 mEq/L   Potassium 4.2 3.5 - 5.1 mEq/L   Chloride 103 96 - 112 mEq/L   CO2 27 19 - 32  mEq/L   Glucose, Bld 85 70 - 99 mg/dL   BUN 15 6 - 23 mg/dL   Creatinine, Ser  0.80 0.40 - 1.20 mg/dL   Total Bilirubin 0.3 0.2 - 1.2 mg/dL   Alkaline Phosphatase 75 39 - 117 U/L   AST 18 0 - 37 U/L   ALT 12 0 - 35 U/L   Total Protein 7.8 6.0 - 8.3 g/dL   Albumin 4.0 3.5 - 5.2 g/dL   Calcium 9.1 8.4 - 10.5 mg/dL   GFR 99.29 >60.00 mL/min  CBC with Differential/Platelet     Status: None   Collection Time: 10/09/17  2:30 PM  Result Value Ref Range   WBC 8.3 4.0 - 10.5 K/uL   RBC 4.08 3.87 - 5.11 Mil/uL   Hemoglobin 12.8 12.0 - 15.0 g/dL   HCT 38.7 36.0 - 46.0 %   MCV 94.8 78.0 - 100.0 fl   MCHC 33.0 30.0 - 36.0 g/dL   RDW 14.4 11.5 - 15.5 %   Platelets 340.0 150.0 - 400.0 K/uL   Neutrophils Relative % 71.3 43.0 - 77.0 %   Lymphocytes Relative 19.8 12.0 - 46.0 %   Monocytes Relative 7.8 3.0 - 12.0 %   Eosinophils Relative 0.9 0.0 - 5.0 %   Basophils Relative 0.2 0.0 - 3.0 %   Neutro Abs 5.9 1.4 - 7.7 K/uL   Lymphs Abs 1.6 0.7 - 4.0 K/uL   Monocytes Absolute 0.6 0.1 - 1.0 K/uL   Eosinophils Absolute 0.1 0.0 - 0.7 K/uL   Basophils Absolute 0.0 0.0 - 0.1 K/uL  Iron, TIBC and Ferritin Panel     Status: None   Collection Time: 10/09/17  2:30 PM  Result Value Ref Range   Iron 63 40 - 190 mcg/dL   TIBC 409 250 - 450 mcg/dL (calc)   %SAT 15 11 - 50 % (calc)   Ferritin 18 10 - 232 ng/mL  Hemoglobin A1c     Status: None   Collection Time: 10/09/17  2:30 PM  Result Value Ref Range   Hgb A1c MFr Bld 6.0 4.6 - 6.5 %    Comment: Glycemic Control Guidelines for People with Diabetes:Non Diabetic:  <6%Goal of Therapy: <7%Additional Action Suggested:  >8%   Vitamin D (25 hydroxy)     Status: Abnormal   Collection Time: 10/09/17  2:30 PM  Result Value Ref Range   VITD 21.35 (L) 30.00 - 100.00 ng/mL  Urinalysis, Routine w reflex microscopic     Status: Abnormal   Collection Time: 10/09/17  2:30 PM  Result Value Ref Range   Color, Urine YELLOW Yellow;Lt. Yellow   APPearance CLEAR Clear    Specific Gravity, Urine >=1.030 (A) 1.000 - 1.030   pH 5.5 5.0 - 8.0   Total Protein, Urine NEGATIVE Negative   Urine Glucose NEGATIVE Negative   Ketones, ur NEGATIVE Negative   Bilirubin Urine NEGATIVE Negative   Hgb urine dipstick TRACE-INTACT (A) Negative   Urobilinogen, UA 0.2 0.0 - 1.0   Leukocytes, UA NEGATIVE Negative   Nitrite NEGATIVE Negative   WBC, UA 0-2/hpf 0-2/hpf   RBC / HPF 3-6/hpf (A) 0-2/hpf   Squamous Epithelial / LPF Rare(0-4/hpf) Rare(0-4/hpf)   Bacteria, UA Rare(<10/hpf) (A) None  TSH     Status: None   Collection Time: 10/09/17  2:30 PM  Result Value Ref Range   TSH 0.72 0.35 - 4.50 uIU/mL  T4, free     Status: None   Collection Time: 10/09/17  2:30 PM  Result Value Ref Range   Free T4 0.72 0.60 - 1.60 ng/dL    Comment: Specimens from patients who are  undergoing biotin therapy and /or ingesting biotin supplements may contain high levels of biotin.  The higher biotin concentration in these specimens interferes with this Free T4 assay.  Specimens that contain high levels  of biotin may cause false high results for this Free T4 assay.  Please interpret results in light of the total clinical presentation of the patient.    Hepatitis B surface antibody     Status: None   Collection Time: 10/09/17  2:30 PM  Result Value Ref Range   Hepatitis B-Post 26 > OR = 10 mIU/mL    Comment: . Patient has immunity to hepatitis B virus. . For additional information, please refer to http://education.[facility].com/faq/FAQ105 (This link is being provided for informational/ educational purposes only).   B12     Status: None   Collection Time: 10/09/17  2:30 PM  Result Value Ref Range   Vitamin B-12 303 211 - 911 pg/mL  H. pylori antigen, stool     Status: None   Collection Time: 10/13/17  2:38 PM  Result Value Ref Range   H pylori Ag, Stl Negative Negative   Objective  Body mass index is 29.38 kg/m. Wt Readings from Last 3 Encounters:  11/13/17 187 lb 9.6 oz  (85.1 kg)  10/09/17 179 lb 9.6 oz (81.5 kg)  02/02/17 187 lb 1.6 oz (84.9 kg)   Temp Readings from Last 3 Encounters:  11/13/17 98 F (36.7 C) (Oral)  10/09/17 98 F (36.7 C) (Oral)  01/05/17 98.1 F (36.7 C) (Oral)   BP Readings from Last 3 Encounters:  11/13/17 (!) 96/58  10/09/17 104/60  02/02/17 105/71   Pulse Readings from Last 3 Encounters:  11/13/17 82  10/09/17 98  02/02/17 67    Physical Exam  Constitutional: She is oriented to person, place, and time and well-developed, well-nourished, and in no distress. Vital signs are normal.  HENT:  Head: Normocephalic and atraumatic.  Mouth/Throat: Oropharynx is clear and moist and mucous membranes are normal.  Eyes: Pupils are equal, round, and reactive to light. Conjunctivae are normal.  Cardiovascular: Normal rate, regular rhythm and normal heart sounds.  No murmur heard on exam today   Pulmonary/Chest: Effort normal and breath sounds normal.  Neurological: She is alert and oriented to person, place, and time. Gait normal. Gait normal.  Skin: Skin is warm and dry.     Psychiatric: Mood, memory, affect and judgment normal.  Nursing note and vitals reviewed.   Assessment   1. Migraines improved  2. Epigastric abdominal pain  3.left elbow lesion  4. Chronic right kne epain  5. Hypotension, ?cardiac murmur noted per pt in the past not heart on exam today  6. HM Plan  1. Prn imitrex  Magnesium 400 mg qd  2. appt GI 11/17/17  3. appt [facility] 11/23/17 Dr. Roland Rack  4. Advised pt to disc knee with ortho as well has seen walk in clinic at [facility] In the past and had Xrays  5.  Increase fluid intake with water  No known etiology  Get copy of echo from Dr. Ubaldo Glassing had years ago  Consider repeat echo in future  6.  Had flu shot 07/04/17 Tdap had 01/24/16  Hep B immune  Need to check lipid in future  Consider check HIV in future   S/p hysterectomy will ask if had abnormal pap I nthe past at f/u mammo due 03/13/18  ordered sent  Colonoscopy had 04/2016 [facility] repeat rec in 5 years [facility] pending referral  appt   Reviewed cardiology notes no echo obtained  Provider: Dr. Olivia Mackie McLean-Scocuzza-[facility]

## 2017-11-17 DIAGNOSIS — R1013 Epigastric pain: Secondary | ICD-10-CM | POA: Diagnosis not present

## 2017-11-17 DIAGNOSIS — Z8371 Family history of colonic polyps: Secondary | ICD-10-CM | POA: Diagnosis not present

## 2017-11-23 DIAGNOSIS — D2112 Benign neoplasm of connective and other soft tissue of left upper limb, including shoulder: Secondary | ICD-10-CM | POA: Diagnosis not present

## 2017-11-23 DIAGNOSIS — J301 Allergic rhinitis due to pollen: Secondary | ICD-10-CM | POA: Diagnosis not present

## 2017-11-23 HISTORY — DX: Benign neoplasm of connective and other soft tissue of left upper limb, including shoulder: D21.12

## 2018-01-01 DIAGNOSIS — K219 Gastro-esophageal reflux disease without esophagitis: Secondary | ICD-10-CM | POA: Diagnosis not present

## 2018-01-01 DIAGNOSIS — R1013 Epigastric pain: Secondary | ICD-10-CM | POA: Diagnosis not present

## 2018-01-15 DIAGNOSIS — G43719 Chronic migraine without aura, intractable, without status migrainosus: Secondary | ICD-10-CM | POA: Diagnosis not present

## 2018-01-16 ENCOUNTER — Emergency Department
Admission: EM | Admit: 2018-01-16 | Discharge: 2018-01-16 | Disposition: A | Discharge status: Home / Self Care | Payer: BLUE CROSS/BLUE SHIELD | Attending: Emergency Medicine | Admitting: Emergency Medicine

## 2018-01-16 ENCOUNTER — Other Ambulatory Visit: Payer: Self-pay

## 2018-01-16 ENCOUNTER — Encounter: Payer: Self-pay | Admitting: Emergency Medicine

## 2018-01-16 DIAGNOSIS — Y999 Unspecified external cause status: Secondary | ICD-10-CM | POA: Insufficient documentation

## 2018-01-16 DIAGNOSIS — Z23 Encounter for immunization: Secondary | ICD-10-CM | POA: Insufficient documentation

## 2018-01-16 DIAGNOSIS — Y939 Activity, unspecified: Secondary | ICD-10-CM | POA: Diagnosis not present

## 2018-01-16 DIAGNOSIS — W25XXXA Contact with sharp glass, initial encounter: Secondary | ICD-10-CM | POA: Insufficient documentation

## 2018-01-16 DIAGNOSIS — S61211A Laceration without foreign body of left index finger without damage to nail, initial encounter: Secondary | ICD-10-CM | POA: Insufficient documentation

## 2018-01-16 DIAGNOSIS — S61213A Laceration without foreign body of left middle finger without damage to nail, initial encounter: Secondary | ICD-10-CM

## 2018-01-16 DIAGNOSIS — Y929 Unspecified place or not applicable: Secondary | ICD-10-CM | POA: Insufficient documentation

## 2018-01-16 MED ORDER — CEPHALEXIN 500 MG PO CAPS: 500.0000 mg | ORAL_CAPSULE | Freq: Four times a day (QID) | ORAL | 0 refills | Status: AC

## 2018-01-16 MED ORDER — TETANUS-DIPHTH-ACELL PERTUSSIS 5-2.5-18.5 LF-MCG/0.5 IM SUSP
0.5000 mL | Freq: Once | INTRAMUSCULAR | Status: AC
  Administered 2018-01-16: 0.5 mL via INTRAMUSCULAR
  Filled 2018-01-16: qty 0.5

## 2018-01-16 NOTE — ED Triage Notes (Signed)
Patient with laceration to right first finger from glass jar.

## 2018-01-16 NOTE — ED Notes (Signed)
Pt states she broke a glass jar and cut her finger while trying to clean it up

## 2018-01-16 NOTE — ED Provider Notes (Signed)
[facility] Emergency Department Provider Note  ____________________________________________  Time seen: Approximately 9:13 PM  I have reviewed the triage vital signs and the nursing notes.   HISTORY  Chief Complaint Laceration    HPI Valerie Colon is a 47 y.o. female that presents to the emergency department for evaluation of index finger injury.  Patient cut her finger on broken glass. It was a jar that she stores coins in. She has full range of motion of finger. No additional injuries.  She is unsure of last tetanus shot.   Past Medical History:  Diagnosis Date  . Acid reflux   . Allergic rhinitis   . Allergy   . Anxiety   . Heart murmur   . History of hiatal hernia   . Kidney stones   . Migraine   . Sleep apnea     Patient Active Problem List   Diagnosis Date Noted  . Epigastric abdominal pain 11/13/2017  . Hypotension 11/13/2017  . Chronic gastritis 10/09/2017  . Migraine headache without aura 10/09/2017  . Soft tissue lesion of elbow region 10/09/2017  . Knee pain, bilateral 10/09/2017  . Shoulder pain, right 10/09/2017  . Pelvic pain 10/09/2017  . Breast calcifications 10/09/2017  . Nephrolithiasis 02/29/2016  . Chronic fatigue 01/24/2016  . Dizziness 01/24/2016  . Gastroesophageal reflux disease without esophagitis 01/24/2016  . GAD (generalized anxiety disorder) 11/20/2015  . Emotional stress 11/09/2015  . Intermittent palpitations 11/09/2015  . Vitamin D deficiency 07/09/2015  . Allergic rhinitis 03/29/2015  . Chronic anemia 01/08/2015  . Prediabetes 01/08/2015  . Obstructive apnea 01/20/2014  . Hypoglycemia after GI (gastrointestinal) surgery 11/26/2012    Past Surgical History:  Procedure Laterality Date  . ABDOMINAL HYSTERECTOMY     2008  . BREAST BIOPSY Right 12/19/2013   stereo right- neg  . CHOLECYSTECTOMY    . COLONOSCOPY WITH PROPOFOL N/A 05/05/2016   Procedure: COLONOSCOPY WITH PROPOFOL;  Surgeon: Lollie Sails, MD;  Location: [facility] ENDOSCOPY;  Service: Endoscopy;  Laterality: N/A;  . ESOPHAGOGASTRODUODENOSCOPY (EGD) WITH PROPOFOL N/A 05/05/2016   Procedure: ESOPHAGOGASTRODUODENOSCOPY (EGD) WITH PROPOFOL;  Surgeon: Lollie Sails, MD;  Location: [facility] ENDOSCOPY;  Service: Endoscopy;  Laterality: N/A;  . EXTRACORPOREAL SHOCK WAVE LITHOTRIPSY Left 11/06/2016   Procedure: EXTRACORPOREAL SHOCK WAVE LITHOTRIPSY (ESWL);  Surgeon: Nickie Retort, MD;  Location: [facility] ORS;  Service: Urology;  Laterality: Left;  . gallbladder  2012  . GASTRIC BYPASS  2012  . REDUCTION MAMMAPLASTY Bilateral    2010  . REDUCTION MAMMAPLASTY      Prior to Admission medications   Medication Sig Start Date End Date Taking? Authorizing Provider  cetirizine (ZYRTEC) 10 MG tablet Take 10 mg by mouth daily.   Yes [provider]  Cholecalciferol 50000 units capsule Take 1 capsule (50,000 Units total) by mouth once a week. 11/16/17  Yes McLean-Scocuzza, Nino Glow, MD  Cyanocobalamin (RA VITAMIN B-12 TR) 1000 MCG TBCR Take by mouth.   Yes [provider]  ferrous sulfate 325 (65 FE) MG EC tablet Take 325 mg by mouth 3 (three) times daily with meals.   Yes [provider]  fluticasone (FLONASE) 50 MCG/ACT nasal spray Place 1 spray into both nostrils daily.   Yes [provider]  Magnesium 400 MG CAPS Take by mouth.   Yes [provider]  Multiple Vitamin (MULTIVITAMIN) tablet Take 1 tablet by mouth daily.   Yes [provider]  pantoprazole (PROTONIX) 20 MG tablet Take 1 tablet (  20 mg total) by mouth daily. Before food 30 minutes 10/09/17  Yes McLean-Scocuzza, Nino Glow, MD  SUMAtriptan (IMITREX) 100 MG tablet TK 1 T PO ONCE PRF MIGRAINE. MAY TAKE A SECOND DOSE AFTER 2 H IF NEEDED 11/09/17  Yes [provider]  cephALEXin (KEFLEX) 500 MG capsule Take 1 capsule (500 mg total) by mouth 4 (four) times daily for 10 days. 01/16/18 01/26/18  Laban Emperor, PA-C     Allergies Patient has no known allergies.  Family History  Problem Relation Age of Onset  . Prostate cancer Father   . Hyperlipidemia Father   . Hypertension Father   . Depression Father   . Kidney Stones Maternal Grandfather   . Stroke Maternal Grandfather   . Cancer Maternal Grandfather        prostate cancer   . Diabetes Mother   . Hyperlipidemia Mother   . Hypertension Mother   . Heart disease Maternal Grandmother   . Cancer Other        breast cancer   . Kidney disease Neg Hx   . Bladder Cancer Neg Hx   . Breast cancer Neg Hx     Social History Social History   Tobacco Use  . Smoking status: Never Smoker  . Smokeless tobacco: Never Used  Substance Use Topics  . Alcohol use: No    Alcohol/week: 0.0 oz  . Drug use: No     Review of Systems  Constitutional: No fever/chills Gastrointestinal: No abdominal pain.  No nausea, no vomiting.  Musculoskeletal: Positive for finger pain.  Skin: Negative for rash, ecchymosis. Positive for laceration.  Neurological: Negative for numbness or tingling   ____________________________________________   PHYSICAL EXAM:  VITAL SIGNS: ED Triage Vitals  Enc Vitals Group     BP 01/16/18 2009 134/72     Pulse Rate 01/16/18 2009 63     Resp 01/16/18 2009 16     Temp 01/16/18 2009 97.9 F (36.6 C)     Temp Source 01/16/18 2009 Oral     SpO2 01/16/18 2009 99 %     Weight 01/16/18 2010 180 lb (81.6 kg)     Height 01/16/18 2010 5\' 6"  (1.676 m)     Head Circumference --      Peak Flow --      Pain Score 01/16/18 2009 4     Pain Loc --      Pain Edu? --      Excl. in [place]? --      Constitutional: Alert and oriented. Well appearing and in no acute distress. Eyes: Conjunctivae are normal. PERRL. EOMI. Head: Atraumatic. ENT:      Ears:      Nose: No congestion/rhinnorhea.      Mouth/Throat: Mucous membranes are moist.  Neck: No stridor. Cardiovascular: Normal rate, regular rhythm.  Good peripheral  circulation. Respiratory: Normal respiratory effort without tachypnea or retractions. Lungs CTAB. Good air entry to the bases with no decreased or absent breath sounds. Musculoskeletal: Full range of motion to all extremities. No gross deformities appreciated. Neurologic:  Normal speech and language. No gross focal neurologic deficits are appreciated.  Skin:  Skin is warm, dry. 1/4cm curved laceration to dorsal index finger Psychiatric: Mood and affect are normal. Speech and behavior are normal. Patient exhibits appropriate insight and judgement.   ____________________________________________   LABS (all labs ordered are listed, but only abnormal results are displayed)  Labs Reviewed - No data to display ____________________________________________  EKG   ____________________________________________  RADIOLOGY  No results found.  ____________________________________________    PROCEDURES  Procedure(s) performed:    Procedures  LACERATION REPAIR Performed by: Laban Emperor  Consent: Verbal consent obtained.  Consent given by: patient  Prepped and Draped in normal sterile fashion  Laceration Location: finger  Laceration Length: 1/4 cm  Anesthesia: None  Irrigation method: syringe  Amount of cleaning: 562ml normal saline  Skin closure: steri strips  Patient tolerance: Patient tolerated the procedure well with no immediate complications.  Medications  Tdap (BOOSTRIX) injection 0.5 mL (0.5 mLs Intramuscular Given 01/16/18 2132)     ____________________________________________   INITIAL IMPRESSION / ASSESSMENT AND PLAN / ED COURSE  Pertinent labs & imaging results that were available during my care of the patient were reviewed by me and considered in my medical decision making (see chart for details).  Review of the [place] CSRS was performed in accordance of the [place] prior to dispensing any controlled drugs.   Patient's diagnosis is consistent with  finger laceration. Vital signs and exam are reassuring. Laceration was repaired with steri strips. Tetanus shot was applied.   Patient will be discharged home with prescriptions for keflex. Patient is to follow up with PCP as directed. Patient is given ED precautions to return to the ED for any worsening or new symptoms.     ____________________________________________  FINAL CLINICAL IMPRESSION(S) / ED DIAGNOSES  Final diagnoses:  Laceration of left middle finger without foreign body without damage to nail, initial encounter      NEW MEDICATIONS STARTED DURING THIS VISIT:  ED Discharge Orders        Ordered    cephALEXin (KEFLEX) 500 MG capsule  4 times daily     01/16/18 2152          This chart was dictated using voice recognition software/Dragon. Despite best efforts to proofread, errors can occur which can change the meaning. Any change was purely unintentional.    Laban Emperor, PA-C 01/16/18 2253    Harvest Dark, MD 01/16/18 2306

## 2018-01-16 NOTE — ED Triage Notes (Addendum)
Pt has laceration to right second digit that she cut on broken glass jar-horse-shoe shaped laceration to middle knuckle of finger; bleeding controlled; pt was at home with incident occurred; denies any other injuries

## 2018-01-17 DIAGNOSIS — M19011 Primary osteoarthritis, right shoulder: Secondary | ICD-10-CM | POA: Diagnosis not present

## 2018-01-17 DIAGNOSIS — M7989 Other specified soft tissue disorders: Secondary | ICD-10-CM | POA: Diagnosis not present

## 2018-01-17 DIAGNOSIS — M25511 Pain in right shoulder: Secondary | ICD-10-CM | POA: Diagnosis not present

## 2018-01-18 DIAGNOSIS — M7541 Impingement syndrome of right shoulder: Secondary | ICD-10-CM | POA: Diagnosis not present

## 2018-01-26 DIAGNOSIS — S61210A Laceration without foreign body of right index finger without damage to nail, initial encounter: Secondary | ICD-10-CM | POA: Diagnosis not present

## 2018-01-29 DIAGNOSIS — J301 Allergic rhinitis due to pollen: Secondary | ICD-10-CM | POA: Diagnosis not present

## 2018-02-12 ENCOUNTER — Encounter: Payer: Self-pay | Admitting: Internal Medicine

## 2018-02-12 ENCOUNTER — Ambulatory Visit: Payer: BLUE CROSS/BLUE SHIELD | Admitting: Internal Medicine

## 2018-02-12 VITALS — BP 102/68 | HR 76 | Temp 98.3°F | Ht 66.0 in | Wt 177.8 lb

## 2018-02-12 DIAGNOSIS — K912 Postsurgical malabsorption, not elsewhere classified: Secondary | ICD-10-CM | POA: Diagnosis not present

## 2018-02-12 DIAGNOSIS — E162 Hypoglycemia, unspecified: Secondary | ICD-10-CM

## 2018-02-12 DIAGNOSIS — K2950 Unspecified chronic gastritis without bleeding: Secondary | ICD-10-CM | POA: Diagnosis not present

## 2018-02-12 DIAGNOSIS — G43009 Migraine without aura, not intractable, without status migrainosus: Secondary | ICD-10-CM

## 2018-02-12 DIAGNOSIS — R7303 Prediabetes: Secondary | ICD-10-CM | POA: Diagnosis not present

## 2018-02-12 DIAGNOSIS — Z8639 Personal history of other endocrine, nutritional and metabolic disease: Secondary | ICD-10-CM | POA: Diagnosis not present

## 2018-02-12 DIAGNOSIS — E161 Other hypoglycemia: Secondary | ICD-10-CM

## 2018-02-12 DIAGNOSIS — M25561 Pain in right knee: Secondary | ICD-10-CM

## 2018-02-12 DIAGNOSIS — Z0184 Encounter for antibody response examination: Secondary | ICD-10-CM

## 2018-02-12 DIAGNOSIS — R1013 Epigastric pain: Secondary | ICD-10-CM | POA: Diagnosis not present

## 2018-02-12 DIAGNOSIS — G8929 Other chronic pain: Secondary | ICD-10-CM

## 2018-02-12 DIAGNOSIS — I959 Hypotension, unspecified: Secondary | ICD-10-CM

## 2018-02-12 DIAGNOSIS — R011 Cardiac murmur, unspecified: Secondary | ICD-10-CM

## 2018-02-12 DIAGNOSIS — Z1159 Encounter for screening for other viral diseases: Secondary | ICD-10-CM | POA: Diagnosis not present

## 2018-02-12 DIAGNOSIS — Z13220 Encounter for screening for lipoid disorders: Secondary | ICD-10-CM

## 2018-02-12 DIAGNOSIS — M25511 Pain in right shoulder: Secondary | ICD-10-CM

## 2018-02-12 DIAGNOSIS — M25562 Pain in left knee: Secondary | ICD-10-CM

## 2018-02-12 HISTORY — DX: Cardiac murmur, unspecified: R01.1

## 2018-02-12 HISTORY — DX: Other hypoglycemia: E16.1

## 2018-02-12 MED ORDER — LANCETS MISC: 1.0000 | Freq: Two times a day (BID) | 3 refills | Status: DC

## 2018-02-12 MED ORDER — BLOOD GLUCOSE MONITOR KIT: PACK | 0 refills | Status: DC

## 2018-02-12 MED ORDER — BLOOD GLUCOSE TEST VI STRP: 1.0000 | ORAL_STRIP | Freq: Two times a day (BID) | 3 refills | Status: DC

## 2018-02-12 NOTE — Progress Notes (Signed)
Pre visit review using our clinic review tool, if applicable. No additional management support is needed unless otherwise documented below in the visit note. 

## 2018-02-12 NOTE — Patient Instructions (Addendum)
We will order glucose meter please check 2x per day  Try to eat smaller meals every 3 hours, increase fiber intake and reduce sugar and regular exercise  sch fasting labs next week   Hypoglycemia Hypoglycemia occurs when the level of sugar (glucose) in the blood is too low. Glucose is a type of sugar that provides the body's main source of energy. Certain hormones (insulin and glucagon) control the level of glucose in the blood. Insulin lowers blood glucose, and glucagon increases blood glucose. Hypoglycemia can result from having too much insulin in the bloodstream, or from not eating enough food that contains glucose. Hypoglycemia can happen in people who do or do not have diabetes. It can develop quickly, and it can be a medical emergency. What are the causes? Hypoglycemia occurs most often in people who have diabetes. If you have diabetes, hypoglycemia may be caused by:  Diabetes medicine.  Not eating enough, or not eating often enough.  Increased physical activity.  Drinking alcohol, especially when you have not eaten recently.  If you do not have diabetes, hypoglycemia may be caused by:  A tumor in the pancreas. The pancreas is the organ that makes insulin.  Not eating enough, or not eating for long periods at a time (fasting).  Severe infection or illness that affects the liver, heart, or kidneys.  Certain medicines.  You may also have reactive hypoglycemia. This condition causes hypoglycemia within 4 hours of eating a meal. This may occur after having stomach surgery. Sometimes, the cause of reactive hypoglycemia is not known. What increases the risk? Hypoglycemia is more likely to develop in:  People who have diabetes and take medicines to lower blood glucose.  People who abuse alcohol.  People who have a severe illness.  What are the signs or symptoms? Hypoglycemia may not cause any symptoms. If you have symptoms, they may include:  Hunger.  Anxiety.  Sweating  and feeling clammy.  Confusion.  Dizziness or feeling light-headed.  Sleepiness.  Nausea.  Increased heart rate.  Headache.  Blurry vision.  Seizure.  Nightmares.  Tingling or numbness around the mouth, lips, or tongue.  A change in speech.  Decreased ability to concentrate.  A change in coordination.  Restless sleep.  Tremors or shakes.  Fainting.  Irritability.  How is this diagnosed? Hypoglycemia is diagnosed with a blood test to measure your blood glucose level. This blood test is done while you are having symptoms. Your health care provider may also do a physical exam and review your medical history. If you do not have diabetes, other tests may be done to find the cause of your hypoglycemia. How is this treated? This condition can often be treated by immediately eating or drinking something that contains glucose, such as:  3-4 sugar tablets (glucose pills).  Glucose gel, 15-gram tube.  Fruit juice, 4 oz (120 mL).  Regular soda (not diet soda), 4 oz (120 mL).  Low-fat milk, 4 oz (120 mL).  Several pieces of hard candy.  Sugar or honey, 1 Tbsp.  Treating Hypoglycemia If You Have Diabetes  If you are alert and able to swallow safely, follow the 15:15 rule:  Take 15 grams of a rapid-acting carbohydrate. Rapid-acting options include: ? 1 tube of glucose gel. ? 3 glucose pills. ? 6-8 pieces of hard candy. ? 4 oz (120 mL) of fruit juice. ? 4 oz (120 ml) of regular (not diet) soda.  Check your blood glucose 15 minutes after you take the carbohydrate.  If the repeat blood glucose level is still at or below 70 mg/dL (3.9 mmol/L), take 15 grams of a carbohydrate again.  If your blood glucose level does not increase above 70 mg/dL (3.9 mmol/L) after 3 tries, seek emergency medical care.  After your blood glucose level returns to normal, eat a meal or a snack within 1 hour.  Treating Severe Hypoglycemia Severe hypoglycemia is when your blood glucose  level is at or below 54 mg/dL (3 mmol/L). Severe hypoglycemia is an emergency. Do not wait to see if the symptoms will go away. Get medical help right away. Call your local emergency services (911 in the [place] Do not drive yourself to the hospital. If you have severe hypoglycemia and you cannot eat or drink, you may need an injection of glucagon. A family member or close friend should learn how to check your blood glucose and how to give you a glucagon injection. Ask your health care provider if you need to have an emergency glucagon injection kit available. Severe hypoglycemia may need to be treated in a hospital. The treatment may include getting glucose through an IV tube. You may also need treatment for the cause of your hypoglycemia. Follow these instructions at home: General instructions  Avoid any diets that cause you to not eat enough food. Talk with your health care provider before you start any new diet.  Take over-the-counter and prescription medicines only as told by your health care provider.  Limit alcohol intake to no more than 1 drink per day for nonpregnant women and 2 drinks per day for men. One drink equals 12 oz of beer, 5 oz of wine, or 1 oz of hard liquor.  Keep all follow-up visits as told by your health care provider. This is important. If You Have Diabetes:   Make sure you know the symptoms of hypoglycemia.  Always have a rapid-acting carbohydrate snack with you to treat low blood sugar.  Follow your diabetes management plan, as told by your health care provider. Make sure you: ? Take your medicines as directed. ? Follow your exercise plan. ? Follow your meal plan. Eat on time, and do not skip meals. ? Check your blood glucose as often as directed. Make sure to check your blood glucose before and after exercise. If you exercise longer or in a different way than usual, check your blood glucose more often. ? Follow your sick day plan whenever you cannot eat or drink  normally. Make this plan in advance with your health care provider.  Share your diabetes management plan with people in your workplace, school, and household.  Check your urine for ketones when you are ill and as told by your health care provider.  Carry a medical alert card or wear medical alert jewelry. If You Have Reactive Hypoglycemia or Low Blood Sugar From Other Causes:  Monitor your blood glucose as told by your health care provider.  Follow instructions from your health care provider about eating or drinking restrictions. Contact a health care provider if:  You have problems keeping your blood glucose in your target range.  You have frequent episodes of hypoglycemia. Get help right away if:  You continue to have hypoglycemia symptoms after eating or drinking something containing glucose.  Your blood glucose is at or below 54 mg/dL (3 mmol/L).  You have a seizure.  You faint. These symptoms may represent a serious problem that is an emergency. Do not wait to see if the symptoms will go away.  Get medical help right away. Call your local emergency services (911 in the [place] Do not drive yourself to the hospital. This information is not intended to replace advice given to you by your health care provider. Make sure you discuss any questions you have with your health care provider. Document Released: 08/04/2005 Document Revised: 01/16/2016 Document Reviewed: 09/07/2015 Elsevier Interactive Patient Education  [employer].

## 2018-02-12 NOTE — Progress Notes (Signed)
Chief Complaint  Patient presents with  . Follow-up   Follow up  1. S/p gastric bypass with Dr. Darnell Level and she c/w low blood sugars even after eating waking her up a night drenched in sweat and also noted feeling shaky with exercise. She purchased glucose tablets instead of eating candy to help when she feels like this. -Rx DM meter and supplies to check 2x per day  2. Migraines controlled and not having to use imitrex as much she went to the dentist and they noted she was grinding her teeth and had to get a crown on lower left back tooth due to grinding. Now she has a mouth guard which ehlps.   3. Still having epigastric pain though increased protonix to 40 mg bid x 6 weeks started 11/17/17 then reduce to qd and given carafate 1 bid x 6 weeks by [facility] 01/01/18 she will call them back to sch EGD as sx's not better.  4. Chronic knee b/l and right shoulder pain she has mild OA AC right shoulder on Xray 01/17/18 and has had 3 steroids shots at [facility] into her joints  5. Of note went to ed 01/16/18 due to picking up glass and had cut right index finger and given tdap 01/16/18    Review of Systems  Constitutional: Positive for weight loss.       Down 10 lbs since last visit   HENT: Negative for hearing loss.   Eyes: Negative for blurred vision.  Respiratory: Negative for shortness of breath.   Cardiovascular: Negative for chest pain.  Gastrointestinal: Positive for abdominal pain.  Musculoskeletal: Positive for joint pain.  Skin: Negative for rash.  Endo/Heme/Allergies:       +hypoglycemia   Psychiatric/Behavioral: Negative for depression.   Past Medical History:  Diagnosis Date  . Acid reflux   . Allergic rhinitis   . Allergy   . Anxiety   . Heart murmur   . History of hiatal hernia   . Kidney stones   . Migraine   . Sleep apnea    Past Surgical History:  Procedure Laterality Date  . ABDOMINAL HYSTERECTOMY     2008  . BREAST BIOPSY Right 12/19/2013   stereo right- neg  .  CHOLECYSTECTOMY    . COLONOSCOPY WITH PROPOFOL N/A 05/05/2016   Procedure: COLONOSCOPY WITH PROPOFOL;  Surgeon: Lollie Sails, MD;  Location: [facility] ENDOSCOPY;  Service: Endoscopy;  Laterality: N/A;  . ESOPHAGOGASTRODUODENOSCOPY (EGD) WITH PROPOFOL N/A 05/05/2016   Procedure: ESOPHAGOGASTRODUODENOSCOPY (EGD) WITH PROPOFOL;  Surgeon: Lollie Sails, MD;  Location: [facility] ENDOSCOPY;  Service: Endoscopy;  Laterality: N/A;  . EXTRACORPOREAL SHOCK WAVE LITHOTRIPSY Left 11/06/2016   Procedure: EXTRACORPOREAL SHOCK WAVE LITHOTRIPSY (ESWL);  Surgeon: Nickie Retort, MD;  Location: [facility] ORS;  Service: Urology;  Laterality: Left;  . gallbladder  2012  . GASTRIC BYPASS  2012  . REDUCTION MAMMAPLASTY Bilateral    2010  . REDUCTION MAMMAPLASTY     Family History  Problem Relation Age of Onset  . Prostate cancer Father   . Hyperlipidemia Father   . Hypertension Father   . Depression Father   . Kidney Stones Maternal Grandfather   . Stroke Maternal Grandfather   . Cancer Maternal Grandfather        prostate cancer   . Diabetes Mother   . Hyperlipidemia Mother   . Hypertension Mother   . Heart disease Maternal Grandmother   . Cancer Other        breast  cancer   . Kidney disease Neg Hx   . Bladder Cancer Neg Hx   . Breast cancer Neg Hx    Social History   Socioeconomic History  . Marital status: Married    Spouse name: Not on file  . Number of children: Not on file  . Years of education: Not on file  . Highest education level: Not on file  Occupational History  . Not on file  Social Needs  . Financial resource strain: Not on file  . Food insecurity:    Worry: Not on file    Inability: Not on file  . Transportation needs:    Medical: Not on file    Non-medical: Not on file  Tobacco Use  . Smoking status: Never Smoker  . Smokeless tobacco: Never Used  Substance and Sexual Activity  . Alcohol use: No    Alcohol/week: 0.0 oz  . Drug use: No  . Sexual activity: Yes     Comment: female partner   Lifestyle  . Physical activity:    Days per week: Not on file    Minutes per session: Not on file  . Stress: Not on file  Relationships  . Social connections:    Talks on phone: Not on file    Gets together: Not on file    Attends religious service: Not on file    Active member of club or organization: Not on file    Attends meetings of clubs or organizations: Not on file    Relationship status: Not on file  . Intimate partner violence:    Fear of current or ex partner: Not on file    Emotionally abused: Not on file    Physically abused: Not on file    Forced sexual activity: Not on file  Other Topics Concern  . 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 cetirizine (ZYRTEC) 10 MG tablet Take 10 mg by mouth daily.  . Cholecalciferol (VITAMIN D3 HIGH POTENCY) 1000 units capsule Take 1,000 Units by mouth daily.  . Cyanocobalamin (RA VITAMIN B-12 TR) 1000 MCG TBCR Take by mouth.  . ferrous sulfate 325 (65 FE) MG EC tablet Take 325 mg by mouth 3 (three) times daily with meals.  . fluticasone (FLONASE) 50 MCG/ACT nasal spray Place 1 spray into both nostrils daily.  . Magnesium 400 MG CAPS Take by mouth.  . Multiple Vitamin (MULTIVITAMIN) tablet Take 1 tablet by mouth daily.  . pantoprazole (PROTONIX) 20 MG tablet Take 1 tablet (20 mg total) by mouth daily. Before food 30 minutes  . SUMAtriptan (IMITREX) 100 MG tablet TK 1 T PO ONCE PRF MIGRAINE. MAY TAKE A SECOND DOSE AFTER 2 H IF NEEDED   No Known Allergies No results found for this or any previous visit (from the past 2160 hour(s)). Objective  Body mass index is 28.7 kg/m. Wt Readings from Last 3 Encounters:  02/12/18 177 lb 12.8 oz (80.6 kg)  01/16/18 180 lb (81.6 kg)  11/13/17 187 lb 9.6 oz (85.1  kg)   Temp Readings from Last 3 Encounters:  02/12/18 98.3 F (36.8 C) (Oral)  01/16/18 97.9 F (36.6 C) (Oral)  11/13/17 98 F (36.7 C) (  Oral)   BP Readings from Last 3 Encounters:  02/12/18 102/68  01/16/18 134/72  11/13/17 (!) 96/58   Pulse Readings from Last 3 Encounters:  02/12/18 76  01/16/18 63  11/13/17 82    Physical Exam  Constitutional: She is oriented to person, place, and time. Vital signs are normal. She appears well-developed and well-nourished. She is cooperative.  HENT:  Head: Normocephalic and atraumatic.  Mouth/Throat: Oropharynx is clear and moist and mucous membranes are normal.  Eyes: Pupils are equal, round, and reactive to light. Conjunctivae are normal.  Cardiovascular: Normal rate, regular rhythm and normal heart sounds.  No murmer heard   Pulmonary/Chest: Effort normal and breath sounds normal.  Neurological: She is alert and oriented to person, place, and time. Gait normal.  Skin: Skin is warm and dry.     Psychiatric: She has a normal mood and affect. Her speech is normal and behavior is normal. Judgment and thought content normal. Cognition and memory are normal.  Nursing note and vitals reviewed.   Assessment   1. 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 Migraines improved  3. Epigastric pain h/o chronic gastritis  4. Chronic b/l knee and right shoulder pain  5. No cardiac murmur heard on exam though pt states outside provider heard murmur  -reviewed notes 2017 Dr. Ubaldo Glassing (cards) no murmur noted  6. HM 7. Hypotension ? Related to adrenal issues. Improved today   Plan   1.  Seek opinion of Dr. Gabriel Carina and Dr. Darnell Level as to how to treat medically Continue prn Glucose tablets and meter to check cbg 2x per day  Consider gastric emptying study This can be complication s/p gastric bypass and per utd pancreas surgery could correct but pt declines to have surgery  rec small meals q3 hours while awake and increase  fiber and reduced sugary foods and reg exercise per utd  2. Prn meds controlled using mouth guard at night for grinding which is also helping  3.will call [facility] back to sch EGD  Consider gastric emptying study  4. F/u [facility]  5.  Still need to get records echo from Dr. Ubaldo Glassing from 2017  6.  Had flu shot 07/04/17 Tdap had 01/24/16 and 01/16/18  Hep B immune. Check MMR status  Check lipid and cortisol and mmr next week  Consider check HIV in future   S/p hysterectomy will ask if had abnormal pap I nthe past at f/u mammo due 03/13/18 ordered sch 03/19/18  Colonoscopy had 04/2016 [facility] repeat rec in 5 years [facility] pending referral appt     7. Check am cortisol next week   Provider: Dr. Olivia Mackie McLean-Scocuzza-Internal Medicine

## 2018-02-14 ENCOUNTER — Encounter: Payer: Self-pay | Admitting: Internal Medicine

## 2018-02-14 ENCOUNTER — Telehealth: Payer: Self-pay | Admitting: Internal Medicine

## 2018-02-14 NOTE — Telephone Encounter (Signed)
Fax my note to Dr. Lucilla Lame (endocrine) and  Dr. Kreg Shropshire   Attn :   This patient has post prandial hypoglycemia and hypoglycemia after gastric bypass   Do you have any suggestions?

## 2018-02-14 NOTE — Telephone Encounter (Signed)
I still need the echo from Dr. Ubaldo Glassing (cardiology) from 2017 call to get faxed please   [place]

## 2018-02-14 NOTE — Telephone Encounter (Signed)
Call pt she needs to take 5000 IU D3 otc daily   [place]

## 2018-02-15 DIAGNOSIS — J301 Allergic rhinitis due to pollen: Secondary | ICD-10-CM | POA: Diagnosis not present

## 2018-02-15 NOTE — Telephone Encounter (Signed)
Patient has been informed.

## 2018-02-15 NOTE — Telephone Encounter (Signed)
Note sent electronically to providers.

## 2018-02-22 ENCOUNTER — Telehealth: Payer: Self-pay | Admitting: Internal Medicine

## 2018-02-22 NOTE — Telephone Encounter (Signed)
Inform patient   Diabetes doctor rec stop all concentrated sweets no dessert type foods, no sweet drinks, reduce intake of high carb foods I.e pasta, pizza, rice  Low carb diet is effective   I have not heard from Dr. Darnell Level  [place]

## 2018-02-22 NOTE — Telephone Encounter (Signed)
mychart message has been sent to patient

## 2018-02-26 ENCOUNTER — Other Ambulatory Visit (INDEPENDENT_AMBULATORY_CARE_PROVIDER_SITE_OTHER): Payer: BLUE CROSS/BLUE SHIELD

## 2018-02-26 DIAGNOSIS — Z1159 Encounter for screening for other viral diseases: Secondary | ICD-10-CM | POA: Diagnosis not present

## 2018-02-26 DIAGNOSIS — Z13220 Encounter for screening for lipoid disorders: Secondary | ICD-10-CM

## 2018-02-26 DIAGNOSIS — E162 Hypoglycemia, unspecified: Secondary | ICD-10-CM

## 2018-02-26 DIAGNOSIS — I959 Hypotension, unspecified: Secondary | ICD-10-CM

## 2018-02-26 DIAGNOSIS — Z0184 Encounter for antibody response examination: Secondary | ICD-10-CM

## 2018-02-26 LAB — LIPID PANEL
Cholesterol: 163 mg/dL (ref 0–200)
HDL: 78.2 mg/dL (ref >39.00)
LDL Cholesterol: 74 mg/dL (ref 0–99)
NONHDL: 84.87
Total CHOL/HDL Ratio: 2
Triglycerides: 53 mg/dL (ref 0.0–149.0)
VLDL: 10.6 mg/dL (ref 0.0–40.0)

## 2018-02-26 LAB — CORTISOL: CORTISOL PLASMA: 7.2 ug/dL

## 2018-03-01 LAB — MEASLES/MUMPS/RUBELLA IMMUNITY
Mumps IgG: 80.1 AU/mL
RUBELLA: 1.28 {index}

## 2018-03-11 ENCOUNTER — Encounter: Payer: Self-pay | Admitting: Internal Medicine

## 2018-03-12 ENCOUNTER — Other Ambulatory Visit: Payer: Self-pay | Admitting: Internal Medicine

## 2018-03-12 DIAGNOSIS — F419 Anxiety disorder, unspecified: Secondary | ICD-10-CM

## 2018-03-12 MED ORDER — LORAZEPAM 0.5 MG PO TABS: 0.5000 mg | ORAL_TABLET | Freq: Every evening | ORAL | 0 refills | Status: DC | PRN

## 2018-03-15 ENCOUNTER — Other Ambulatory Visit: Payer: Self-pay | Admitting: Internal Medicine

## 2018-03-15 DIAGNOSIS — F419 Anxiety disorder, unspecified: Secondary | ICD-10-CM

## 2018-03-15 MED ORDER — SERTRALINE HCL 50 MG PO TABS: 50.0000 mg | ORAL_TABLET | Freq: Every day | ORAL | 2 refills | Status: DC

## 2018-03-19 ENCOUNTER — Ambulatory Visit
Admission: RE | Admit: 2018-03-19 | Discharge: 2018-03-19 | Disposition: A | Discharge status: Home / Self Care | Payer: BLUE CROSS/BLUE SHIELD | Source: Ambulatory Visit | Attending: Internal Medicine | Admitting: Internal Medicine

## 2018-03-19 DIAGNOSIS — Z1231 Encounter for screening mammogram for malignant neoplasm of breast: Secondary | ICD-10-CM | POA: Diagnosis not present

## 2018-03-22 ENCOUNTER — Encounter: Payer: Self-pay | Admitting: *Deleted

## 2018-03-22 ENCOUNTER — Other Ambulatory Visit: Payer: Self-pay | Admitting: Internal Medicine

## 2018-03-22 DIAGNOSIS — N6489 Other specified disorders of breast: Secondary | ICD-10-CM

## 2018-03-22 DIAGNOSIS — R928 Other abnormal and inconclusive findings on diagnostic imaging of breast: Secondary | ICD-10-CM

## 2018-04-02 ENCOUNTER — Ambulatory Visit
Admission: RE | Admit: 2018-04-02 | Discharge: 2018-04-02 | Disposition: A | Discharge status: Home / Self Care | Payer: BLUE CROSS/BLUE SHIELD | Source: Ambulatory Visit | Attending: Internal Medicine | Admitting: Internal Medicine

## 2018-04-02 DIAGNOSIS — N6002 Solitary cyst of left breast: Secondary | ICD-10-CM | POA: Diagnosis not present

## 2018-04-02 DIAGNOSIS — R928 Other abnormal and inconclusive findings on diagnostic imaging of breast: Secondary | ICD-10-CM | POA: Diagnosis not present

## 2018-04-02 DIAGNOSIS — N6489 Other specified disorders of breast: Secondary | ICD-10-CM | POA: Diagnosis not present

## 2018-04-02 DIAGNOSIS — R922 Inconclusive mammogram: Secondary | ICD-10-CM | POA: Diagnosis not present

## 2018-04-12 ENCOUNTER — Encounter: Payer: Self-pay | Admitting: Emergency Medicine

## 2018-04-12 ENCOUNTER — Emergency Department
Admission: EM | Admit: 2018-04-12 | Discharge: 2018-04-12 | Disposition: A | Discharge status: Home / Self Care | Payer: BLUE CROSS/BLUE SHIELD | Attending: Emergency Medicine | Admitting: Emergency Medicine

## 2018-04-12 DIAGNOSIS — N202 Calculus of kidney with calculus of ureter: Secondary | ICD-10-CM | POA: Diagnosis not present

## 2018-04-12 DIAGNOSIS — R109 Unspecified abdominal pain: Secondary | ICD-10-CM

## 2018-04-12 DIAGNOSIS — N200 Calculus of kidney: Secondary | ICD-10-CM | POA: Diagnosis not present

## 2018-04-12 DIAGNOSIS — R1032 Left lower quadrant pain: Secondary | ICD-10-CM | POA: Diagnosis not present

## 2018-04-12 DIAGNOSIS — Z79899 Other long term (current) drug therapy: Secondary | ICD-10-CM | POA: Insufficient documentation

## 2018-04-12 LAB — BASIC METABOLIC PANEL
ANION GAP: 7 (ref 5–15)
BUN: 17 mg/dL (ref 6–20)
CALCIUM: 8.3 mg/dL — AB (ref 8.9–10.3)
CO2: 26 mmol/L (ref 22–32)
Chloride: 107 mmol/L (ref 98–111)
Creatinine, Ser: 0.82 mg/dL (ref 0.44–1.00)
Glucose, Bld: 94 mg/dL (ref 70–99)
Potassium: 3.8 mmol/L (ref 3.5–5.1)
Sodium: 140 mmol/L (ref 135–145)

## 2018-04-12 LAB — URINALYSIS, COMPLETE (UACMP) WITH MICROSCOPIC
Bacteria, UA: NONE SEEN
Bilirubin Urine: NEGATIVE
GLUCOSE, UA: NEGATIVE mg/dL
HGB URINE DIPSTICK: NEGATIVE
KETONES UR: NEGATIVE mg/dL
Leukocytes, UA: NEGATIVE
Nitrite: NEGATIVE
PROTEIN: NEGATIVE mg/dL
Specific Gravity, Urine: 1.026 (ref 1.005–1.030)
pH: 5 (ref 5.0–8.0)

## 2018-04-12 LAB — CBC WITH DIFFERENTIAL/PLATELET
BASOS ABS: 0 10*3/uL (ref 0–0.1)
BASOS PCT: 0 %
EOS PCT: 2 %
Eosinophils Absolute: 0.1 10*3/uL (ref 0–0.7)
HCT: 35.2 % (ref 35.0–47.0)
Hemoglobin: 11.9 g/dL — ABNORMAL LOW (ref 12.0–16.0)
Lymphocytes Relative: 36 %
Lymphs Abs: 1.9 10*3/uL (ref 1.0–3.6)
MCH: 32.7 pg (ref 26.0–34.0)
MCHC: 33.8 g/dL (ref 32.0–36.0)
MCV: 96.6 fL (ref 80.0–100.0)
MONO ABS: 0.5 10*3/uL (ref 0.2–0.9)
Monocytes Relative: 10 %
NEUTROS ABS: 2.7 10*3/uL (ref 1.4–6.5)
Neutrophils Relative %: 52 %
Platelets: 270 10*3/uL (ref 150–440)
RBC: 3.65 MIL/uL — ABNORMAL LOW (ref 3.80–5.20)
RDW: 14 % (ref 11.5–14.5)
WBC: 5.2 10*3/uL (ref 3.6–11.0)

## 2018-04-12 MED ORDER — KETOROLAC TROMETHAMINE 30 MG/ML IJ SOLN
30.0000 mg | Freq: Once | INTRAMUSCULAR | Status: AC
  Administered 2018-04-12: 30 mg via INTRAVENOUS
  Filled 2018-04-12: qty 1

## 2018-04-12 MED ORDER — TAMSULOSIN HCL 0.4 MG PO CAPS: 0.4000 mg | ORAL_CAPSULE | Freq: Every day | ORAL | 0 refills | Status: DC

## 2018-04-12 MED ORDER — ONDANSETRON HCL 4 MG PO TABS: 4.0000 mg | ORAL_TABLET | Freq: Three times a day (TID) | ORAL | 0 refills | Status: DC | PRN

## 2018-04-12 MED ORDER — SODIUM CHLORIDE 0.9 % IV BOLUS
1000.0000 mL | Freq: Once | INTRAVENOUS | Status: AC
  Administered 2018-04-12: 1000 mL via INTRAVENOUS

## 2018-04-12 MED ORDER — KETOROLAC TROMETHAMINE 10 MG PO TABS: 10.0000 mg | ORAL_TABLET | Freq: Three times a day (TID) | ORAL | 0 refills | Status: DC | PRN

## 2018-04-12 NOTE — ED Notes (Signed)
NAD noted at time of D/C. Pt denies questions or concerns. Pt ambulatory to the lobby at this time. Pt given new strainer to strain urine in the event she passes a kidney stone.

## 2018-04-12 NOTE — ED Provider Notes (Signed)
[facility] Emergency Department Provider Note  ____________________________________________   I have reviewed the triage vital signs and the nursing notes.   HISTORY  Chief Complaint Flank Pain   History limited by: Not Limited   HPI Valerie Colon is a 47 y.o. female who presents to the emergency department today because of concern for left flank pain and left lower abdominal pain. The patient states that she started having some discomfort in the left flank a couple of days ago. It then radiated down and is now primarily in the left lower quadrant. Patient states that the pain reminds her of when she has had kidney stones in the past. States she has passed roughly 12. She denies any fevers. Has had slight increase in frequency of urination without any painful urination or obvious hematuria.    Per medical record review patient has a history of kidney stones, abdominal hysterectomy.  Past Medical History:  Diagnosis Date  . Acid reflux   . Allergic rhinitis   . Allergy   . Anxiety   . Heart murmur   . History of hiatal hernia   . Kidney stones   . Migraine     Patient Active Problem List   Diagnosis Date Noted  . Cardiac murmur 02/12/2018  . Postprandial hypoglycemia 02/12/2018  . Epigastric abdominal pain 11/13/2017  . Hypotension 11/13/2017  . Chronic gastritis 10/09/2017  . Migraine headache without aura 10/09/2017  . Soft tissue lesion of elbow region 10/09/2017  . Knee pain, bilateral 10/09/2017  . Shoulder pain, right 10/09/2017  . Pelvic pain 10/09/2017  . Breast calcifications 10/09/2017  . Nephrolithiasis 02/29/2016  . Chronic fatigue 01/24/2016  . Dizziness 01/24/2016  . Gastroesophageal reflux disease without esophagitis 01/24/2016  . GAD (generalized anxiety disorder) 11/20/2015  . Emotional stress 11/09/2015  . Intermittent palpitations 11/09/2015  . Vitamin D deficiency 07/09/2015  . Allergic rhinitis 03/29/2015  . Chronic  anemia 01/08/2015  . Prediabetes 01/08/2015  . Obstructive apnea 01/20/2014  . Hypoglycemia after GI (gastrointestinal) surgery 11/26/2012    Past Surgical History:  Procedure Laterality Date  . ABDOMINAL HYSTERECTOMY     2008  . BREAST BIOPSY Right 12/19/2013   stereo right- neg  . CHOLECYSTECTOMY    . COLONOSCOPY WITH PROPOFOL N/A 05/05/2016   Procedure: COLONOSCOPY WITH PROPOFOL;  Surgeon: Lollie Sails, MD;  Location: [facility] ENDOSCOPY;  Service: Endoscopy;  Laterality: N/A;  . ESOPHAGOGASTRODUODENOSCOPY (EGD) WITH PROPOFOL N/A 05/05/2016   Procedure: ESOPHAGOGASTRODUODENOSCOPY (EGD) WITH PROPOFOL;  Surgeon: Lollie Sails, MD;  Location: [facility] ENDOSCOPY;  Service: Endoscopy;  Laterality: N/A;  . EXTRACORPOREAL SHOCK WAVE LITHOTRIPSY Left 11/06/2016   Procedure: EXTRACORPOREAL SHOCK WAVE LITHOTRIPSY (ESWL);  Surgeon: Nickie Retort, MD;  Location: [facility] ORS;  Service: Urology;  Laterality: Left;  . gallbladder  2012  . GASTRIC BYPASS  2012  . REDUCTION MAMMAPLASTY Bilateral    2010    Prior to Admission medications   Medication Sig Start Date End Date Taking? Authorizing Provider  blood glucose meter kit and supplies KIT Dispense based on patient and insurance preference. Use up to four times daily as directed. (FOR ICD-9 250.00, 250.01). 02/12/18   McLean-Scocuzza, Nino Glow, MD  cetirizine (ZYRTEC) 10 MG tablet Take 10 mg by mouth daily.    [provider]  Cyanocobalamin (RA VITAMIN B-12 TR) 1000 MCG TBCR Take by mouth.    [provider]  ferrous sulfate 325 (65 FE) MG EC tablet Take 325 mg by mouth 3 (  three) times daily with meals.    [provider]  fluticasone (FLONASE) 50 MCG/ACT nasal spray Place 1 spray into both nostrils daily.    [provider]  Glucose Blood (BLOOD GLUCOSE TEST STRIPS) STRP 1 Device by In Vitro route 2 (two) times daily. 02/12/18   McLean-Scocuzza, Nino Glow, MD  Lancets MISC 1 Device by Does not apply route 2 (two)  times daily. 02/12/18   McLean-Scocuzza, Nino Glow, MD  LORazepam (ATIVAN) 0.5 MG tablet Take 1 tablet (0.5 mg total) by mouth at bedtime as needed for anxiety. 03/12/18   McLean-Scocuzza, Nino Glow, MD  Magnesium 400 MG CAPS Take by mouth.    [provider]  Multiple Vitamin (MULTIVITAMIN) tablet Take 1 tablet by mouth daily.    [provider]  pantoprazole (PROTONIX) 20 MG tablet Take 1 tablet (20 mg total) by mouth daily. Before food 30 minutes Patient taking differently: Take 40 mg by mouth daily. Before food 30 minutes 10/09/17   McLean-Scocuzza, Nino Glow, MD  sertraline (ZOLOFT) 50 MG tablet Take 1 tablet (50 mg total) by mouth daily. In am 03/15/18   McLean-Scocuzza, Nino Glow, MD  sucralfate (CARAFATE) 1 g tablet Take 1 g by mouth 2 (two) times daily.    [provider]  SUMAtriptan (IMITREX) 100 MG tablet TK 1 T PO ONCE PRF MIGRAINE. MAY TAKE A SECOND DOSE AFTER 2 H IF NEEDED 11/09/17   [provider]    Allergies Patient has no known allergies.  Family History  Problem Relation Age of Onset  . Prostate cancer Father   . Hyperlipidemia Father   . Hypertension Father   . Depression Father   . Kidney Stones Maternal Grandfather   . Stroke Maternal Grandfather   . Cancer Maternal Grandfather        prostate cancer   . Diabetes Mother   . Hyperlipidemia Mother   . Hypertension Mother   . Heart disease Maternal Grandmother   . Cancer Other        breast cancer   . Breast cancer Other   . Kidney disease Neg Hx   . Bladder Cancer Neg Hx     Social History Social History   Tobacco Use  . Smoking status: Never Smoker  . Smokeless tobacco: Never Used  Substance Use Topics  . Alcohol use: No    Alcohol/week: 0.0 standard drinks  . Drug use: No    Review of Systems Constitutional: No fever/chills Eyes: No visual changes. ENT: No sore throat. Cardiovascular: Denies chest pain. Respiratory: Denies shortness of breath. Gastrointestinal:  Positive for left flank, left lower quadrant pain.  Genitourinary: Negative for dysuria. Musculoskeletal: Negative for back pain. Skin: Negative for rash. Neurological: Negative for headaches, focal weakness or numbness.  ____________________________________________   PHYSICAL EXAM:  VITAL SIGNS: ED Triage Vitals  Enc Vitals Group     BP 04/12/18 0643 115/66     Pulse Rate 04/12/18 0643 (!) 59     Resp 04/12/18 0643 18     Temp 04/12/18 0643 97.9 F (36.6 C)     Temp Source 04/12/18 0643 Oral     SpO2 04/12/18 0643 100 %     Weight 04/12/18 0644 180 lb (81.6 kg)     Height 04/12/18 0644 '5\' 6"'$  (1.676 m)     Head Circumference --      Peak Flow --      Pain Score 04/12/18 0643 7   Constitutional: Alert and oriented.  Eyes:  Conjunctivae are normal.  ENT      Head: Normocephalic and atraumatic.      Nose: No congestion/rhinnorhea.      Mouth/Throat: Mucous membranes are moist.      Neck: No stridor. Hematological/Lymphatic/Immunilogical: No cervical lymphadenopathy. Cardiovascular: Normal rate, regular rhythm.  No murmurs, rubs, or gallops.  Respiratory: Normal respiratory effort without tachypnea nor retractions. Breath sounds are clear and equal bilaterally. No wheezes/rales/rhonchi. Gastrointestinal: Soft and non tender. No rebound. No guarding.  Genitourinary: Deferred Musculoskeletal: Normal range of motion in all extremities. No lower extremity edema. Neurologic:  Normal speech and language. No gross focal neurologic deficits are appreciated.  Skin:  Skin is warm, dry and intact. No rash noted. Psychiatric: Mood and affect are normal. Speech and behavior are normal. Patient exhibits appropriate insight and judgment.  ____________________________________________    LABS (pertinent positives/negatives)  BMP wnl except ca 8.3 CBC wbc 5.2, hgb 11.9, plt 270 UA hazy, rbc 6-10, wbc  0-5  ____________________________________________   EKG  None  ____________________________________________    RADIOLOGY  None  ____________________________________________   PROCEDURES  Procedures  ____________________________________________   INITIAL IMPRESSION / ASSESSMENT AND PLAN / ED COURSE  Pertinent labs & imaging results that were available during my care of the patient were reviewed by me and considered in my medical decision making (see chart for details).   Presented to the emergency department today because of concerns for left flank pain that reminded her previous kidney stones.  Urine does have some red blood cells in it.  No signs of infection in the urine or serum.  Kidney function within normal limits.  At this point I think kidney stone likely.  Patient did feel better after Toradol and IV fluids.  Will discharge with Flomax Zofran and Toradol.  Patient follows up with [facility] urologic.  Discussed infection return precautions.  ____________________________________________   FINAL CLINICAL IMPRESSION(S) / ED DIAGNOSES  Final diagnoses:  Flank pain  Kidney stone     Note: This dictation was prepared with Dragon dictation. Any transcriptional errors that result from this process are unintentional     Nance Pear, MD 04/12/18 [phone]

## 2018-04-12 NOTE — ED Triage Notes (Signed)
Patient with complaint of left flank pain that started last night. Patient states that she has a history of a kidney stone and the pain feels the same.

## 2018-04-12 NOTE — ED Notes (Signed)
Pt states she is feeling better. MD to bedside to speak with patient at this time.

## 2018-04-12 NOTE — Discharge Instructions (Addendum)
Please seek medical attention for any high fevers, chest pain, shortness of breath, change in behavior, persistent vomiting, bloody stool or any other new or concerning symptoms.  

## 2018-04-21 ENCOUNTER — Ambulatory Visit
Admission: RE | Admit: 2018-04-21 | Discharge: 2018-04-21 | Disposition: A | Discharge status: Home / Self Care | Payer: BLUE CROSS/BLUE SHIELD | Source: Ambulatory Visit | Attending: Urology | Admitting: Urology

## 2018-04-21 ENCOUNTER — Ambulatory Visit: Payer: BLUE CROSS/BLUE SHIELD | Admitting: Urology

## 2018-04-21 VITALS — BP 116/77 | HR 77 | Ht 66.0 in | Wt 191.0 lb

## 2018-04-21 DIAGNOSIS — N200 Calculus of kidney: Secondary | ICD-10-CM | POA: Insufficient documentation

## 2018-04-21 DIAGNOSIS — R160 Hepatomegaly, not elsewhere classified: Secondary | ICD-10-CM | POA: Diagnosis not present

## 2018-04-21 DIAGNOSIS — Z87442 Personal history of urinary calculi: Secondary | ICD-10-CM

## 2018-04-21 DIAGNOSIS — R109 Unspecified abdominal pain: Secondary | ICD-10-CM

## 2018-04-21 DIAGNOSIS — R3129 Other microscopic hematuria: Secondary | ICD-10-CM

## 2018-04-21 DIAGNOSIS — J301 Allergic rhinitis due to pollen: Secondary | ICD-10-CM | POA: Diagnosis not present

## 2018-04-21 DIAGNOSIS — J948 Other specified pleural conditions: Secondary | ICD-10-CM | POA: Diagnosis not present

## 2018-04-21 LAB — URINALYSIS, COMPLETE
BILIRUBIN UA: NEGATIVE
GLUCOSE, UA: NEGATIVE
Ketones, UA: NEGATIVE
Leukocytes, UA: NEGATIVE
Nitrite, UA: NEGATIVE
PROTEIN UA: NEGATIVE
RBC UA: NEGATIVE
SPEC GRAV UA: 1.025 (ref 1.005–1.030)
UUROB: 0.2 mg/dL (ref 0.2–1.0)
pH, UA: 5.5 (ref 5.0–7.5)

## 2018-04-21 NOTE — Progress Notes (Signed)
04/21/2018 4:27 PM   Valerie Colon [birth year] [id number]  Referring provider: McLean-Scocuzza, Nino Glow, MD [place], [place] [address]  Chief Complaint  Patient presents with  . Nephrolithiasis    HPI: Patient is a 47 year old African American female with a history of nephrolithiasis who was seen in the [facility] ED on 04/12/2018.  ED: She presented with left flank pain radiating to her left lower quadrant associated with urinary frequency.     Labs in the ED:  Creatinine 0.82, WBC count 5.2, UA 6-10 RBC's and 0-5 WBC's.     Meds given in the ED: Toradol, Zofran and Flomax; strainer  Prior urological history:  Spontaneous passage of numerous stones, composition is unknown   Current NSAID/anticoagulation:   Toradol po   Today: She is having left flank pain that is radiating to her left lower quadrant.  She states nothing helps the pain and nothing makes the pain worse.  She describes the pain as stabbing.  It is a 4/10.  Patient denies any gross hematuria, dysuria or suprapubic/flank pain.  Patient denies any fevers, chills, nausea or vomiting.    Her UA is negative.    CT renal stone study on 04/21/2018 revealed right renal stones.  No obstruction.  Trace bilateral pleural fluid.  Mild hepatomegaly.  She did have a left ovarian cyst.     PMH: Past Medical History:  Diagnosis Date  . Acid reflux   . Allergic rhinitis   . Allergy   . Anxiety   . Heart murmur   . History of hiatal hernia   . Kidney stones   . Migraine     Surgical History: Past Surgical History:  Procedure Laterality Date  . ABDOMINAL HYSTERECTOMY     2008  . BREAST BIOPSY Right 12/19/2013   stereo right- neg  . CHOLECYSTECTOMY    . COLONOSCOPY WITH PROPOFOL N/A 05/05/2016   Procedure: COLONOSCOPY WITH PROPOFOL;  Surgeon: Lollie Sails, MD;  Location: [facility] ENDOSCOPY;  Service: Endoscopy;  Laterality: N/A;  . ESOPHAGOGASTRODUODENOSCOPY (EGD) WITH PROPOFOL N/A 05/05/2016   Procedure: ESOPHAGOGASTRODUODENOSCOPY (EGD) WITH PROPOFOL;  Surgeon: Lollie Sails, MD;  Location: [facility] ENDOSCOPY;  Service: Endoscopy;  Laterality: N/A;  . EXTRACORPOREAL SHOCK WAVE LITHOTRIPSY Left 11/06/2016   Procedure: EXTRACORPOREAL SHOCK WAVE LITHOTRIPSY (ESWL);  Surgeon: Nickie Retort, MD;  Location: [facility] ORS;  Service: Urology;  Laterality: Left;  . gallbladder  2012  . GASTRIC BYPASS  2012  . REDUCTION MAMMAPLASTY Bilateral    2010    Home Medications:  Allergies as of 04/21/2018   No Known Allergies     Medication List        Accurate as of 04/21/18 11:59 PM. Always use your most recent med list.          blood glucose meter kit and supplies Kit Dispense based on patient and insurance preference. Use up to four times daily as directed. (FOR ICD-9 250.00, 250.01).   BLOOD GLUCOSE TEST STRIPS Strp 1 Device by In Vitro route 2 (two) times daily.   cetirizine 10 MG tablet Commonly known as:  ZYRTEC Take 10 mg by mouth daily.   ferrous sulfate 325 (65 FE) MG EC tablet Take 325 mg by mouth 3 (three) times daily with meals.   fluticasone 50 MCG/ACT nasal spray Commonly known as:  FLONASE Place 1 spray into both nostrils daily.   ketorolac 10 MG tablet Commonly known as:  TORADOL Take 1 tablet (10 mg  total) by mouth every 8 (eight) hours as needed for severe pain.   Lancets Misc 1 Device by Does not apply route 2 (two) times daily.   LORazepam 0.5 MG tablet Commonly known as:  ATIVAN Take 1 tablet (0.5 mg total) by mouth at bedtime as needed for anxiety.   Magnesium 400 MG Caps Take by mouth.   multivitamin tablet Take 1 tablet by mouth daily.   ondansetron 4 MG tablet Commonly known as:  ZOFRAN Take 1 tablet (4 mg total) by mouth every 8 (eight) hours as needed for nausea or vomiting.   pantoprazole 20 MG tablet Commonly known as:  PROTONIX Take 1 tablet (20 mg total) by mouth daily. Before food 30 minutes   RA VITAMIN B-12 TR 1000 MCG  Tbcr Generic drug:  Cyanocobalamin Take by mouth.   sertraline 50 MG tablet Commonly known as:  ZOLOFT Take 1 tablet (50 mg total) by mouth daily. In am   sucralfate 1 g tablet Commonly known as:  CARAFATE Take 1 g by mouth 2 (two) times daily.   SUMAtriptan 100 MG tablet Commonly known as:  IMITREX TK 1 T PO ONCE PRF MIGRAINE. MAY TAKE A SECOND DOSE AFTER 2 H IF NEEDED   tamsulosin 0.4 MG Caps capsule Commonly known as:  FLOMAX Take 1 capsule (0.4 mg total) by mouth daily.       Allergies: No Known Allergies  Family History: Family History  Problem Relation Age of Onset  . Prostate cancer Father   . Hyperlipidemia Father   . Hypertension Father   . Depression Father   . Kidney Stones Maternal Grandfather   . Stroke Maternal Grandfather   . Cancer Maternal Grandfather        prostate cancer   . Diabetes Mother   . Hyperlipidemia Mother   . Hypertension Mother   . Heart disease Maternal Grandmother   . Cancer Other        breast cancer   . Breast cancer Other   . Kidney disease Neg Hx   . Bladder Cancer Neg Hx     Social History:  reports that she has never smoked. She has never used smokeless tobacco. She reports that she does not drink alcohol or use drugs.  ROS: UROLOGY Frequent Urination?: No Hard to postpone urination?: No Burning/pain with urination?: No Get up at night to urinate?: No Leakage of urine?: No Urine stream starts and stops?: No Trouble starting stream?: No Do you have to strain to urinate?: No Blood in urine?: No Urinary tract infection?: No Sexually transmitted disease?: No Injury to kidneys or bladder?: No Painful intercourse?: No Weak stream?: No Currently pregnant?: No Vaginal bleeding?: No Last menstrual period?: n  Gastrointestinal Nausea?: No Vomiting?: No Indigestion/heartburn?: No Diarrhea?: No Constipation?: No  Constitutional Fever: No Night sweats?: No Weight loss?: No Fatigue?: No  Skin Skin  rash/lesions?: No Itching?: No  Eyes Blurred vision?: No Double vision?: No  Ears/Nose/Throat Sore throat?: No Sinus problems?: No  Hematologic/Lymphatic Swollen glands?: No Easy bruising?: No  Cardiovascular Leg swelling?: No Chest pain?: No  Respiratory Cough?: No Shortness of breath?: No  Endocrine Excessive thirst?: No  Musculoskeletal Back pain?: No Joint pain?: No  Neurological Headaches?: No Dizziness?: No  Psychologic Depression?: No Anxiety?: No  Physical Exam: BP 116/77 (BP Location: Left Arm, Patient Position: Sitting)   Pulse 77   Ht 5' 6" (1.676 m)   Wt 191 lb (86.6 kg)   LMP  (LMP Unknown)  BMI 30.83 kg/m   Constitutional:  Well nourished. Alert and oriented, No acute distress. HEENT: [place] AT, moist mucus membranes.  Trachea midline, no masses. Cardiovascular: No clubbing, cyanosis, or edema. Respiratory: Normal respiratory effort, no increased work of breathing. GI: Abdomen is soft, non tender, non distended, no abdominal masses. Liver and spleen not palpable.  No hernias appreciated.  Stool sample for occult testing is not indicated.   GU: No CVA tenderness.  No bladder fullness or masses.   Skin: No rashes, bruises or suspicious lesions. Lymph: No cervical or inguinal adenopathy. Neurologic: Grossly intact, no focal deficits, moving all 4 extremities. Psychiatric: Normal mood and affect.  Laboratory Data: Lab Results  Component Value Date   WBC 6.0 04/21/2018   HGB 11.4 04/21/2018   HCT 34.7 04/21/2018   MCV 95 04/21/2018   PLT 251 04/21/2018    Lab Results  Component Value Date   CREATININE 0.93 04/21/2018    No results found for: PSA  No results found for: TESTOSTERONE  Lab Results  Component Value Date   HGBA1C 6.0 10/09/2017    Lab Results  Component Value Date   TSH 0.72 10/09/2017       Component Value Date/Time   CHOL 163 02/26/2018 0838   HDL 78.20 02/26/2018 0838   CHOLHDL 2 02/26/2018 0838   VLDL 10.6  02/26/2018 0838   LDLCALC 74 02/26/2018 0838    Lab Results  Component Value Date   AST 18 10/09/2017   Lab Results  Component Value Date   ALT 12 10/09/2017   No components found for: ALKALINEPHOPHATASE No components found for: BILIRUBINTOTAL  No results found for: ESTRADIOL  Urinalysis Negative.  See Epic and HPI. I have reviewed the labs.   Pertinent Imaging: CLINICAL DATA:  Flank pain for 1 week with history of kidney stones.  EXAM: CT ABDOMEN AND PELVIS WITHOUT CONTRAST  TECHNIQUE: Multidetector CT imaging of the abdomen and pelvis was performed following the standard protocol without IV contrast.  COMPARISON:  01/05/2017.  FINDINGS: Lower chest: Trace bilateral pleural fluid. Lung bases are otherwise clear. Heart size within normal limits. No pericardial or pleural effusion.  Hepatobiliary: Liver measures 18.7 cm but is otherwise unremarkable. Cholecystectomy. No biliary ductal dilatation.  Pancreas: Negative.  Spleen: Negative.  Adrenals/Urinary Tract: Adrenal glands are unremarkable. 1.2 cm low-attenuation lesion in the right kidney is unchanged. Small stones in the right kidney. Left kidney is unremarkable. Ureters are decompressed. Bladder is grossly unremarkable.  Stomach/Bowel: Gastric bypass surgery. Small hiatal hernia. Stomach, small bowel appendix and colon are otherwise unremarkable.  Vascular/Lymphatic: Vascular structures are unremarkable. No pathologically enlarged lymph nodes.  Reproductive: Hysterectomy. 3.0 cm low-attenuation lesion in the left adnexa is likely a physiologic ovarian cyst.  Other: Small pelvic free fluid. Mesenteries and peritoneum are unremarkable.  Musculoskeletal: No worrisome lytic or sclerotic lesions.  IMPRESSION: 1. Right renal stones.  No obstruction. 2. Trace bilateral pleural fluid. 3. Mild hepatomegaly.   Electronically Signed   By: Lorin Picket M.D.   On: 04/21/2018 13:03      I have independently reviewed the films.    Assessment & Plan:    1. History of nephrolithiasis Small stones on the right  2. Left flank pain No left ureteral or kidney stones  3. Microscopic hematuria UA today is negative  Return if symptoms worsen or fail to improve.  These notes generated with voice recognition software. I apologize for typographical errors.  Royden Purl  [place]  [address]  [place], [place] [address] [phone]

## 2018-04-22 LAB — BASIC METABOLIC PANEL
BUN/Creatinine Ratio: 14 (ref 9–23)
BUN: 13 mg/dL (ref 6–24)
CALCIUM: 8.6 mg/dL — AB (ref 8.7–10.2)
CO2: 24 mmol/L (ref 20–29)
CREATININE: 0.93 mg/dL (ref 0.57–1.00)
Chloride: 105 mmol/L (ref 96–106)
GFR calc Af Amer: 85 mL/min/{1.73_m2} (ref >59)
GFR, EST NON AFRICAN AMERICAN: 74 mL/min/{1.73_m2} (ref >59)
Glucose: 83 mg/dL (ref 65–99)
Potassium: 4.3 mmol/L (ref 3.5–5.2)
Sodium: 140 mmol/L (ref 134–144)

## 2018-04-22 LAB — CBC WITH DIFFERENTIAL/PLATELET
BASOS: 0 %
Basophils Absolute: 0 10*3/uL (ref 0.0–0.2)
EOS (ABSOLUTE): 0.1 10*3/uL (ref 0.0–0.4)
EOS: 1 %
HEMATOCRIT: 34.7 % (ref 34.0–46.6)
Hemoglobin: 11.4 g/dL (ref 11.1–15.9)
IMMATURE GRANS (ABS): 0 10*3/uL (ref 0.0–0.1)
IMMATURE GRANULOCYTES: 0 %
LYMPHS: 29 %
Lymphocytes Absolute: 1.7 10*3/uL (ref 0.7–3.1)
MCH: 31.1 pg (ref 26.6–33.0)
MCHC: 32.9 g/dL (ref 31.5–35.7)
MCV: 95 fL (ref 79–97)
MONOCYTES: 10 %
Monocytes Absolute: 0.6 10*3/uL (ref 0.1–0.9)
NEUTROS PCT: 60 %
Neutrophils Absolute: 3.6 10*3/uL (ref 1.4–7.0)
PLATELETS: 251 10*3/uL (ref 150–450)
RBC: 3.66 x10E6/uL — ABNORMAL LOW (ref 3.77–5.28)
RDW: 12.9 % (ref 12.3–15.4)
WBC: 6 10*3/uL (ref 3.4–10.8)

## 2018-04-23 ENCOUNTER — Telehealth: Payer: Self-pay | Admitting: Family Medicine

## 2018-04-23 LAB — CULTURE, URINE COMPREHENSIVE

## 2018-04-23 NOTE — Telephone Encounter (Signed)
Patient notified and will call her GYN.

## 2018-04-23 NOTE — Telephone Encounter (Signed)
-----   Message from Nori Riis, PA-C sent at 04/21/2018  1:20 PM EDT ----- Would you please let Valerie Colon know that there were no stones on her left, but she did have an ovarian cyst?   If she has a gynecologist, she should follow up with the gynecologist.  It's a normal cyst, but it can cause pain.  She can also see her PCP for this.  She does have stones on the right.  I suggest she have a 24 urine workup.

## 2018-04-27 ENCOUNTER — Encounter: Payer: Self-pay | Admitting: Urology

## 2018-04-29 DIAGNOSIS — N760 Acute vaginitis: Secondary | ICD-10-CM | POA: Diagnosis not present

## 2018-04-29 DIAGNOSIS — R102 Pelvic and perineal pain: Secondary | ICD-10-CM | POA: Diagnosis not present

## 2018-04-29 DIAGNOSIS — B9689 Other specified bacterial agents as the cause of diseases classified elsewhere: Secondary | ICD-10-CM | POA: Diagnosis not present

## 2018-05-03 DIAGNOSIS — J301 Allergic rhinitis due to pollen: Secondary | ICD-10-CM | POA: Diagnosis not present

## 2018-05-07 ENCOUNTER — Ambulatory Visit: Payer: BLUE CROSS/BLUE SHIELD | Admitting: Internal Medicine

## 2018-05-07 ENCOUNTER — Encounter: Payer: Self-pay | Admitting: Internal Medicine

## 2018-05-07 VITALS — BP 128/70 | HR 67 | Temp 98.4°F | Ht 66.0 in | Wt 181.0 lb

## 2018-05-07 DIAGNOSIS — Z23 Encounter for immunization: Secondary | ICD-10-CM

## 2018-05-07 DIAGNOSIS — N83202 Unspecified ovarian cyst, left side: Secondary | ICD-10-CM | POA: Diagnosis not present

## 2018-05-07 DIAGNOSIS — N200 Calculus of kidney: Secondary | ICD-10-CM

## 2018-05-07 DIAGNOSIS — F419 Anxiety disorder, unspecified: Secondary | ICD-10-CM | POA: Diagnosis not present

## 2018-05-07 DIAGNOSIS — Z8262 Family history of osteoporosis: Secondary | ICD-10-CM | POA: Insufficient documentation

## 2018-05-07 HISTORY — DX: Family history of osteoporosis: Z82.62

## 2018-05-07 NOTE — Progress Notes (Signed)
Pre visit review using our clinic review tool, if applicable. No additional management support is needed unless otherwise documented below in the visit note. 

## 2018-05-07 NOTE — Patient Instructions (Addendum)
rec MMR vaccine health department call to schedule appt [phone]    Ref. Range 02/26/2018 08:38  Rubella Latest Units: index 1.28  Mumps IgG Latest Units: AU/mL 80.10  Rubeola IgG Latest Units: AU/mL <25.00 (L)   Vitamin D3 5000 IU daily total  Calcium 600 mg 2x per day    Ovarian Cyst An ovarian cyst is a fluid-filled sac that forms on an ovary. The ovaries are small organs that produce eggs in women. Various types of cysts can form on the ovaries. Some may cause symptoms and require treatment. Most ovarian cysts go away on their own, are not cancerous (are benign), and do not cause problems. Common types of ovarian cysts include:  Functional (follicle) cysts. ? Occur during the menstrual cycle, and usually go away with the next menstrual cycle if you do not get pregnant. ? Usually cause no symptoms.  Endometriomas. ? Are cysts that form from the tissue that lines the uterus (endometrium). ? Are sometimes called "chocolate cysts" because they become filled with blood that turns brown. ? Can cause pain in the lower abdomen during intercourse and during your period.  Cystadenoma cysts. ? Develop from cells on the outside surface of the ovary. ? Can get very large and cause lower abdomen pain and pain with intercourse. ? Can cause severe pain if they twist or break open (rupture).  Dermoid cysts. ? Are sometimes found in both ovaries. ? May contain different kinds of body tissue, such as skin, teeth, hair, or cartilage. ? Usually do not cause symptoms unless they get very big.  Theca lutein cysts. ? Occur when too much of a certain hormone (human chorionic gonadotropin) is produced and overstimulates the ovaries to produce an egg. ? Are most common after having procedures used to assist with the conception of a baby (in vitro fertilization).  What are the causes? Ovarian cysts may be caused by:  Ovarian hyperstimulation syndrome. This is a condition that can develop from taking  fertility medicines. It causes multiple large ovarian cysts to form.  Polycystic ovarian syndrome (PCOS). This is a common hormonal disorder that can cause ovarian cysts, as well as problems with your period or fertility.  What increases the risk? The following factors may make you more likely to develop ovarian cysts:  Being overweight or obese.  Taking fertility medicines.  Taking certain forms of hormonal birth control.  Smoking.  What are the signs or symptoms? Many ovarian cysts do not cause symptoms. If symptoms are present, they may include:  Pelvic pain or pressure.  Pain in the lower abdomen.  Pain during sex.  Abdominal swelling.  Abnormal menstrual periods.  Increasing pain with menstrual periods.  How is this diagnosed? These cysts are commonly found during a routine pelvic exam. You may have tests to find out more about the cyst, such as:  Ultrasound.  X-ray of the pelvis.  CT scan.  MRI.  Blood tests.  How is this treated? Many ovarian cysts go away on their own without treatment. Your health care provider may want to check your cyst regularly for 2-3 months to see if it changes. If you are in menopause, it is especially important to have your cyst monitored closely because menopausal women have a higher rate of ovarian cancer. When treatment is needed, it may include:  Medicines to help relieve pain.  A procedure to drain the cyst (aspiration).  Surgery to remove the whole cyst.  Hormone treatment or birth control pills. These methods are  sometimes used to help dissolve a cyst.  Follow these instructions at home:  Take over-the-counter and prescription medicines only as told by your health care provider.  Do not drive or use heavy machinery while taking prescription pain medicine.  Get regular pelvic exams and Pap tests as often as told by your health care provider.  Return to your normal activities as told by your health care provider.  Ask your health care provider what activities are safe for you.  Do not use any products that contain nicotine or tobacco, such as cigarettes and e-cigarettes. If you need help quitting, ask your health care provider.  Keep all follow-up visits as told by your health care provider. This is important. Contact a health care provider if:  Your periods are late, irregular, or painful, or they stop.  You have pelvic pain that does not go away.  You have pressure on your bladder or trouble emptying your bladder completely.  You have pain during sex.  You have any of the following in your abdomen: ? A feeling of fullness. ? Pressure. ? Discomfort. ? Pain that does not go away. ? Swelling.  You feel generally ill.  You become constipated.  You lose your appetite.  You develop severe acne.  You start to have more body hair and facial hair.  You are gaining weight or losing weight without changing your exercise and eating habits.  You think you may be pregnant. Get help right away if:  You have abdominal pain that is severe or gets worse.  You cannot eat or drink without vomiting.  You suddenly develop a fever.  Your menstrual period is much heavier than usual. This information is not intended to replace advice given to you by your health care provider. Make sure you discuss any questions you have with your health care provider. Document Released: 08/04/2005 Document Revised: 02/22/2016 Document Reviewed: 01/06/2016 Elsevier Interactive Patient Education  [employer].

## 2018-05-07 NOTE — Progress Notes (Signed)
Chief Complaint  Patient presents with  . Follow-up   F/u  1. Kidney stone right seeing [facility] no intervention as now  2. L ovarian cyst 3-3.62 cm on seasonale per [facility] ob/gyn due to f/u 06/29/18 repeat US  3. Migraines controlled  4. Anxiety improved off ativan 0.5 prn not taking zoloft 50 mg qd working out 5-7x per week and helping  5.. FH osteoporosis mom and m aunts x 2   Review of Systems  Constitutional: Negative for weight loss.  HENT: Negative for hearing loss.   Eyes: Negative for blurred vision.  Respiratory: Negative for shortness of breath.   Cardiovascular: Negative for chest pain.  Gastrointestinal: Negative for abdominal pain.  Skin: Negative for rash.  Neurological: Negative for headaches.  Psychiatric/Behavioral: Negative for depression. The patient is not nervous/anxious.    Past Medical History:  Diagnosis Date  . Acid reflux   . Allergic rhinitis   . Allergy   . Anxiety   . Heart murmur   . History of hiatal hernia   . Kidney stones   . Migraine    Past Surgical History:  Procedure Laterality Date  . ABDOMINAL HYSTERECTOMY     2008  . BREAST BIOPSY Right 12/19/2013   stereo right- neg  . CHOLECYSTECTOMY    . COLONOSCOPY WITH PROPOFOL N/A 05/05/2016   Procedure: COLONOSCOPY WITH PROPOFOL;  Surgeon: Lollie Sails, MD;  Location[facility] ENDOSCOPY;  Service: Endoscopy;  Laterality: N/A;  . ESOPHAGOGASTRODUODENOSCOPY (EGD) WITH PROPOFOL N/A 05/05/2016   Procedure: ESOPHAGOGASTRODUODENOSCOPY (EGD) WITH PROPOFOL;  Surgeon: Lollie Sails, MD;  Location: [facility] ENDOSCOPY;  Service: Endoscopy;  Laterality: N/A;  . EXTRACORPOREAL SHOCK WAVE LITHOTRIPSY Left 11/06/2016   Procedure: EXTRACORPOREAL SHOCK WAVE LITHOTRIPSY (ESWL);  Surgeon: Nickie Retort, MD;  Location: [facility] ORS;  Service: Urology;  Laterality: Left;  . gallbladder  2012  . GASTRIC BYPASS  2012  . REDUCTION MAMMAPLASTY Bilateral    2010   Family History  Problem Relation Age of  Onset  . Prostate cancer Father   . Hyperlipidemia Father   . Hypertension Father   . Depression Father   . Kidney Stones Maternal Grandfather   . Stroke Maternal Grandfather   . Cancer Maternal Grandfather        prostate cancer   . Diabetes Mother   . Hyperlipidemia Mother   . Hypertension Mother   . Heart disease Maternal Grandmother   . Cancer Other        breast cancer   . Breast cancer Other   . Kidney disease Neg Hx   . Bladder Cancer Neg Hx    Social History   Socioeconomic History  . Marital status: Married    Spouse name: Not on file  . Number of children: Not on file  . Years of education: Not on file  . Highest education level: Not on file  Occupational History  . Not on file  Social Needs  . Financial resource strain: Not on file  . Food insecurity:    Worry: Not on file    Inability: Not on file  . Transportation needs:    Medical: Not on file    Non-medical: Not on file  Tobacco Use  . Smoking status: Never Smoker  . Smokeless tobacco: Never Used  Substance and Sexual Activity  . Alcohol use: No    Alcohol/week: 0.0 standard drinks  . Drug use: No  . Sexual activity: Not on file  Lifestyle  . Physical  activity:    Days per week: Not on file    Minutes per session: Not on file  . Stress: Not on file  Relationships  . Social connections:    Talks on phone: Not on file    Gets together: Not on file    Attends religious service: Not on file    Active member of club or organization: Not on file    Attends meetings of clubs or organizations: Not on file    Relationship status: Not on file  . Intimate partner violence:    Fear of current or ex partner: Not on file    Emotionally abused: Not on file    Physically abused: Not on file    Forced sexual activity: Not on file  Other Topics Concern  . 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 blood glucose meter kit and supplies KIT Dispense based on patient and insurance preference. Use up to four times daily as directed. (FOR ICD-9 250.00, 250.01).  . cetirizine (ZYRTEC) 10 MG tablet Take 10 mg by mouth daily.  . Cyanocobalamin (RA VITAMIN B-12 TR) 1000 MCG TBCR Take by mouth.  . ferrous sulfate 325 (65 FE) MG EC tablet Take 325 mg by mouth 3 (three) times daily with meals.  . fluticasone (FLONASE) 50 MCG/ACT nasal spray Place 1 spray into both nostrils daily.  . Glucose Blood (BLOOD GLUCOSE TEST STRIPS) STRP 1 Device by In Vitro route 2 (two) times daily.  Marland Kitchen HYDROcodone-acetaminophen (NORCO/VICODIN) 5-325 MG tablet TK 1 TO 2 TS PO Q 4-6 H PRF POST-OP PAIN  . ketorolac (TORADOL) 10 MG tablet Take 1 tablet (10 mg total) by mouth every 8 (eight) hours as needed for severe pain.  . Lancets MISC 1 Device by Does not apply route 2 (two) times daily.  Marland Kitchen levonorgestrel-ethinyl estradiol (SEASONALE,INTROVALE,JOLESSA) 0.15-0.03 MG tablet Take by mouth.  Marland Kitchen LORazepam (ATIVAN) 0.5 MG tablet Take 1 tablet (0.5 mg total) by mouth at bedtime as needed for anxiety.  . Magnesium 400 MG CAPS Take by mouth.  . Multiple Vitamin (MULTIVITAMIN) tablet Take 1 tablet by mouth daily.  . ondansetron (ZOFRAN) 4 MG tablet Take 1 tablet (4 mg total) by mouth every 8 (eight) hours as needed for nausea or vomiting.  . pantoprazole (PROTONIX) 20 MG tablet Take 1 tablet (20 mg total) by mouth daily. Before food 30 minutes (Patient taking differently: Take 40 mg by mouth daily. Before food 30 minutes)  . sertraline (ZOLOFT) 50 MG tablet Take 1 tablet (50 mg total) by mouth daily. In am  . sucralfate (CARAFATE) 1 g tablet Take 1 g by mouth 2 (two) times daily.  . SUMAtriptan (IMITREX) 100 MG tablet TK 1 T PO ONCE PRF MIGRAINE. MAY TAKE A SECOND DOSE AFTER 2 H IF NEEDED  . tamsulosin (FLOMAX) 0.4 MG CAPS capsule Take 1  capsule (0.4 mg total) by mouth daily.   No Known Allergies Recent Results (from the past 2160 hour(s))  Cortisol     Status: None   Collection Time: 02/26/18  8:38 AM  Result Value Ref Range   Cortisol, Plasma 7.2 ug/dL    Comment: AM:  4.3 - 22.4 ug/dLPM:  3.1 - 16.7 ug/dL  Lipid panel     Status: None   Collection Time: 02/26/18  8:38 AM  Result Value Ref Range   Cholesterol 163 0 - 200 mg/dL    Comment: ATP III Classification       Desirable:  < 200 mg/dL               Borderline High:  200 - 239 mg/dL          High:  > = 240 mg/dL   Triglycerides 53.0 0.0 - 149.0 mg/dL    Comment: Normal:  <150 mg/dLBorderline High:  150 - 199 mg/dL   HDL 78.20 >39.00 mg/dL   VLDL 10.6 0.0 - 40.0 mg/dL   LDL Cholesterol 74 0 - 99 mg/dL   Total CHOL/HDL Ratio 2     Comment:                Men          Women1/2 Average Risk     3.4          3.3Average Risk          5.0          4.42X Average Risk          9.6          7.13X Average Risk          15.0          11.0                       NonHDL 84.87     Comment: NOTE:  Non-HDL goal should be 30 mg/dL higher than patient's LDL goal (i.e. LDL goal of < 70 mg/dL, would have non-HDL goal of < 100 mg/dL)  Measles/Mumps/Rubella Immunity     Status: Abnormal   Collection Time: 02/26/18  8:38 AM  Result Value Ref Range   Rubeola IgG <25.00 (L) AU/mL    Comment: AU/mL            Interpretation -----            -------------- <25.00           Negative 25.00-29.99      Equivocal >29.99           Positive . A positive result indicates that the patient has antibody to measles virus. It does not differentiate  between an active or past infection. The clinical  diagnosis must be interpreted in conjunction with  clinical signs and symptoms of the patient.    Mumps IgG 80.10 AU/mL    Comment:  AU/mL           Interpretation -------         ---------------- <9.00             Negative 9.00-10.99        Equivocal >10.99            Positive A positive  result indicates that the patient has  antibody to mumps virus. It does not differentiate between an  active or past infection. The clinical diagnosis must be interpreted in conjunction with clinical signs and symptoms of the patient. .    Rubella 1.28 index    Comment:     Index            Interpretation     -----            --------------       <  0.90            Not consistent with Immunity     0.90-0.99        Equivocal     > or = 1.00      Consistent with Immunity  . The presence of rubella IgG antibody suggests  immunization or past or current infection with rubella virus.   Urinalysis, Complete w Microscopic     Status: Abnormal   Collection Time: 04/12/18  7:23 AM  Result Value Ref Range   Color, Urine YELLOW (A) YELLOW   APPearance HAZY (A) CLEAR   Specific Gravity, Urine 1.026 1.005 - 1.030   pH 5.0 5.0 - 8.0   Glucose, UA NEGATIVE NEGATIVE mg/dL   Hgb urine dipstick NEGATIVE NEGATIVE   Bilirubin Urine NEGATIVE NEGATIVE   Ketones, ur NEGATIVE NEGATIVE mg/dL   Protein, ur NEGATIVE NEGATIVE mg/dL   Nitrite NEGATIVE NEGATIVE   Leukocytes, UA NEGATIVE NEGATIVE   RBC / HPF 6-10 0 - 5 RBC/hpf   WBC, UA 0-5 0 - 5 WBC/hpf   Bacteria, UA NONE SEEN NONE SEEN   Squamous Epithelial / LPF 0-5 0 - 5   Mucus PRESENT     Comment: Performed at [facility], [address] [place], [place] [address]  CBC with Differential     Status: Abnormal   Collection Time: 04/12/18  7:23 AM  Result Value Ref Range   WBC 5.2 3.6 - 11.0 K/uL   RBC 3.65 (L) 3.80 - 5.20 MIL/uL   Hemoglobin 11.9 (L) 12.0 - 16.0 g/dL   HCT 35.2 35.0 - 47.0 %   MCV 96.6 80.0 - 100.0 fL   MCH 32.7 26.0 - 34.0 pg   MCHC 33.8 32.0 - 36.0 g/dL   RDW 14.0 11.5 - 14.5 %   Platelets 270 150 - 440 K/uL   Neutrophils Relative % 52 %   Neutro Abs 2.7 1.4 - 6.5 K/uL   Lymphocytes Relative 36 %   Lymphs Abs 1.9 1.0 - 3.6 K/uL   Monocytes Relative 10 %   Monocytes Absolute 0.5 0.2 - 0.9 K/uL   Eosinophils  Relative 2 %   Eosinophils Absolute 0.1 0 - 0.7 K/uL   Basophils Relative 0 %   Basophils Absolute 0.0 0 - 0.1 K/uL    Comment: Performed at [facility], [address] [place], [place] [address]  Basic metabolic panel     Status: Abnormal   Collection Time: 04/12/18  7:23 AM  Result Value Ref Range   Sodium 140 135 - 145 mmol/L   Potassium 3.8 3.5 - 5.1 mmol/L   Chloride 107 98 - 111 mmol/L   CO2 26 22 - 32 mmol/L   Glucose, Bld 94 70 - 99 mg/dL   BUN 17 6 - 20 mg/dL   Creatinine, Ser 0.82 0.44 - 1.00 mg/dL   Calcium 8.3 (L) 8.9 - 10.3 mg/dL   GFR calc non Af Amer >60 >60 mL/min   GFR calc Af Amer >60 >60 mL/min    Comment: (NOTE) The eGFR has been calculated using the CKD EPI equation. This calculation has not been validated in all clinical situations. eGFR's persistently <60 mL/min signify possible Chronic Kidney Disease.    Anion gap 7 5 - 15    Comment: Performed at [facility], [address] [place], [place] [address]  Urinalysis, Complete     Status: None   Collection Time: 04/21/18 10:44 AM  Result Value Ref Range  Specific Gravity, UA 1.025 1.005 - 1.030   pH, UA 5.5 5.0 - 7.5   Color, UA Yellow Yellow   Appearance Ur Clear Clear   Leukocytes, UA Negative Negative   Protein, UA Negative Negative/Trace   Glucose, UA Negative Negative   Ketones, UA Negative Negative   RBC, UA Negative Negative   Bilirubin, UA Negative Negative   Urobilinogen, Ur 0.2 0.2 - 1.0 mg/dL   Nitrite, UA Negative Negative  Basic metabolic panel     Status: Abnormal   Collection Time: 04/21/18 10:45 AM  Result Value Ref Range   Glucose 83 65 - 99 mg/dL   BUN 13 6 - 24 mg/dL   Creatinine, Ser 0.93 0.57 - 1.00 mg/dL   GFR calc non Af Amer 74 >59 mL/min/1.73   GFR calc Af Amer 85 >59 mL/min/1.73   BUN/Creatinine Ratio 14 9 - 23   Sodium 140 134 - 144 mmol/L   Potassium 4.3 3.5 - 5.2 mmol/L   Chloride 105 96 - 106 mmol/L   CO2 24 20 - 29 mmol/L   Calcium 8.6  (L) 8.7 - 10.2 mg/dL  CBC with Differential/Platelet     Status: Abnormal   Collection Time: 04/21/18 10:45 AM  Result Value Ref Range   WBC 6.0 3.4 - 10.8 x10E3/uL   RBC 3.66 (L) 3.77 - 5.28 x10E6/uL   Hemoglobin 11.4 11.1 - 15.9 g/dL   Hematocrit 34.7 34.0 - 46.6 %   MCV 95 79 - 97 fL   MCH 31.1 26.6 - 33.0 pg   MCHC 32.9 31.5 - 35.7 g/dL   RDW 12.9 12.3 - 15.4 %   Platelets 251 150 - 450 x10E3/uL   Neutrophils 60 Not Estab. %   Lymphs 29 Not Estab. %   Monocytes 10 Not Estab. %   Eos 1 Not Estab. %   Basos 0 Not Estab. %   Neutrophils Absolute 3.6 1.4 - 7.0 x10E3/uL   Lymphocytes Absolute 1.7 0.7 - 3.1 x10E3/uL   Monocytes Absolute 0.6 0.1 - 0.9 x10E3/uL   EOS (ABSOLUTE) 0.1 0.0 - 0.4 x10E3/uL   Basophils Absolute 0.0 0.0 - 0.2 x10E3/uL   Immature Granulocytes 0 Not Estab. %   Immature Grans (Abs) 0.0 0.0 - 0.1 x10E3/uL  CULTURE, URINE COMPREHENSIVE     Status: None   Collection Time: 04/21/18 11:05 AM  Result Value Ref Range   Urine Culture, Comprehensive Final report    Organism ID, Bacteria Comment     Comment: Mixed urogenital flora 1,000 Colonies/mL    Objective  Body mass index is 29.21 kg/m. Wt Readings from Last 3 Encounters:  05/07/18 181 lb (82.1 kg)  04/21/18 191 lb (86.6 kg)  04/12/18 180 lb (81.6 kg)   Temp Readings from Last 3 Encounters:  05/07/18 98.4 F (36.9 C) (Oral)  04/12/18 97.9 F (36.6 C) (Oral)  02/12/18 98.3 F (36.8 C) (Oral)   BP Readings from Last 3 Encounters:  05/07/18 128/70  04/21/18 116/77  04/12/18 105/66   Pulse Readings from Last 3 Encounters:  05/07/18 67  04/21/18 77  04/12/18 (!) 53    Physical Exam  Constitutional: She is oriented to person, place, and time. Vital signs are normal. She appears well-developed and well-nourished. She is cooperative.  HENT:  Head: Normocephalic and atraumatic.  Mouth/Throat: Oropharynx is clear and moist and mucous membranes are normal.  Eyes: Pupils are equal, round, and  reactive to light. Conjunctivae are normal.  Cardiovascular: Normal rate, regular rhythm and normal  heart sounds.  Pulmonary/Chest: Effort normal and breath sounds normal.  Neurological: She is alert and oriented to person, place, and time. Gait normal.  Skin: Skin is warm, dry and intact.  Psychiatric: She has a normal mood and affect. Her speech is normal and behavior is normal. Judgment and thought content normal.  Nursing note and vitals reviewed.   Assessment   1. Kidney stone, right  2. Left ovarian cyst  3. Anxiety improved 4. FH osteoporosis  5. HM Plan   1. F/u [facility]  2. F/u [facility] 06/29/18 On seasonale  3. Off ativan 0.5 and zoloft 50 doing well  4 consider dexa in future  rec D3 5000 IU qd and calcium 600 mg bid  5. Flu shot given today  Tdap had 01/24/16 and 01/16/18  Hep B immune. rec mmr vaccine  Consider check HIV in future  S/p hysterectomyonly left with right ovary f/u OB/GYN f/u mammo benign findings 03/2018  Colonoscopy had 04/2016 [facility] repeat rec in 5 years [facility] Consider DEXA FH osteoporosis   Provider: Dr. Olivia Mackie McLean-Scocuzza-[facility]

## 2018-06-11 ENCOUNTER — Encounter: Payer: Self-pay | Admitting: Internal Medicine

## 2018-06-11 DIAGNOSIS — Z23 Encounter for immunization: Secondary | ICD-10-CM | POA: Diagnosis not present

## 2018-06-15 DIAGNOSIS — S161XXA Strain of muscle, fascia and tendon at neck level, initial encounter: Secondary | ICD-10-CM | POA: Diagnosis not present

## 2018-06-21 DIAGNOSIS — M25531 Pain in right wrist: Secondary | ICD-10-CM | POA: Diagnosis not present

## 2018-06-21 DIAGNOSIS — S63501A Unspecified sprain of right wrist, initial encounter: Secondary | ICD-10-CM | POA: Diagnosis not present

## 2018-06-29 DIAGNOSIS — N83202 Unspecified ovarian cyst, left side: Secondary | ICD-10-CM | POA: Diagnosis not present

## 2018-06-29 DIAGNOSIS — R102 Pelvic and perineal pain: Secondary | ICD-10-CM | POA: Diagnosis not present

## 2018-07-09 ENCOUNTER — Encounter: Payer: Self-pay | Admitting: Internal Medicine

## 2018-07-23 DIAGNOSIS — J301 Allergic rhinitis due to pollen: Secondary | ICD-10-CM | POA: Diagnosis not present

## 2018-07-29 DIAGNOSIS — J301 Allergic rhinitis due to pollen: Secondary | ICD-10-CM | POA: Diagnosis not present

## 2018-09-10 ENCOUNTER — Ambulatory Visit: Payer: BLUE CROSS/BLUE SHIELD | Admitting: Internal Medicine

## 2018-09-28 DIAGNOSIS — R1031 Right lower quadrant pain: Secondary | ICD-10-CM | POA: Diagnosis not present

## 2018-09-30 ENCOUNTER — Encounter: Payer: Self-pay | Admitting: Emergency Medicine

## 2018-09-30 ENCOUNTER — Emergency Department
Admission: EM | Admit: 2018-09-30 | Discharge: 2018-09-30 | Disposition: A | Discharge status: Home / Self Care | Payer: BLUE CROSS/BLUE SHIELD | Attending: Emergency Medicine | Admitting: Emergency Medicine

## 2018-09-30 ENCOUNTER — Other Ambulatory Visit: Payer: Self-pay

## 2018-09-30 ENCOUNTER — Emergency Department: Payer: BLUE CROSS/BLUE SHIELD

## 2018-09-30 DIAGNOSIS — Z79899 Other long term (current) drug therapy: Secondary | ICD-10-CM | POA: Diagnosis not present

## 2018-09-30 DIAGNOSIS — R1031 Right lower quadrant pain: Secondary | ICD-10-CM | POA: Diagnosis not present

## 2018-09-30 DIAGNOSIS — N200 Calculus of kidney: Secondary | ICD-10-CM | POA: Diagnosis not present

## 2018-09-30 DIAGNOSIS — N390 Urinary tract infection, site not specified: Secondary | ICD-10-CM

## 2018-09-30 LAB — CBC
HCT: 36.7 % (ref 36.0–46.0)
HEMOGLOBIN: 11.9 g/dL — AB (ref 12.0–15.0)
MCH: 31.2 pg (ref 26.0–34.0)
MCHC: 32.4 g/dL (ref 30.0–36.0)
MCV: 96.1 fL (ref 80.0–100.0)
NRBC: 0 % (ref 0.0–0.2)
Platelets: 261 10*3/uL (ref 150–400)
RBC: 3.82 MIL/uL — AB (ref 3.87–5.11)
RDW: 13.2 % (ref 11.5–15.5)
WBC: 6 10*3/uL (ref 4.0–10.5)

## 2018-09-30 LAB — COMPREHENSIVE METABOLIC PANEL
ALBUMIN: 3.2 g/dL — AB (ref 3.5–5.0)
ALK PHOS: 32 U/L — AB (ref 38–126)
ALT: 20 U/L (ref 0–44)
ANION GAP: 3 — AB (ref 5–15)
AST: 20 U/L (ref 15–41)
BUN: 18 mg/dL (ref 6–20)
CALCIUM: 8.6 mg/dL — AB (ref 8.9–10.3)
CO2: 25 mmol/L (ref 22–32)
Chloride: 109 mmol/L (ref 98–111)
Creatinine, Ser: 0.84 mg/dL (ref 0.44–1.00)
GFR calc Af Amer: >60 mL/min (ref >60)
GFR calc non Af Amer: >60 mL/min (ref >60)
GLUCOSE: 114 mg/dL — AB (ref 70–99)
Potassium: 3.9 mmol/L (ref 3.5–5.1)
SODIUM: 137 mmol/L (ref 135–145)
Total Bilirubin: 0.8 mg/dL (ref 0.3–1.2)
Total Protein: 6.7 g/dL (ref 6.5–8.1)

## 2018-09-30 LAB — URINALYSIS, COMPLETE (UACMP) WITH MICROSCOPIC
BILIRUBIN URINE: NEGATIVE
Glucose, UA: NEGATIVE mg/dL
HGB URINE DIPSTICK: NEGATIVE
KETONES UR: NEGATIVE mg/dL
Leukocytes,Ua: NEGATIVE
NITRITE: NEGATIVE
PROTEIN: NEGATIVE mg/dL
Specific Gravity, Urine: 1.017 (ref 1.005–1.030)
pH: 5 (ref 5.0–8.0)

## 2018-09-30 LAB — POCT PREGNANCY, URINE: PREG TEST UR: NEGATIVE

## 2018-09-30 LAB — LIPASE, BLOOD: Lipase: 45 U/L (ref 11–51)

## 2018-09-30 MED ORDER — FOSFOMYCIN TROMETHAMINE 3 G PO PACK
3.0000 g | PACK | Freq: Once | ORAL | Status: AC
  Administered 2018-09-30: 3 g via ORAL
  Filled 2018-09-30: qty 3

## 2018-09-30 MED ORDER — KETOROLAC TROMETHAMINE 30 MG/ML IJ SOLN
30.0000 mg | Freq: Once | INTRAMUSCULAR | Status: AC
  Administered 2018-09-30: 30 mg via INTRAVENOUS
  Filled 2018-09-30: qty 1

## 2018-09-30 MED ORDER — SODIUM CHLORIDE 0.9 % IV BOLUS
1000.0000 mL | Freq: Once | INTRAVENOUS | Status: AC
  Administered 2018-09-30: 1000 mL via INTRAVENOUS

## 2018-09-30 NOTE — ED Notes (Signed)
First Nurse Note: Patient complaining of RLQ pain, states it feels like a "kidney stone", states she has had several "over the years".

## 2018-09-30 NOTE — ED Triage Notes (Signed)
Pt here with c/o right sided flank and lower abd pain that started this am, states she woke up with a "burning sensation" RLQ and pain down her right thigh. Does not have her right ovary, does have appendix, has hx of kidney stones, NAD.

## 2018-09-30 NOTE — ED Notes (Signed)
Pt presents to ED with c/o sudden onset R flank pain that started this morning. Pt denies fever, N/V, states diarrhea last night, pt denies vaginal bleeding and discharge. Pt states hx of kidney stones with lithotripsy. Pt with RLQ abdominal tenderness. Pt is alert and oriented at this time. Skin warm, dry, and intact.

## 2018-09-30 NOTE — ED Provider Notes (Signed)
[facility] Emergency Department Provider Note  Time seen: 7:32 AM  I have reviewed the triage vital signs and the nursing notes.   HISTORY  Chief Complaint Flank Pain    HPI Valerie Colon is a 48 y.o. female with a past medical history of anxiety, kidney stones, ovarian cyst, presents to the emergency department for right-sided abdominal pain.  According to the patient since early this morning she has been experiencing pain in her right flank.  Patient states it is intermittent, mild dull pain and then occasionally moderate sharp pains.  Patient has a history of kidney stones which is feels somewhat similar per patient.  Denies any nausea, vomiting, fever.  Has had mild amount of loose stool overnight as well.  Patient is status post right oophorectomy, but has her appendix still.  Denies any hematuria or dysuria.   Past Medical History:  Diagnosis Date  . Acid reflux   . Allergic rhinitis   . Allergy   . Anxiety   . Heart murmur   . History of hiatal hernia   . Kidney stones   . Migraine   . Ovarian cyst, left     Patient Active Problem List   Diagnosis Date Noted  . Family history of osteoporosis 05/07/2018  . Cyst of left ovary 05/07/2018  . Cardiac murmur 02/12/2018  . Postprandial hypoglycemia 02/12/2018  . 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 Epigastric abdominal pain 11/13/2017  . Hypotension 11/13/2017  . Chronic gastritis 10/09/2017  . Migraine headache without aura 10/09/2017  . Soft tissue lesion of elbow region 10/09/2017  . Knee pain, bilateral 10/09/2017  . Shoulder pain, right 10/09/2017  . Pelvic pain 10/09/2017  . Breast calcifications 10/09/2017  . Increased frequency of headaches 10/02/2017  . Snoring 07/31/2017  . Sprain of ligament of tarsometatarsal joint 04/01/2016  . Nephrolithiasis 02/29/2016  . Chronic fatigue 01/24/2016  . Dizziness 01/24/2016  . Gastroesophageal reflux disease  without esophagitis 01/24/2016  . GAD (generalized anxiety disorder) 11/20/2015  . Emotional stress 11/09/2015  . Intermittent palpitations 11/09/2015  . Vitamin D deficiency 07/09/2015  . Allergic rhinitis 03/29/2015  . Chronic anemia 01/08/2015  . Prediabetes 01/08/2015  . Obstructive apnea 01/20/2014  . Hypoglycemia after GI (gastrointestinal) surgery 11/26/2012    Past Surgical History:  Procedure Laterality Date  . ABDOMINAL HYSTERECTOMY     2008  . BREAST BIOPSY Right 12/19/2013   stereo right- neg  . CHOLECYSTECTOMY    . COLONOSCOPY WITH PROPOFOL N/A 05/05/2016   Procedure: COLONOSCOPY WITH PROPOFOL;  Surgeon: Lollie Sails, MD;  Location: [facility] ENDOSCOPY;  Service: Endoscopy;  Laterality: N/A;  . ESOPHAGOGASTRODUODENOSCOPY (EGD) WITH PROPOFOL N/A 05/05/2016   Procedure: ESOPHAGOGASTRODUODENOSCOPY (EGD) WITH PROPOFOL;  Surgeon: Lollie Sails, MD;  Location: [facility] ENDOSCOPY;  Service: Endoscopy;  Laterality: N/A;  . EXTRACORPOREAL SHOCK WAVE LITHOTRIPSY Left 11/06/2016   Procedure: EXTRACORPOREAL SHOCK WAVE LITHOTRIPSY (ESWL);  Surgeon: Nickie Retort, MD;  Location: [facility] ORS;  Service: Urology;  Laterality: Left;  . gallbladder  2012  . GASTRIC BYPASS  2012  . REDUCTION MAMMAPLASTY Bilateral    2010    Prior to Admission medications   Medication Sig Start Date End Date Taking? Authorizing Provider  blood glucose meter kit and supplies KIT Dispense based on patient and insurance preference. Use up to four times daily as directed. (FOR ICD-9 250.00, 250.01). 02/12/18   McLean-Scocuzza, Nino Glow, MD  cetirizine (ZYRTEC) 10 MG tablet Take  10 mg by mouth daily.    [provider]  Cyanocobalamin (RA VITAMIN B-12 TR) 1000 MCG TBCR Take by mouth.    [provider]  ferrous sulfate 325 (65 FE) MG EC tablet Take 325 mg by mouth 3 (three) times daily with meals.    [provider]  fluticasone (FLONASE) 50 MCG/ACT nasal spray Place 1 spray into both  nostrils daily.    [provider]  Glucose Blood (BLOOD GLUCOSE TEST STRIPS) STRP 1 Device by In Vitro route 2 (two) times daily. 02/12/18   McLean-Scocuzza, Nino Glow, MD  HYDROcodone-acetaminophen (NORCO/VICODIN) 5-325 MG tablet TK 1 TO 2 TS PO Q 4-6 H PRF POST-OP PAIN 05/05/18   [provider]  ketorolac (TORADOL) 10 MG tablet Take 1 tablet (10 mg total) by mouth every 8 (eight) hours as needed for severe pain. 04/12/18   Nance Pear, MD  Lancets MISC 1 Device by Does not apply route 2 (two) times daily. 02/12/18   McLean-Scocuzza, Nino Glow, MD  levonorgestrel-ethinyl estradiol (SEASONALE,INTROVALE,JOLESSA) 0.15-0.03 MG tablet Take by mouth. 04/29/18   [provider]  Magnesium 400 MG CAPS Take by mouth.    [provider]  Multiple Vitamin (MULTIVITAMIN) tablet Take 1 tablet by mouth daily.    [provider]  ondansetron (ZOFRAN) 4 MG tablet Take 1 tablet (4 mg total) by mouth every 8 (eight) hours as needed for nausea or vomiting. 04/12/18   Nance Pear, MD  pantoprazole (PROTONIX) 20 MG tablet Take 1 tablet (20 mg total) by mouth daily. Before food 30 minutes Patient taking differently: Take 40 mg by mouth daily. Before food 30 minutes 10/09/17   McLean-Scocuzza, Nino Glow, MD  sucralfate (CARAFATE) 1 g tablet Take 1 g by mouth 2 (two) times daily.    [provider]  SUMAtriptan (IMITREX) 100 MG tablet TK 1 T PO ONCE PRF MIGRAINE. MAY TAKE A SECOND DOSE AFTER 2 H IF NEEDED 11/09/17   [provider]  tamsulosin (FLOMAX) 0.4 MG CAPS capsule Take 1 capsule (0.4 mg total) by mouth daily. 04/12/18   Nance Pear, MD    No Known Allergies  Family History  Problem Relation Age of Onset  . Prostate cancer Father   . Hyperlipidemia Father   . Hypertension Father   . Depression Father   . Kidney Stones Maternal Grandfather   . Stroke Maternal Grandfather   . Cancer Maternal Grandfather        prostate cancer   . Diabetes  Mother   . Hyperlipidemia Mother   . Hypertension Mother   . Heart disease Maternal Grandmother   . Cancer Other        breast cancer   . Breast cancer Other   . Kidney disease Neg Hx   . Bladder Cancer Neg Hx     Social History Social History   Tobacco Use  . Smoking status: Never Smoker  . Smokeless tobacco: Never Used  Substance Use Topics  . Alcohol use: No    Alcohol/week: 0.0 standard drinks  . Drug use: No    Review of Systems Constitutional: Negative for fever. Cardiovascular: Negative for chest pain. Respiratory: Negative for shortness of breath. Gastrointestinal: Right flank pain.  Positive for loose stool, negative for vomiting. Genitourinary: Negative for urinary compaints such as hematuria or dysuria. Musculoskeletal: Negative for musculoskeletal complaints Skin: Negative for skin complaints  Neurological: Negative for headache All other ROS negative  ____________________________________________   PHYSICAL EXAM:  VITAL SIGNS: ED  Triage Vitals  Enc Vitals Group     BP 09/30/18 0716 125/75     Pulse Rate 09/30/18 0716 88     Resp 09/30/18 0716 18     Temp 09/30/18 0716 98.1 F (36.7 C)     Temp Source 09/30/18 0716 Oral     SpO2 09/30/18 0716 99 %     Weight 09/30/18 0717 160 lb (72.6 kg)     Height 09/30/18 0717 '5\' 5"'$  (1.651 m)     Head Circumference --      Peak Flow --      Pain Score 09/30/18 0716 5     Pain Loc --      Pain Edu? --      Excl. in [place]? --    Constitutional: Alert and oriented. Well appearing and in no distress. Eyes: Normal exam ENT   Head: Normocephalic and atraumatic.   Mouth/Throat: Mucous membranes are moist. Cardiovascular: Normal rate, regular rhythm. No murmur Respiratory: Normal respiratory effort without tachypnea nor retractions. Breath sounds are clear  Gastrointestinal: Soft and nontender. No distention.  No CVA tenderness. Musculoskeletal: Nontender with normal range of motion in all extremities.   Neurologic:  Normal speech and language. No gross focal neurologic deficits  Skin:  Skin is warm, dry and intact.  Psychiatric: Mood and affect are normal.   ____________________________________________    RADIOLOGY  IMPRESSION: 1. Nonobstructing calculi in each kidney. No hydronephrosis. No evident ureteral calculi.  2. Status post gastric bypass grafting without complicating feature evident. No bowel obstruction.  3. No abscess evident in the abdomen or pelvis. Appendix appears normal.  4. Small hiatal hernia.  5. Uterus absent.  ____________________________________________   INITIAL IMPRESSION / ASSESSMENT AND PLAN / ED COURSE  Pertinent labs & imaging results that were available during my care of the patient were reviewed by me and considered in my medical decision making (see chart for details).  Patient presents to the emergency department for right flank pain starting early this morning.  History of kidney stones which this feels similar.  Largely negative review of systems.  Patient is status post right oophorectomy.  Differential this time would include ureterolithiasis, urinary tract infection, pyelonephritis, less likely appendicitis.  Reassuringly patient's abdominal exam is benign, no CVA tenderness.  We will check labs, treat with Toradol, IV fluids and obtain a CT renal scan.  Patient agreeable to plan of care.  CT scan is essentially negative for acute abnormality.  Patient's labs are largely within normal limits besides a possibility of a slight urinary tract infection.  We will dose fosfomycin x1 in the emergency department and a urine culture will be sent.  Overall the patient appears very well we will discharge with PCP follow-up.  ____________________________________________   FINAL CLINICAL IMPRESSION(S) / ED DIAGNOSES  Right flank pain Urinary tract infection   Harvest Dark, MD 09/30/18 [phone]

## 2018-09-30 NOTE — ED Notes (Signed)
NAD noted at time of D/C. Pt denies questions or concerns. Pt ambulatory to the lobby at this time.  

## 2018-10-01 LAB — URINE CULTURE

## 2018-10-11 DIAGNOSIS — N390 Urinary tract infection, site not specified: Secondary | ICD-10-CM | POA: Diagnosis not present

## 2018-10-22 ENCOUNTER — Ambulatory Visit: Payer: BLUE CROSS/BLUE SHIELD | Admitting: Internal Medicine

## 2018-10-28 ENCOUNTER — Ambulatory Visit: Payer: BLUE CROSS/BLUE SHIELD | Admitting: Family Medicine

## 2018-10-28 ENCOUNTER — Encounter: Payer: Self-pay | Admitting: Family Medicine

## 2018-10-28 ENCOUNTER — Other Ambulatory Visit: Payer: Self-pay

## 2018-10-28 VITALS — BP 122/70 | HR 92 | Temp 98.2°F | Resp 16 | Ht 65.0 in | Wt 185.2 lb

## 2018-10-28 DIAGNOSIS — M25471 Effusion, right ankle: Secondary | ICD-10-CM

## 2018-10-28 DIAGNOSIS — M25476 Effusion, unspecified foot: Secondary | ICD-10-CM

## 2018-10-28 DIAGNOSIS — M25473 Effusion, unspecified ankle: Secondary | ICD-10-CM | POA: Diagnosis not present

## 2018-10-28 DIAGNOSIS — M25475 Effusion, left foot: Secondary | ICD-10-CM

## 2018-10-28 LAB — CBC
HCT: 32.4 % — ABNORMAL LOW (ref 36.0–46.0)
Hemoglobin: 10.8 g/dL — ABNORMAL LOW (ref 12.0–15.0)
MCHC: 33.3 g/dL (ref 30.0–36.0)
MCV: 95.8 fl (ref 78.0–100.0)
PLATELETS: 291 10*3/uL (ref 150.0–400.0)
RBC: 3.39 Mil/uL — ABNORMAL LOW (ref 3.87–5.11)
RDW: 14.7 % (ref 11.5–15.5)
WBC: 6.2 10*3/uL (ref 4.0–10.5)

## 2018-10-28 LAB — B12 AND FOLATE PANEL: FOLATE: 22.6 ng/mL (ref >5.9)

## 2018-10-28 LAB — COMPREHENSIVE METABOLIC PANEL
ALT: 41 U/L — AB (ref 0–35)
AST: 37 U/L (ref 0–37)
Albumin: 3.5 g/dL (ref 3.5–5.2)
Alkaline Phosphatase: 50 U/L (ref 39–117)
BILIRUBIN TOTAL: 0.4 mg/dL (ref 0.2–1.2)
BUN: 13 mg/dL (ref 6–23)
CHLORIDE: 105 meq/L (ref 96–112)
CO2: 25 meq/L (ref 19–32)
Calcium: 8.7 mg/dL (ref 8.4–10.5)
Creatinine, Ser: 0.99 mg/dL (ref 0.40–1.20)
GFR: 72.72 mL/min (ref >60.00)
GLUCOSE: 46 mg/dL — AB (ref 70–99)
POTASSIUM: 3.5 meq/L (ref 3.5–5.1)
Sodium: 138 mEq/L (ref 135–145)
Total Protein: 6.5 g/dL (ref 6.0–8.3)

## 2018-10-28 LAB — VITAMIN D 25 HYDROXY (VIT D DEFICIENCY, FRACTURES): VITD: 47.95 ng/mL (ref 30.00–100.00)

## 2018-10-28 NOTE — Progress Notes (Signed)
Subjective:    Patient ID: Valerie Colon, female    DOB: 02/26/1971, 48 y.o.   MRN: [id number]  HPI   Patient presents to clinic complaining of bilateral swelling in both legs.  States she noticed that about a week ago, both feet and ankles and lower legs seems more puffy and swollen than usual.  Patient is a gastric bypass patient.  She does work out often, is active.  Denies any history of blood clotting disorders within herself or family.  Denies any recent long plane rides or long car rides.  Patient states she does have compression stockings, but has not tried wearing them yet.  Denies chest pain, palpitations, cough, shortness of breath, wheezing.  Patient Active Problem List   Diagnosis Date Noted  . Family history of osteoporosis 05/07/2018  . Cyst of left ovary 05/07/2018  . Cardiac murmur 02/12/2018  . Postprandial hypoglycemia 02/12/2018  . 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 Epigastric abdominal pain 11/13/2017  . Hypotension 11/13/2017  . Chronic gastritis 10/09/2017  . Migraine headache without aura 10/09/2017  . Soft tissue lesion of elbow region 10/09/2017  . Knee pain, bilateral 10/09/2017  . Shoulder pain, right 10/09/2017  . Pelvic pain 10/09/2017  . Breast calcifications 10/09/2017  . Increased frequency of headaches 10/02/2017  . Snoring 07/31/2017  . Sprain of ligament of tarsometatarsal joint 04/01/2016  . Nephrolithiasis 02/29/2016  . Chronic fatigue 01/24/2016  . Dizziness 01/24/2016  . Gastroesophageal reflux disease without esophagitis 01/24/2016  . GAD (generalized anxiety disorder) 11/20/2015  . Emotional stress 11/09/2015  . Intermittent palpitations 11/09/2015  . Vitamin D deficiency 07/09/2015  . Allergic rhinitis 03/29/2015  . Chronic anemia 01/08/2015  . Prediabetes 01/08/2015  . Obstructive apnea 01/20/2014  . Hypoglycemia after GI (gastrointestinal) surgery 11/26/2012   Social History   Tobacco  Use  . Smoking status: Never Smoker  . Smokeless tobacco: Never Used  Substance Use Topics  . Alcohol use: No    Alcohol/week: 0.0 standard drinks   Review of Systems   Constitutional: Negative for chills, fatigue and fever.  HENT: Negative for congestion, ear pain, sinus pain and sore throat.   Eyes: Negative.   Respiratory: Negative for cough, shortness of breath and wheezing.   Cardiovascular: Negative for chest pain, palpitations. +leg swelling.  Gastrointestinal: Negative for abdominal pain, diarrhea, nausea and vomiting.  Genitourinary: Negative for dysuria, frequency and urgency.  Musculoskeletal: Negative for arthralgias and myalgias.  Skin: Negative for color change, pallor and rash.  Neurological: Negative for syncope, light-headedness and headaches.  Psychiatric/Behavioral: The patient is not nervous/anxious.       Objective:   Physical Exam Vitals signs and nursing note reviewed.  Constitutional:      Appearance: She is well-developed.  HENT:     Head: Normocephalic and atraumatic.     Right Ear: Tympanic membrane, ear canal and external ear normal.     Left Ear: Tympanic membrane, ear canal and external ear normal.     Nose: Nose normal.     Mouth/Throat:     Mouth: Mucous membranes are moist.  Eyes:     General: No scleral icterus.       Right eye: No discharge.        Left eye: No discharge.     Extraocular Movements: Extraocular movements intact.     Conjunctiva/sclera: Conjunctivae normal.     Pupils: Pupils are equal, round, and reactive to light.  Neck:  Musculoskeletal: Normal range of motion and neck supple.     Trachea: No tracheal deviation.  Cardiovascular:     Rate and Rhythm: Normal rate and regular rhythm.     Heart sounds: Normal heart sounds. No murmur. No friction rub. No gallop.   Pulmonary:     Effort: Pulmonary effort is normal. No respiratory distress.     Breath sounds: Normal breath sounds. No stridor. No wheezing, rhonchi or  rales.  Abdominal:     General: Bowel sounds are normal. There is no distension.     Palpations: Abdomen is soft.     Tenderness: There is no abdominal tenderness. There is no guarding.  Musculoskeletal: Normal range of motion.     Right lower leg: Edema present.     Left lower leg: Edema present.     Comments: Bilateral lower extremities do appear swollen, but edema is nonpitting.  I would rated them at a level of +2.  Swelling seems more consistent with a lymphedema rather than excess fluid.  Lymphadenopathy:     Cervical: No cervical adenopathy.  Skin:    General: Skin is warm and dry.     Capillary Refill: Capillary refill takes less than 2 seconds.     Coloration: Skin is not pale.     Findings: No erythema.  Neurological:     Mental Status: She is alert and oriented to person, place, and time.     Cranial Nerves: No cranial nerve deficit.  Psychiatric:        Behavior: Behavior normal.        Thought Content: Thought content normal.     Vitals:   10/28/18 1340  BP: 122/70  Pulse: 92  Resp: 16  Temp: 98.2 F (36.8 C)  SpO2: 96%      Assessment & Plan:   Bilateral swelling of feet and ankles-we will do blood work to rule out any anemia, electrolyte abnormalities.  I suspect edema is from a lymphedema due to it being nonpitting.  Advised patient to wear compression stockings during the day, and elevate feet whenever able to while sitting, keep elevated overnight.  Patient is very active, goes to the gym often, Wells criteria for DVT is low.  If blood work is not remarkable for any issues, we can refer patient to vascular specialist for further evaluation.

## 2018-10-28 NOTE — Patient Instructions (Signed)

## 2018-10-29 LAB — THYROID PANEL WITH TSH
FREE THYROXINE INDEX: 0.6 — AB (ref 1.4–3.8)
T3 UPTAKE: 32 % (ref 22–35)
T4 TOTAL: 1.9 ug/dL — AB (ref 5.1–11.9)
TSH: <0.01 mIU/L — ABNORMAL LOW

## 2018-11-01 ENCOUNTER — Encounter: Payer: Self-pay | Admitting: Family Medicine

## 2018-11-01 DIAGNOSIS — E162 Hypoglycemia, unspecified: Secondary | ICD-10-CM

## 2018-11-01 DIAGNOSIS — R7989 Other specified abnormal findings of blood chemistry: Secondary | ICD-10-CM

## 2018-11-04 NOTE — Telephone Encounter (Signed)
Please call to set up lab appt for either tomorrow or next week  Thanks  LG

## 2018-11-05 ENCOUNTER — Telehealth: Payer: Self-pay | Admitting: Family Medicine

## 2018-11-05 NOTE — Telephone Encounter (Signed)
Please call to set up lab appt for patient - future orders in

## 2018-11-05 NOTE — Telephone Encounter (Signed)
I called Pt yesterday and scheduled her a lab appt for 4/17 @ 9:15am.

## 2018-11-17 ENCOUNTER — Encounter: Payer: Self-pay | Admitting: Family Medicine

## 2018-11-18 ENCOUNTER — Other Ambulatory Visit: Payer: BLUE CROSS/BLUE SHIELD

## 2018-12-03 ENCOUNTER — Other Ambulatory Visit: Payer: Self-pay

## 2018-12-03 ENCOUNTER — Other Ambulatory Visit (INDEPENDENT_AMBULATORY_CARE_PROVIDER_SITE_OTHER): Payer: BLUE CROSS/BLUE SHIELD

## 2018-12-03 DIAGNOSIS — E162 Hypoglycemia, unspecified: Secondary | ICD-10-CM

## 2018-12-03 DIAGNOSIS — R7989 Other specified abnormal findings of blood chemistry: Secondary | ICD-10-CM

## 2018-12-03 LAB — BASIC METABOLIC PANEL
BUN: 20 mg/dL (ref 6–23)
CO2: 26 mEq/L (ref 19–32)
Calcium: 9.7 mg/dL (ref 8.4–10.5)
Chloride: 104 mEq/L (ref 96–112)
Creatinine, Ser: 1.26 mg/dL — ABNORMAL HIGH (ref 0.40–1.20)
GFR: 55.03 mL/min — ABNORMAL LOW (ref >60.00)
Glucose, Bld: 68 mg/dL — ABNORMAL LOW (ref 70–99)
Potassium: 4.2 mEq/L (ref 3.5–5.1)
Sodium: 141 mEq/L (ref 135–145)

## 2018-12-03 LAB — HEMOGLOBIN A1C: Hgb A1c MFr Bld: 5.6 % (ref 4.6–6.5)

## 2018-12-04 LAB — THYROID PANEL WITH TSH
Free Thyroxine Index: 2.1 (ref 1.4–3.8)
T3 Uptake: 37 % — ABNORMAL HIGH (ref 22–35)
T4, Total: 5.7 ug/dL (ref 5.1–11.9)
TSH: 0.66 mIU/L

## 2018-12-07 ENCOUNTER — Encounter: Payer: Self-pay | Admitting: Family Medicine

## 2019-01-27 DIAGNOSIS — M179 Osteoarthritis of knee, unspecified: Secondary | ICD-10-CM | POA: Insufficient documentation

## 2019-01-27 DIAGNOSIS — M170 Bilateral primary osteoarthritis of knee: Secondary | ICD-10-CM | POA: Diagnosis not present

## 2019-01-27 DIAGNOSIS — M7542 Impingement syndrome of left shoulder: Secondary | ICD-10-CM | POA: Diagnosis not present

## 2019-01-27 DIAGNOSIS — M7540 Impingement syndrome of unspecified shoulder: Secondary | ICD-10-CM | POA: Insufficient documentation

## 2019-03-10 ENCOUNTER — Other Ambulatory Visit: Payer: Self-pay | Admitting: Internal Medicine

## 2019-03-10 DIAGNOSIS — Z1231 Encounter for screening mammogram for malignant neoplasm of breast: Secondary | ICD-10-CM

## 2019-03-11 ENCOUNTER — Ambulatory Visit: Payer: BLUE CROSS/BLUE SHIELD | Admitting: Internal Medicine

## 2019-05-04 ENCOUNTER — Other Ambulatory Visit: Payer: Self-pay

## 2019-05-06 ENCOUNTER — Encounter: Payer: Self-pay | Admitting: Internal Medicine

## 2019-05-06 ENCOUNTER — Ambulatory Visit: Payer: BC Managed Care – PPO | Admitting: Internal Medicine

## 2019-05-06 ENCOUNTER — Other Ambulatory Visit: Payer: Self-pay

## 2019-05-06 ENCOUNTER — Ambulatory Visit (INDEPENDENT_AMBULATORY_CARE_PROVIDER_SITE_OTHER): Payer: BC Managed Care – PPO

## 2019-05-06 VITALS — BP 116/76 | HR 73 | Temp 98.3°F | Ht 65.0 in | Wt 172.0 lb

## 2019-05-06 DIAGNOSIS — Z23 Encounter for immunization: Secondary | ICD-10-CM | POA: Diagnosis not present

## 2019-05-06 DIAGNOSIS — M79645 Pain in left finger(s): Secondary | ICD-10-CM

## 2019-05-06 DIAGNOSIS — R7989 Other specified abnormal findings of blood chemistry: Secondary | ICD-10-CM | POA: Diagnosis not present

## 2019-05-06 DIAGNOSIS — Z0000 Encounter for general adult medical examination without abnormal findings: Secondary | ICD-10-CM | POA: Diagnosis not present

## 2019-05-06 DIAGNOSIS — D649 Anemia, unspecified: Secondary | ICD-10-CM

## 2019-05-06 DIAGNOSIS — M7989 Other specified soft tissue disorders: Secondary | ICD-10-CM | POA: Diagnosis not present

## 2019-05-06 DIAGNOSIS — M79644 Pain in right finger(s): Secondary | ICD-10-CM | POA: Diagnosis not present

## 2019-05-06 HISTORY — DX: Encounter for general adult medical examination without abnormal findings: Z00.00

## 2019-05-06 NOTE — Patient Instructions (Addendum)
Call insurance and see if they will cover bone density with h/o hysterectomy FH osteoporosis   Debrox ear wax drops    Earwax Buildup, Adult The ears produce a substance called earwax that helps keep bacteria out of the ear and protects the skin in the ear canal. Occasionally, earwax can build up in the ear and cause discomfort or hearing loss. What increases the risk? This condition is more likely to develop in people who:  Are female.  Are elderly.  Naturally produce more earwax.  Clean their ears often with cotton swabs.  Use earplugs often.  Use in-ear headphones often.  Wear hearing aids.  Have narrow ear canals.  Have earwax that is overly thick or sticky.  Have eczema.  Are dehydrated.  Have excess hair in the ear canal. What are the signs or symptoms? Symptoms of this condition include:  Reduced or muffled hearing.  A feeling of fullness in the ear or feeling that the ear is plugged.  Fluid coming from the ear.  Ear pain.  Ear itch.  Ringing in the ear.  Coughing.  An obvious piece of earwax that can be seen inside the ear canal. How is this diagnosed? This condition may be diagnosed based on:  Your symptoms.  Your medical history.  An ear exam. During the exam, your health care provider will look into your ear with an instrument called an otoscope. You may have tests, including a hearing test. How is this treated? This condition may be treated by:  Using ear drops to soften the earwax.  Having the earwax removed by a health care provider. The health care provider may: ? Flush the ear with water. ? Use an instrument that has a loop on the end (curette). ? Use a suction device.  Surgery to remove the wax buildup. This may be done in severe cases. Follow these instructions at home:   Take over-the-counter and prescription medicines only as told by your health care provider.  Do not put any objects, including cotton swabs, into your ear.  You can clean the opening of your ear canal with a washcloth or facial tissue.  Follow instructions from your health care provider about cleaning your ears. Do not over-clean your ears.  Drink enough fluid to keep your urine clear or pale yellow. This will help to thin the earwax.  Keep all follow-up visits as told by your health care provider. If earwax builds up in your ears often or if you use hearing aids, consider seeing your health care provider for routine, preventive ear cleanings. Ask your health care provider how often you should schedule your cleanings.  If you have hearing aids, clean them according to instructions from the manufacturer and your health care provider. Contact a health care provider if:  You have ear pain.  You develop a fever.  You have blood, pus, or other fluid coming from your ear.  You have hearing loss.  You have ringing in your ears that does not go away.  Your symptoms do not improve with treatment.  You feel like the room is spinning (vertigo). Summary  Earwax can build up in the ear and cause discomfort or hearing loss.  The most common symptoms of this condition include reduced or muffled hearing and a feeling of fullness in the ear or feeling that the ear is plugged.  This condition may be diagnosed based on your symptoms, your medical history, and an ear exam.  This condition may be treated  by using ear drops to soften the earwax or by having the earwax removed by a health care provider.  Do not put any objects, including cotton swabs, into your ear. You can clean the opening of your ear canal with a washcloth or facial tissue. This information is not intended to replace advice given to you by your health care provider. Make sure you discuss any questions you have with your health care provider. Document Released: 09/11/2004 Document Revised: 07/17/2017 Document Reviewed: 10/15/2016 Elsevier Patient Education  [place].  Carbamide Peroxide ear solution What is this medicine? CARBAMIDE PEROXIDE (CAR bah mide per OX ide) is used to soften and help remove ear wax. This medicine may be used for other purposes; ask your health care provider or pharmacist if you have questions. COMMON BRAND NAME(S): Auro Ear, Auro Earache Relief, Debrox, Ear Drops, Ear Wax Removal, Ear Wax Remover, Earwax Treatment, Murine, Thera-Ear What should I tell my health care provider before I take this medicine? They need to know if you have any of these conditions:  dizziness  ear discharge  ear pain, irritation or rash  infection  perforated eardrum (hole in eardrum)  an unusual or allergic reaction to carbamide peroxide, glycerin, hydrogen peroxide, other medicines, foods, dyes, or preservatives  pregnant or trying to get pregnant  breast-feeding How should I use this medicine? This medicine is only for use in the outer ear canal. Follow the directions carefully. Wash hands before and after use. The solution may be warmed by holding the bottle in the hand for 1 to 2 minutes. Lie with the affected ear facing upward. Place the proper number of drops into the ear canal. After the drops are instilled, remain lying with the affected ear upward for 5 minutes to help the drops stay in the ear canal. A cotton ball may be gently inserted at the ear opening for no longer than 5 to 10 minutes to ensure retention. Repeat, if necessary, for the opposite ear. Do not touch the tip of the dropper to the ear, fingertips, or other surface. Do not rinse the dropper after use. Keep container tightly closed. Talk to your pediatrician regarding the use of this medicine in children. While this drug may be used in children as young as 12 years for selected conditions, precautions do apply. Overdosage: If you think you have taken too much of this medicine contact a poison control center or emergency room at once. NOTE: This medicine is only for you.  Do not share this medicine with others. What if I miss a dose? If you miss a dose, use it as soon as you can. If it is almost time for your next dose, use only that dose. Do not use double or extra doses. What may interact with this medicine? Interactions are not expected. Do not use any other ear products without asking your doctor or health care professional. This list may not describe all possible interactions. Give your health care provider a list of all the medicines, herbs, non-prescription drugs, or dietary supplements you use. Also tell them if you smoke, drink alcohol, or use illegal drugs. Some items may interact with your medicine. What should I watch for while using this medicine? This medicine is not for long-term use. Do not use for more than 4 days without checking with your health care professional. Contact your doctor or health care professional if your condition does not start to get better within a few days or if you notice  burning, redness, itching or swelling. What side effects may I notice from receiving this medicine? Side effects that you should report to your doctor or health care professional as soon as possible:  allergic reactions like skin rash, itching or hives, swelling of the face, lips, or tongue  burning, itching, and redness  worsening ear pain  rash Side effects that usually do not require medical attention (report to your doctor or health care professional if they continue or are bothersome):  abnormal sensation while putting the drops in the ear  temporary reduction in hearing (but not complete loss of hearing) This list may not describe all possible side effects. Call your doctor for medical advice about side effects. You may report side effects to FDA at 1-800-FDA-1088. Where should I keep my medicine? Keep out of the reach of children. Store at room temperature between 15 and 30 degrees C (59 and 86 degrees F) in a tight, light-resistant container.  Keep bottle away from excessive heat and direct sunlight. Throw away any unused medicine after the expiration date. NOTE: This sheet is a summary. It may not cover all possible information. If you have questions about this medicine, talk to your doctor, pharmacist, or health care provider.  2020 Elsevier/Gold Standard (2007-11-16 14:00:02)

## 2019-05-06 NOTE — Progress Notes (Signed)
Chief Complaint  Patient presents with  . Follow-up   Annual  1. Anxiety improved after training for body building  2. Kidney stones no issues. Left ovarian cysts no issues, migraines no issues  3. C/o right middle finger x 3 months between PIP and DIP pain and swelling and lump she wants to know what it is established with [facility]     Review of Systems  Constitutional: Negative for weight loss.  HENT: Negative for hearing loss.   Eyes: Negative for blurred vision.  Respiratory: Negative for shortness of breath.   Cardiovascular: Negative for chest pain.  Gastrointestinal: Negative for abdominal pain.  Musculoskeletal: Positive for joint pain.  Skin: Negative for rash.  Neurological: Negative for headaches.  Psychiatric/Behavioral: Negative for depression. The patient is not nervous/anxious.    Past Medical History:  Diagnosis Date  . Acid reflux   . Allergic rhinitis   . Allergy   . Anxiety   . Heart murmur   . History of hiatal hernia   . Kidney stones   . Migraine   . Ovarian cyst, left    Past Surgical History:  Procedure Laterality Date  . ABDOMINAL HYSTERECTOMY     2008  . BREAST BIOPSY Right 12/19/2013   stereo right- neg  . CHOLECYSTECTOMY    . COLONOSCOPY WITH PROPOFOL N/A 05/05/2016   Procedure: COLONOSCOPY WITH PROPOFOL;  Surgeon: Lollie Sails, MD;  Location: [facility] ENDOSCOPY;  Service: Endoscopy;  Laterality: N/A;  . ESOPHAGOGASTRODUODENOSCOPY (EGD) WITH PROPOFOL N/A 05/05/2016   Procedure: ESOPHAGOGASTRODUODENOSCOPY (EGD) WITH PROPOFOL;  Surgeon: Lollie Sails, MD;  Location: [facility] ENDOSCOPY;  Service: Endoscopy;  Laterality: N/A;  . EXTRACORPOREAL SHOCK WAVE LITHOTRIPSY Left 11/06/2016   Procedure: EXTRACORPOREAL SHOCK WAVE LITHOTRIPSY (ESWL);  Surgeon: Nickie Retort, MD;  Location: [facility] ORS;  Service: Urology;  Laterality: Left;  . gallbladder  2012  . GASTRIC BYPASS  2012  . REDUCTION MAMMAPLASTY Bilateral    2010   Family History   Problem Relation Age of Onset  . Prostate cancer Father   . Hyperlipidemia Father   . Hypertension Father   . Depression Father   . Kidney Stones Maternal Grandfather   . Stroke Maternal Grandfather   . Cancer Maternal Grandfather        prostate cancer   . Diabetes Mother   . Hyperlipidemia Mother   . Hypertension Mother   . Multiple sclerosis Sister   . Heart disease Maternal Grandmother   . Cancer Other        breast cancer   . Breast cancer Other   . Kidney disease Neg Hx   . Bladder Cancer Neg Hx    Social History   Socioeconomic History  . Marital status: Married    Spouse name: Not on file  . Number of children: Not on file  . Years of education: Not on file  . Highest education level: Not on file  Occupational History  . Not on file  Social Needs  . Financial resource strain: Not on file  . Food insecurity    Worry: Not on file    Inability: Not on file  . Transportation needs    Medical: Not on file    Non-medical: Not on file  Tobacco Use  . Smoking status: Never Smoker  . Smokeless tobacco: Never Used  Substance and Sexual Activity  . Alcohol use: No    Alcohol/week: 0.0 standard drinks  . Drug use: No  . Sexual activity: Not  on file  Lifestyle  . Physical activity    Days per week: Not on file    Minutes per session: Not on file  . Stress: Not on file  Relationships  . Social Herbalist on phone: Not on file    Gets together: Not on file    Attends religious service: Not on file    Active member of club or organization: Not on file    Attends meetings of clubs or organizations: Not on file    Relationship status: Not on file  . Intimate partner violence    Fear of current or ex partner: Not on file    Emotionally abused: Not on file    Physically abused: Not on file    Forced sexual activity: Not on file  Other Topics Concern  . Not on file  Social History Narrative   Works in [employer]ology     Married to significant other    Water engineer    Wears seatbelt    Has gun at home    Safe in relationship    Current Meds  Medication Sig  . Cyanocobalamin (RA VITAMIN B-12 TR) 1000 MCG TBCR Take by mouth.  . ferrous sulfate 325 (65 FE) MG EC tablet Take 325 mg by mouth 3 (three) times daily with meals.  . fluticasone (FLONASE) 50 MCG/ACT nasal spray Place 1 spray into both nostrils daily.  . Magnesium 400 MG CAPS Take by mouth.  . Multiple Vitamin (MULTIVITAMIN) tablet Take 1 tablet by mouth daily.  . SUMAtriptan (IMITREX) 100 MG tablet TK 1 T PO ONCE PRF MIGRAINE. MAY TAKE A SECOND DOSE AFTER 2 H IF NEEDED   No Known Allergies No results found for this or any previous visit (from the past 2160 hour(s)). Objective  Body mass index is 28.62 kg/m. Wt Readings from Last 3 Encounters:  05/06/19 172 lb (78 kg)  10/28/18 185 lb 3.2 oz (84 kg)  09/30/18 160 lb (72.6 kg)   Temp Readings from Last 3 Encounters:  05/06/19 98.3 F (36.8 C) (Oral)  10/28/18 98.2 F (36.8 C) (Oral)  09/30/18 98.1 F (36.7 C) (Oral)   BP Readings from Last 3 Encounters:  05/06/19 116/76  10/28/18 122/70  09/30/18 107/64   Pulse Readings from Last 3 Encounters:  05/06/19 73  10/28/18 92  09/30/18 69    Physical Exam Vitals signs and nursing note reviewed.  Constitutional:      Appearance: Normal appearance. She is well-developed and well-groomed.  HENT:     Head: Normocephalic and atraumatic.     Comments: +mask on   Eyes:     Conjunctiva/sclera: Conjunctivae normal.     Pupils: Pupils are equal, round, and reactive to light.  Cardiovascular:     Rate and Rhythm: Normal rate and regular rhythm.     Heart sounds: Normal heart sounds. No murmur.  Pulmonary:     Effort: Pulmonary effort is normal.     Breath sounds: Normal breath sounds.  Musculoskeletal:       Hands:  Skin:    General: Skin is warm and dry.  Neurological:     General: No focal deficit present.     Mental Status:  She is alert and oriented to person, place, and time. Mental status is at baseline.  Psychiatric:        Attention and Perception: Attention and perception normal.  Mood and Affect: Mood and affect normal.        Speech: Speech normal.        Behavior: Behavior normal. Behavior is cooperative.        Thought Content: Thought content normal.        Cognition and Memory: Cognition and memory normal.        Judgment: Judgment normal.     Assessment  Plan  Annual physical exam  Need for immunization against influenza - Plan: Flu Vaccine QUAD 36+ mos IM  Flu shot given today  Tdap had 6/8/17and 01/16/18 [facility] B [facility]  Consider check HIV in future  S/p hysterectomyonly left with right ovary f/u OB/GYN seen 06/2018  mammo benign findings 8/2019scheduled Colonoscopy had 04/2016 [facility] repeat rec in 5 years [facility] Consider DEXA FH osteoporosis check with insurance to see if covered   Pain of left middle finger - Plan: DG Finger Middle Right -if neg [facility] hand  Elevated serum creatinine - Plan: Comprehensive metabolic panel,   Anemia, chronic due to wt loss surgery - Plan: Iron, TIBC and Ferritin Panel, CBC with Differential/Platelet    Provider: Dr. Olivia Mackie McLean-Scocuzza-Internal Medicine

## 2019-05-07 LAB — CBC WITH DIFFERENTIAL/PLATELET
Absolute Monocytes: 673 cells/uL (ref 200–950)
Basophils Absolute: 27 cells/uL (ref 0–200)
Basophils Relative: 0.4 %
Eosinophils Absolute: 122 cells/uL (ref 15–500)
Eosinophils Relative: 1.8 %
HCT: 47.8 % — ABNORMAL HIGH (ref 35.0–45.0)
Hemoglobin: 15.8 g/dL — ABNORMAL HIGH (ref 11.7–15.5)
Lymphs Abs: 2319 cells/uL (ref 850–3900)
MCH: 30.6 pg (ref 27.0–33.0)
MCHC: 33.1 g/dL (ref 32.0–36.0)
MCV: 92.5 fL (ref 80.0–100.0)
MPV: 11.1 fL (ref 7.5–12.5)
Monocytes Relative: 9.9 %
Neutro Abs: 3658 cells/uL (ref 1500–7800)
Neutrophils Relative %: 53.8 %
Platelets: 287 10*3/uL (ref 140–400)
RBC: 5.17 10*6/uL — ABNORMAL HIGH (ref 3.80–5.10)
RDW: 14.5 % (ref 11.0–15.0)
Total Lymphocyte: 34.1 %
WBC: 6.8 10*3/uL (ref 3.8–10.8)

## 2019-05-07 LAB — COMPREHENSIVE METABOLIC PANEL
AG Ratio: 1.3 (calc) (ref 1.0–2.5)
ALT: 26 U/L (ref 6–29)
AST: 41 U/L — ABNORMAL HIGH (ref 10–35)
Albumin: 4.1 g/dL (ref 3.6–5.1)
Alkaline phosphatase (APISO): 53 U/L (ref 31–125)
BUN/Creatinine Ratio: 16 (calc) (ref 6–22)
BUN: 23 mg/dL (ref 7–25)
CO2: 21 mmol/L (ref 20–32)
Calcium: 9.6 mg/dL (ref 8.6–10.2)
Chloride: 107 mmol/L (ref 98–110)
Creat: 1.45 mg/dL — ABNORMAL HIGH (ref 0.50–1.10)
Globulin: 3.2 g/dL (calc) (ref 1.9–3.7)
Glucose, Bld: 62 mg/dL — ABNORMAL LOW (ref 65–99)
Potassium: 4.7 mmol/L (ref 3.5–5.3)
Sodium: 140 mmol/L (ref 135–146)
Total Bilirubin: 0.3 mg/dL (ref 0.2–1.2)
Total Protein: 7.3 g/dL (ref 6.1–8.1)

## 2019-05-07 LAB — IRON,TIBC AND FERRITIN PANEL
%SAT: 8 % (calc) — ABNORMAL LOW (ref 16–45)
Ferritin: 12 ng/mL — ABNORMAL LOW (ref 16–232)
Iron: 33 ug/dL — ABNORMAL LOW (ref 40–190)
TIBC: 421 mcg/dL (calc) (ref 250–450)

## 2019-05-09 ENCOUNTER — Encounter: Payer: Self-pay | Admitting: Internal Medicine

## 2019-05-09 ENCOUNTER — Other Ambulatory Visit: Payer: Self-pay | Admitting: Internal Medicine

## 2019-05-09 DIAGNOSIS — M79644 Pain in right finger(s): Secondary | ICD-10-CM

## 2019-05-12 DIAGNOSIS — R2231 Localized swelling, mass and lump, right upper limb: Secondary | ICD-10-CM | POA: Diagnosis not present

## 2019-05-20 ENCOUNTER — Ambulatory Visit
Admission: RE | Admit: 2019-05-20 | Discharge: 2019-05-20 | Disposition: A | Discharge status: Home / Self Care | Payer: BC Managed Care – PPO | Source: Ambulatory Visit | Attending: Internal Medicine | Admitting: Internal Medicine

## 2019-05-20 DIAGNOSIS — Z1231 Encounter for screening mammogram for malignant neoplasm of breast: Secondary | ICD-10-CM | POA: Insufficient documentation

## 2019-05-26 DIAGNOSIS — B353 Tinea pedis: Secondary | ICD-10-CM | POA: Diagnosis not present

## 2019-05-26 DIAGNOSIS — M79672 Pain in left foot: Secondary | ICD-10-CM | POA: Diagnosis not present

## 2019-05-26 DIAGNOSIS — L858 Other specified epidermal thickening: Secondary | ICD-10-CM | POA: Diagnosis not present

## 2019-05-26 DIAGNOSIS — M79671 Pain in right foot: Secondary | ICD-10-CM | POA: Diagnosis not present

## 2019-07-08 DIAGNOSIS — M79671 Pain in right foot: Secondary | ICD-10-CM | POA: Diagnosis not present

## 2019-07-08 DIAGNOSIS — M79672 Pain in left foot: Secondary | ICD-10-CM | POA: Diagnosis not present

## 2019-07-08 DIAGNOSIS — L309 Dermatitis, unspecified: Secondary | ICD-10-CM | POA: Diagnosis not present

## 2019-08-05 DIAGNOSIS — Z1231 Encounter for screening mammogram for malignant neoplasm of breast: Secondary | ICD-10-CM | POA: Diagnosis not present

## 2019-08-05 DIAGNOSIS — Z1272 Encounter for screening for malignant neoplasm of vagina: Secondary | ICD-10-CM | POA: Diagnosis not present

## 2019-08-05 DIAGNOSIS — Z01419 Encounter for gynecological examination (general) (routine) without abnormal findings: Secondary | ICD-10-CM | POA: Diagnosis not present

## 2019-08-05 DIAGNOSIS — Z1211 Encounter for screening for malignant neoplasm of colon: Secondary | ICD-10-CM | POA: Diagnosis not present

## 2019-08-11 LAB — HM PAP SMEAR: HM Pap smear: NORMAL

## 2019-08-22 DIAGNOSIS — Z1211 Encounter for screening for malignant neoplasm of colon: Secondary | ICD-10-CM | POA: Diagnosis not present

## 2019-11-04 ENCOUNTER — Other Ambulatory Visit: Payer: Self-pay

## 2019-11-08 ENCOUNTER — Encounter: Payer: Self-pay | Admitting: Internal Medicine

## 2019-11-08 ENCOUNTER — Ambulatory Visit: Payer: BC Managed Care – PPO | Admitting: Internal Medicine

## 2019-11-08 ENCOUNTER — Other Ambulatory Visit: Payer: Self-pay

## 2019-11-08 VITALS — BP 114/76 | HR 73 | Temp 97.6°F | Ht 65.0 in | Wt 169.0 lb

## 2019-11-08 DIAGNOSIS — Z13220 Encounter for screening for lipoid disorders: Secondary | ICD-10-CM

## 2019-11-08 DIAGNOSIS — R42 Dizziness and giddiness: Secondary | ICD-10-CM

## 2019-11-08 DIAGNOSIS — M2550 Pain in unspecified joint: Secondary | ICD-10-CM | POA: Diagnosis not present

## 2019-11-08 DIAGNOSIS — E611 Iron deficiency: Secondary | ICD-10-CM

## 2019-11-08 DIAGNOSIS — M25849 Other specified joint disorders, unspecified hand: Secondary | ICD-10-CM | POA: Diagnosis not present

## 2019-11-08 DIAGNOSIS — E559 Vitamin D deficiency, unspecified: Secondary | ICD-10-CM

## 2019-11-08 DIAGNOSIS — Z1231 Encounter for screening mammogram for malignant neoplasm of breast: Secondary | ICD-10-CM

## 2019-11-08 DIAGNOSIS — M778 Other enthesopathies, not elsewhere classified: Secondary | ICD-10-CM

## 2019-11-08 DIAGNOSIS — R232 Flushing: Secondary | ICD-10-CM

## 2019-11-08 DIAGNOSIS — M25561 Pain in right knee: Secondary | ICD-10-CM

## 2019-11-08 DIAGNOSIS — G8929 Other chronic pain: Secondary | ICD-10-CM

## 2019-11-08 DIAGNOSIS — E538 Deficiency of other specified B group vitamins: Secondary | ICD-10-CM

## 2019-11-08 DIAGNOSIS — E2839 Other primary ovarian failure: Secondary | ICD-10-CM

## 2019-11-08 DIAGNOSIS — R768 Other specified abnormal immunological findings in serum: Secondary | ICD-10-CM | POA: Diagnosis not present

## 2019-11-08 DIAGNOSIS — F419 Anxiety disorder, unspecified: Secondary | ICD-10-CM

## 2019-11-08 DIAGNOSIS — R7989 Other specified abnormal findings of blood chemistry: Secondary | ICD-10-CM

## 2019-11-08 DIAGNOSIS — R61 Generalized hyperhidrosis: Secondary | ICD-10-CM

## 2019-11-08 DIAGNOSIS — M25562 Pain in left knee: Secondary | ICD-10-CM

## 2019-11-08 DIAGNOSIS — E162 Hypoglycemia, unspecified: Secondary | ICD-10-CM

## 2019-11-08 DIAGNOSIS — R5383 Other fatigue: Secondary | ICD-10-CM

## 2019-11-08 NOTE — Progress Notes (Signed)
Chief Complaint  Patient presents with  . Follow-up  . Elbow Pain    burning pain in tight elbow. 5/10 pain. Ongoing for some months now, pt works out regularly   F/u  1. C/o right elbow pain with ROM and mild pain outside elbow w/o swelling with working out x weeks to up to 4 months nothing tried but does have voltaren gel at home  2. C/o nodules to joints of hands h/o bursitis and b/l knee pain with steroid shots causing her to swell or gain weight 3. Going through separation from wife Vivien Rota since last week she is doing ok and thinks this is best  4. Anxiety improved since #3 and since exercise no need for medication    Review of Systems  Constitutional: Negative for weight loss.  HENT: Negative for hearing loss.   Eyes: Negative for blurred vision.  Respiratory: Negative for shortness of breath.   Cardiovascular: Negative for chest pain.  Musculoskeletal: Positive for joint pain.  Skin: Negative for rash.   Past Medical History:  Diagnosis Date  . Acid reflux   . Allergic rhinitis   . Allergy   . Anxiety   . Heart murmur   . History of hiatal hernia   . Kidney stones   . Migraine   . Ovarian cyst, left    Past Surgical History:  Procedure Laterality Date  . ABDOMINAL HYSTERECTOMY     2008  . BREAST BIOPSY Right 12/19/2013   stereo right- neg  . CHOLECYSTECTOMY    . COLONOSCOPY WITH PROPOFOL N/A 05/05/2016   Procedure: COLONOSCOPY WITH PROPOFOL;  Surgeon: Lollie Sails, MD;  Location: [facility] ENDOSCOPY;  Service: Endoscopy;  Laterality: N/A;  . ESOPHAGOGASTRODUODENOSCOPY (EGD) WITH PROPOFOL N/A 05/05/2016   Procedure: ESOPHAGOGASTRODUODENOSCOPY (EGD) WITH PROPOFOL;  Surgeon: Lollie Sails, MD;  Location: [facility] ENDOSCOPY;  Service: Endoscopy;  Laterality: N/A;  . EXTRACORPOREAL SHOCK WAVE LITHOTRIPSY Left 11/06/2016   Procedure: EXTRACORPOREAL SHOCK WAVE LITHOTRIPSY (ESWL);  Surgeon: Nickie Retort, MD;  Location: [facility] ORS;  Service: Urology;  Laterality: Left;  .  gallbladder  2012  . GASTRIC BYPASS  2012  . REDUCTION MAMMAPLASTY Bilateral    2010   Family History  Problem Relation Age of Onset  . Prostate cancer Father   . Hyperlipidemia Father   . Hypertension Father   . Depression Father   . Kidney Stones Maternal Grandfather   . Stroke Maternal Grandfather   . Cancer Maternal Grandfather        prostate cancer   . Diabetes Mother   . Hyperlipidemia Mother   . Hypertension Mother   . Osteoporosis Mother        ? if has this   . Multiple sclerosis Sister   . Heart disease Maternal Grandmother   . Cancer Other        breast cancer   . Breast cancer Other   . Osteoporosis Maternal Aunt   . Kidney disease Neg Hx   . Bladder Cancer Neg Hx    Social History   Socioeconomic History  . Marital status: Legally Separated    Spouse name: Not on file  . Number of children: Not on file  . Years of education: Not on file  . Highest education level: Not on file  Occupational History  . Not on file  Tobacco Use  . Smoking status: Never Smoker  . Smokeless tobacco: Never Used  Substance and Sexual Activity  . Alcohol use: No  Alcohol/week: 0.0 standard drinks  . Drug use: No  . Sexual activity: Not on file  Other Topics Concern  . 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 Difficulty of Paying Living Expenses:   Food Insecurity:   . Worried About Charity fundraiser in the Last Year:   . Arboriculturist in the Last Year:   Transportation Needs:   . Film/video editor (Medical):   Marland Kitchen Lack of Transportation (Non-Medical):   Physical Activity:   . Days of Exercise per Week:   . Minutes of Exercise per Session:   Stress:   . Feeling of Stress :   Social  Connections:   . Frequency of Communication with Friends and Family:   . Frequency of Social Gatherings with Friends and Family:   . Attends Religious Services:   . Active Member of Clubs or Organizations:   . Attends Archivist Meetings:   Marland Kitchen Marital Status:   Intimate Partner Violence:   . Fear of Current or Ex-Partner:   . Emotionally Abused:   Marland Kitchen Physically Abused:   . Sexually Abused:    Current Meds  Medication Sig  . Cyanocobalamin (RA VITAMIN B-12 TR) 1000 MCG TBCR Take by mouth.  . ferrous sulfate 325 (65 FE) MG EC tablet Take 325 mg by mouth 3 (three) times daily with meals.  . Magnesium 400 MG CAPS Take by mouth.  . Multiple Vitamin (MULTIVITAMIN) tablet Take 1 tablet by mouth daily.   No Known Allergies No results found for this or any previous visit (from the past 2160 hour(s)). Objective  Body mass index is 28.12 kg/m. Wt Readings from Last 3 Encounters:  11/08/19 169 lb (76.7 kg)  05/06/19 172 lb (78 kg)  10/28/18 185 lb 3.2 oz (84 kg)   Temp Readings from Last 3 Encounters:  11/08/19 97.6 F (36.4 C) (Temporal)  05/06/19 98.3 F (36.8 C) (Oral)  10/28/18 98.2 F (36.8 C) (Oral)   BP Readings from Last 3 Encounters:  11/08/19 114/76  05/06/19 116/76  10/28/18 122/70   Pulse Readings from Last 3 Encounters:  11/08/19 73  05/06/19 73  10/28/18 92    Physical Exam Vitals and nursing note reviewed.  Constitutional:      Appearance: Normal appearance. She is well-developed and well-groomed.  HENT:     Head: Normocephalic and atraumatic.  Eyes:     Conjunctiva/sclera: Conjunctivae normal.     Pupils: Pupils are equal, round, and reactive to light.  Cardiovascular:     Rate and Rhythm: Normal rate and regular rhythm.     Heart sounds: Normal heart sounds. No murmur.  Pulmonary:     Effort: Pulmonary effort is normal.     Breath sounds: Normal breath sounds.  Musculoskeletal:     Right elbow: Tenderness present.       Arms:  Skin:     General: Skin is warm and dry.     Comments: Nodules b/l hands to area between DIP and PIP jts   Neurological:     General: No focal deficit present.     Mental  Status: She is alert and oriented to person, place, and time. Mental status is at baseline.     Gait: Gait normal.  Psychiatric:        Attention and Perception: Attention and perception normal.        Mood and Affect: Mood and affect normal.        Speech: Speech normal.        Behavior: Behavior normal. Behavior is cooperative.        Thought Content: Thought content normal.        Cognition and Memory: Cognition and memory normal.        Judgment: Judgment normal.     Assessment  Plan  Elbow tendonitis - Plan: Ambulatory referral to Orthopedic Surgery rx counter force brace right elbow  Given exercises  Use voltaren gel otc at home   Polyarthralgia - Plan: Antinuclear Antib (ANA), Rheumatoid Factor, Sedimentation rate, C-reactive protein, Cyclic citrul peptide antibody, IgG ([facility]), Ambulatory referral to Orthopedic Surgery  Cyst of joint of hand, unspecified laterality - Plan: Ambulatory referral to Orthopedic Surgery  Anxiety improved since separation wife which was a stressor and improved with exercise   Chronic pain of both knees - Plan: Ambulatory referral to Orthopedic Surgery   HM Flu shot given utd covid vx moderna 1st 11/01/19  Tdap had 6/8/17and 01/16/18 Hep B immune[facility]  Consider check HIV in future  S/p hysterectomyonly left with right ovary f/u OB/GYNseen 06/2018   mammo10/09/2018 negative   Colonoscopy had 04/2016 [facility] repeat rec in 5 years [facility]  Consider DEXA FH osteoporosis check with insurance to see if covered     Provider: Dr. Olivia Mackie McLean-Scocuzza-Internal Medicine

## 2019-11-08 NOTE — Patient Instructions (Addendum)
Consider bone density check with insurance and send a my chart message   Golfer's Elbow Rehab Ask your health care provider which exercises are safe for you. Do exercises exactly as told by your health care provider and adjust them as directed. It is normal to feel mild stretching, pulling, tightness, or discomfort as you do these exercises. Stop right away if you feel sudden pain or your pain gets worse. Do not begin these exercises until told by your health care provider. Stretching and range-of-motion exercises These exercises warm up your muscles and joints and improve the movement and flexibility of your elbow. Wrist extension  1. Straighten your left / right elbow in front of you with your palm facing up toward the ceiling. ? If told by your health care provider, bend your left / right elbow to a 90-degree angle (right angle) at your side. 2. With your other hand, gently pull your left / right hand and fingers toward the floor (extension). Stop when you feel a gentle stretch on the palm side of your forearm. 3. Hold this position for __________ seconds. Repeat __________ times. Complete this exercise __________ times a day. Wrist flexion  1. Straighten your left / right elbow in front of you with your palm facing down toward the floor. ? If told by your health care provider, bend your left / right elbow to a 90-degree angle (right angle) at your side. 2. With your other hand, gently push over the back of your left / right hand so your fingers point toward the floor (flexion). Stop when you feel a gentle stretch on the back of your forearm. 3. Hold this position for __________ seconds. Repeat __________ times. Complete this exercise __________ times a day. Forearm rotation, supination 1. Sit or stand with your elbows at your side. 2. Bend your left / right elbow to a 90-degree angle (right angle). 3. Using your uninjured hand, turn your left / right palm up toward the ceiling (supination)  until you feel a gentle stretch along the inside of your forearm. 4. Hold this position for __________ seconds. Repeat __________ times. Complete this exercise __________ times a day. Forearm rotation, pronation 1. Sit or stand with your elbows at your side. 2. Bend your left / right elbow to a 90-degree angle (right angle). 3. Using your uninjured hand, turn your left / right palm down toward the floor (pronation) until you feel a gentle stretch along the top of your forearm. 4. Hold this position for __________ seconds. Repeat __________ times. Complete this exercise __________ times a day. Strengthening exercises These exercises build strength and endurance in your elbow. Endurance is the ability to use your muscles for a long time, even after they get tired. Wrist flexion  1. Sit with your left / right forearm supported on a table or other surface and your palm turned up toward the ceiling. Let your left / right wrist extend over the edge of the surface. 2. Hold a __________ weight or a piece of rubber exercise band or tubing. ? If using a rubber exercise band or tubing, hold the other end of the tubing with your other hand. 3. Slowly bend your wrist so your hand moves up toward the ceiling (flexion). Try to only move your wrist and keep the rest of your arm still. 4. Hold this position for __________ seconds. 5. Slowly return to the starting position. Repeat __________ times. Complete this exercise __________ times a day. Wrist flexion, eccentric 1. Sit with  your left / right forearm palm-up and supported on a table or other surface. Let your left / right wrist extend over the edge of the surface. 2. Hold a __________ weight or a piece of rubber exercise band or tubing in your left / right hand. ? If using a rubber exercise band or tubing, hold the other end of the tubing with your other hand. 3. Use your uninjured hand to move your left / right hand up toward the ceiling. 4. Take your  uninjured hand away and slowly return to the starting position using only your left / right hand (eccentric flexion). Repeat __________ times. Complete this exercise __________ times a day. Forearm rotation, pronation To do this exercise, you will need a lightweight hammer or rubber mallet. 1. Sit with your left / right forearm supported on a table or other surface. Bend your elbow to a 90-degree angle (right angle). Position your forearm so that your palm is facing up toward the ceiling, with your hand resting over the edge of the table. 2. Hold a hammer in your left / right hand. ? To make this exercise easier, hold the hammer near the head of the hammer. ? To make this exercise harder, hold the hammer near the end of the handle. 3. Without moving your elbow, slowly turn (rotate) your forearm so your palm faces down toward the floor (pronation). 4. Hold this position for __________ seconds. 5. Slowly return to the starting position. Repeat __________ times. Complete this exercise __________ times a day. Shoulder blade squeeze 1. Sit in a stable chair or stand with good posture. If you are sitting down, do not let your back touch the back of the chair. 2. Your arms should be at your sides with your elbows bent to a 90-degree angle (right angle). Position your forearms so that your thumbs are facing the ceiling (neutral position). 3. Without lifting your shoulders up, squeeze your shoulder blades tightly together. 4. Hold this position for __________ seconds. 5. Slowly release and return to the starting position. Repeat __________ times. Complete this exercise __________ times a day. This information is not intended to replace advice given to you by your health care provider. Make sure you discuss any questions you have with your health care provider. Document Revised: 11/25/2018 Document Reviewed: 09/28/2018 Elsevier Patient Education  [place].  Tennis Elbow  Tennis elbow  (lateral epicondylitis) is inflammation of tendons in your outer forearm, near your elbow. Tendons are tissues that connect muscle to bone. When you have tennis elbow, inflammation affects the tendons that you use to bend your wrist and move your hand up. Inflammation occurs in the lower part of the upper arm bone (humerus), where the tendons connect to the bone (lateral epicondyle). Tennis elbow often affects people who play tennis, but anyone may get the condition from repeatedly extending the wrist or turning the forearm. What are the causes? This condition is usually caused by repeatedly extending the wrist, turning the forearm, and using the hands. It can result from sports or work that requires repetitive forearm movements. In some cases, it may be caused by a sudden injury. What increases the risk? You are more likely to develop tennis elbow if you play tennis or another racket sport. You also have a higher risk if you frequently use your hands for work. Besides people who play tennis, others at greater risk include:  Musicians.  Carpenters, painters, and plumbers.  Cooks.  Cashiers.  People who work in  factories.  Architect workers.  Butchers.  People who use computers. What are the signs or symptoms? Symptoms of this condition include:  Pain and tenderness in the forearm and the outer part of the elbow. Pain may be felt only when using the arm, or it may be there all the time.  A burning feeling that starts in the elbow and spreads down the forearm.  A weak grip in the hand. How is this diagnosed? This condition may be diagnosed based on:  Your symptoms and medical history.  A physical exam.  X-rays.  MRI. How is this treated? Resting and icing your arm is often the first treatment. Your health care provider may also recommend:  Medicines to reduce pain and inflammation. These may be in the form of a pill, topical gels, or shots of a steroid medicine  (cortisone).  An elbow strap to reduce stress on the area.  Physical therapy. This may include massage or exercises.  An elbow brace to restrict the movements that cause symptoms. If these treatments do not help relieve your symptoms, your health care provider may recommend surgery to remove damaged muscle and reattach healthy muscle to bone. Follow these instructions at home: Activity  Rest your elbow and wrist and avoid activities that cause symptoms, as told by your health care provider.  Do physical therapy exercises as instructed.  If you lift an object, lift it with your palm facing up. This reduces stress on your elbow. Lifestyle  If your tennis elbow is caused by sports, check your equipment and make sure that: ? You are using it correctly. ? It is the best fit for you.  If your tennis elbow is caused by work or computer use, take frequent breaks to stretch your arm. Talk with your manager about ways to manage your condition at work. If you have a brace:  Wear the brace or strap as told by your health care provider. Remove it only as told by your health care provider.  Loosen the brace if your fingers tingle, become numb, or turn cold and blue.  Keep the brace clean.  If the brace is not waterproof, ask if you may remove it for bathing. If you must keep the brace on while bathing: ? Do not let it get wet. ? Cover it with a watertight covering when you take a bath or a shower. General instructions   If directed, put ice on the painful area: ? Put ice in a plastic bag. ? Place a towel between your skin and the bag. ? Leave the ice on for 20 minutes, 2-3 times a day.  Take over-the-counter and prescription medicines only as told by your health care provider.  Keep all follow-up visits as told by your health care provider. This is important. Contact a health care provider if:  You have pain that gets worse or does not get better with treatment.  You have numbness  or weakness in your forearm, hand, or fingers. Summary  Tennis elbow (lateral epicondylitis) is inflammation of tendons in your outer forearm, near your elbow.  Common symptoms include pain and tenderness in your forearm and the outer part of your elbow.  This condition is usually caused by repeatedly extending your wrist, turning your forearm, and using your hands.  The first treatment is often resting and icing your arm to relieve symptoms. Further treatment may include taking medicine, getting physical therapy, wearing a brace or strap, or having surgery. This information is not intended  to replace advice given to you by your health care provider. Make sure you discuss any questions you have with your health care provider. Document Revised: 04/30/2018 Document Reviewed: 05/19/2017 Elsevier Patient Education  [place] [facility]bow  Golfer's elbow, also called medial epicondylitis, is a condition that results from inflammation of the strong bands of tissue (tendons) that attach your forearm muscles to the inside of your bone at the elbow. These tendons affect the muscles that bend the palm toward the wrist (flexion). The tendons become less flexible with age. This condition is called golfer's elbow because it is more common among people who constantly bend and twist their wrists, such as golfers. This injury is usually caused by overuse. What are the causes? This condition is caused by:  Repeatedly flexing, turning, or twisting your wrist.  Constantly gripping objects with your hands. What increases the risk? This condition is more likely to develop in people who play golf, baseball, or tennis. This injury is more common among people who have jobs that require the constant use of their hands, such as:  Carpenters.  Butchers.  Musicians.  Typists. What are the signs or symptoms? This condition causes elbow pain that may spread to your forearm and upper arm. Symptoms  of this condition include.  Pain at the inner elbow, forearm, or wrist.  Reduced grip strength. The pain may get worse when you bend your wrist downward. How is this diagnosed? This condition is diagnosed based on your symptoms, medical history, and a physical exam. During the exam, your health care provider may:  Test your grip strength.  Move your wrist to check for pain. You may also have an MRI to:  Confirm the diagnosis.  Look for other issues.  Check for tears in the ligaments, muscles, or tendons. How is this treated? Treatment for this condition includes:  Stopping all activities that make you bend or twist your elbow or wrist and waiting until your pain and other symptoms go away before resuming those activities.  Wearing an elbow brace or wrist splint to restrict the movements that cause symptoms.  Icing your inner elbow, forearm, or wrist to relieve pain.  Taking NSAIDs or getting corticosteroid injections to reduce pain and swelling.  Doing stretching, range-of-motion, and strengthening exercises (physical therapy) as told by your health care provider. In rare cases, surgery may be needed if your condition does not improve. Follow these instructions at home: If you have a brace or splint:  Wear it as told by your health care provider.  Loosen it if your fingers tingle, become numb, or turn cold and blue.  Keep it clean. Managing pain, stiffness, and swelling   If directed, put ice on the injured area. ? Put ice in a plastic bag. ? Place a towel between your skin and the bag. ? Leave the ice on for 20 minutes, 2-3 times a day.  Move your fingers often to avoid stiffness.  Raise (elevate) the injured area above the level of your heart while you are sitting or lying down. Activity  Rest your injured area as told by your health care provider.  Return to your normal activities as told by your health care provider. Ask your health care provider what  activities are safe for you.  Do exercises as told by your health care provider. Lifestyle  If your condition is caused by sports, work with a trainer to make sure that you: ? Have the correct technique. ? Are using  the proper equipment.  If your condition is work related, talk with your employer about changes that can be made. General instructions  Take over-the-counter and prescription medicines only as told by your health care provider.  Do not use any products that contain nicotine or tobacco, such as cigarettes, e-cigarettes, and chewing tobacco. If you need help quitting, ask your health care provider.  Keep all follow-up visits as told by your health care provider. This is important. How is this prevented?  Before and after activity: ? Warm up and stretch before being active. ? Cool down and stretch after being active. ? Give your body time to rest between periods of activity.  During activity: ? Make sure to use equipment that fits you. ? If you play golf, slow your golf swing to reduce shock in the arm when making contact with the ball.  Maintain physical fitness, including: ? Strength. ? Flexibility. ? Cardiovascular fitness. ? Endurance.  Do exercises to strengthen the forearm muscles. Contact a health care provider if:  Your pain does not improve or it gets worse.  You notice numbness in your hand. Get help right away if:  Your pain is severe.  You cannot move your wrist. Summary  Golfer's elbow, also called medial epicondylitis, is a condition that results from inflammation of the strong bands of tissue (tendons) that attach your forearm muscles to the inside of your bone at the elbow.  This injury usually results from overuse.  Symptoms of this condition include decreased grip strength and pain at the inner elbow, forearm, or wrist.  This injury is treated with rest, ice, medicines, physical therapy, and surgery as needed. This information is not  intended to replace advice given to you by your health care provider. Make sure you discuss any questions you have with your health care provider. Document Revised: 11/25/2018 Document Reviewed: 06/10/2018 Elsevier Patient Education  [place] Ask your health care provider which exercises are safe for you. Do exercises exactly as told by your health care provider and adjust them as directed. It is normal to feel mild stretching, pulling, tightness, or discomfort as you do these exercises. Stop right away if you feel sudden pain or your pain gets worse. Do not begin these exercises until told by your health care provider. Stretching and range-of-motion exercises These exercises warm up your muscles and joints and improve the movement and flexibility of your elbow. These exercises also help to relieve pain, numbness, and tingling. Wrist flexion, assisted  4. Straighten your left / right elbow in front of you with your palm facing down toward the floor. ? If told by your health care provider, bend your left / right elbow to a 90-degree angle (right angle) at your side. 5. With your other hand, gently push over the back of your left / right hand so your fingers point toward the floor (flexion). Stop when you feel a gentle stretch on the back of your forearm. 6. Hold this position for __________ seconds. Repeat __________ times. Complete this exercise __________ times a day. Wrist extension, assisted  4. Straighten your left / right elbow in front of you with your palm facing up toward the ceiling. ? If told by your health care provider, bend your left / right elbow to a 90-degree angle (right angle) at your side. 5. With your other hand, gently pull your left / right hand and fingers toward the floor (extension). Stop when you feel a gentle  stretch on the palm side of your forearm. 6. Hold this position for __________ seconds. Repeat __________ times. Complete this  exercise __________ times a day. Assisted forearm rotation, supination 5. Sit or stand with your left / right elbow bent to a 90-degree angle (right angle) at your side. 6. Using your uninjured hand, turn (rotate) your left / right palm up toward the ceiling (supination) until you feel a gentle stretch along the inside of your forearm. 7. Hold this position for __________ seconds. Repeat __________ times. Complete this exercise __________ times a day. Assisted forearm rotation, pronation 5. Sit or stand with your left / right elbow bent to a 90-degree angle (right angle) at your side. 6. Using your uninjured hand, rotate your left / right palm down toward the floor (pronation) until you feel a gentle stretch along the outside of your forearm. 7. Hold this position for __________ seconds. Repeat __________ times. Complete this exercise __________ times a day. Strengthening exercises These exercises build strength and endurance in your forearm and elbow. Endurance is the ability to use your muscles for a long time, even after they get tired. Radial deviation  6. Stand with a __________ weight or a hammer in your left / right hand. Or, sit while holding a rubber exercise band or tubing, with your left / right forearm supported on a table or countertop. ? If you are standing, position your forearm so that your thumb is facing forward. If you are sitting, position your forearm so that the thumb is facing the ceiling. This is the neutral position. 7. Raise your hand upward in front of you so your thumb moves toward the ceiling (radial deviation), or pull up on the rubber tubing. Keep your forearm and elbow still while you move your wrist only. 8. Hold this position for __________ seconds. 9. Slowly return to the starting position. Repeat __________ times. Complete this exercise __________ times a day. Wrist extension, eccentric 5. Sit with your left / right forearm palm-down and supported on a table  or other surface. Let your left / right wrist extend over the edge of the surface. 6. Hold a __________ weight or a piece of exercise band or tubing in your left / right hand. ? If using a rubber exercise band or tubing, hold the other end of the tubing with your other hand. 7. Use your uninjured hand to move your left / right hand up toward the ceiling. 8. Take your uninjured hand away and slowly return to the starting position using only your left / right hand. Lowering your arm under tension is called eccentric extension. Repeat __________ times. Complete this exercise __________ times a day. Wrist extension Do not do this exercise if it causes pain at the outside of your elbow. Only do this exercise once instructed by your health care provider. 6. Sit with your left / right forearm supported on a table or other surface and your palm turned down toward the floor. Let your left / right wrist extend over the edge of the surface. 7. Hold a __________ weight or a piece of rubber exercise band or tubing. ? If you are using a rubber exercise band or tubing, hold the band or tubing in place with your other hand to provide resistance. 8. Slowly bend your wrist so your hand moves up toward the ceiling (extension). Move only your wrist, keeping your forearm and elbow still. 9. Hold this position for __________ seconds. 10. Slowly return to the starting position.  Repeat __________ times. Complete this exercise __________ times a day. Forearm rotation, supination To do this exercise, you will need a lightweight hammer or rubber mallet. 6. Sit with your left / right forearm supported on a table or other surface. Bend your elbow to a 90-degree angle (right angle). Position your forearm so that your palm is facing down toward the floor, with your hand resting over the edge of the table. 7. Hold a hammer in your left / right hand. ? To make this exercise easier, hold the hammer near the head of the  hammer. ? To make this exercise harder, hold the hammer near the end of the handle. 8. Without moving your wrist or elbow, slowly rotate your forearm so your palm faces up toward the ceiling (supination). 9. Hold this position for __________ seconds. 10. Slowly return to the starting position. Repeat __________ times. Complete this exercise __________ times a day. Shoulder blade squeeze 1. Sit in a stable chair or stand with good posture. If you are sitting down, do not let your back touch the back of the chair. 2. Your arms should be at your sides with your elbows bent to a 90-degree angle (right angle). Position your forearms so that your thumbs are facing the ceiling (neutral position). 3. Without lifting your shoulders up, squeeze your shoulder blades tightly together. 4. Hold this position for __________ seconds. 5. Slowly release and return to the starting position. Repeat __________ times. Complete this exercise __________ times a day. This information is not intended to replace advice given to you by your health care provider. Make sure you discuss any questions you have with your health care provider. Document Revised: 11/25/2018 Document Reviewed: 09/28/2018 Elsevier Patient Education  [place].  Tendinitis  Tendinitis is inflammation of a tendon. A tendon is a strong cord of tissue that connects muscle to bone. Tendinitis can affect any tendon, but it most commonly affects the:  Shoulder tendon (rotator cuff).  Ankle tendon (Achilles tendon).  Elbow tendon (triceps tendon).  Tendons in the wrist. What are the causes? This condition may be caused by:  Overusing a tendon or muscle. This is common.  Age-related wear and tear.  Injury.  Inflammatory conditions, such as arthritis.  Certain medicines. What increases the risk? You are more likely to develop this condition if you do activities that involve the same movements over and over again (repetitive  motions). What are the signs or symptoms? Symptoms of this condition may include:  Pain.  Tenderness.  Mild swelling.  Decreased range of motion. How is this diagnosed? This condition is diagnosed with a physical exam. You may also have tests, such as:  Ultrasound. This uses sound waves to make an image of the inside of your body in the affected area.  MRI. How is this treated? This condition may be treated by resting, icing, applying pressure (compression), and raising (elevating) the affected area above the level of your heart. This is known as RICE therapy. Treatment may also include:  Medicines to help reduce inflammation or to help reduce pain.  Exercises or physical therapy to strengthen and stretch the tendon.  A brace or splint.  Surgery. This is rarely needed. Follow these instructions at home: If you have a splint or brace:  Wear the splint or brace as told by your health care provider. Remove it only as told by your health care provider.  Loosen the splint or brace if your fingers or toes tingle, become numb, or  turn cold and blue.  Keep the splint or brace clean.  If the splint or brace is not waterproof: ? Do not let it get wet. ? Cover it with a watertight covering when you take a bath or shower. Managing pain, stiffness, and swelling  If directed, put ice on the affected area. ? If you have a removable splint or brace, remove it as told by your health care provider. ? Put ice in a plastic bag. ? Place a towel between your skin and the bag. ? Leave the ice on for 20 minutes, 2-3 times a day.  Move the fingers or toes of the affected limb often, if this applies. This can help to prevent stiffness and lessen swelling.  If directed, raise (elevate) the affected area above the level of your heart while you are sitting or lying down.  If directed, apply heat to the affected area before you exercise. Use the heat source that your health care provider  recommends, such as a moist heat pack or a heating pad.     ? Place a towel between your skin and the heat source. ? Leave the heat on for 20-30 minutes. ? Remove the heat if your skin turns bright red. This is especially important if you are unable to feel pain, heat, or cold. You may have a greater risk of getting burned. Driving  Do not drive or use heavy machinery while taking prescription pain medicine.  Ask your health care provider when it is safe to drive if you have a splint or brace on any part of your arm or leg. Activity  Rest the affected area as told by your health care provider.  Return to your normal activities as told by your health care provider. Ask your health care provider what activities are safe for you.  Avoid using the affected area while you are experiencing symptoms of tendinitis.  Do exercises as told by your health care provider. General instructions  If you have a splint, do not put pressure on any part of the splint until it is fully hardened. This may take several hours.  Wear an elastic bandage or compression wrap only as told by your health care provider.  Take over-the-counter and prescription medicines only as told by your health care provider.  Keep all follow-up visits as told by your health care provider. This is important. Contact a health care provider if:  Your symptoms do not improve.  You develop new, unexplained problems, such as numbness in your hands. Summary  Tendinitis is inflammation of a tendon.  You are more likely to develop this condition if you do activities that involve the same movements over and over again.  This condition may be treated by resting, icing, applying pressure (compression), and elevating the area above the level of your heart. This is known as RICE therapy.  Avoid using the affected area while you are experiencing symptoms of tendinitis. This information is not intended to replace advice given to you  by your health care provider. Make sure you discuss any questions you have with your health care provider. Document Revised: 02/09/2018 Document Reviewed: 12/23/2017 Elsevier Patient Education  [employer].

## 2019-11-09 LAB — C-REACTIVE PROTEIN: CRP: <1 mg/dL (ref 0.5–20.0)

## 2019-11-09 LAB — SEDIMENTATION RATE: Sed Rate: 26 mm/hr — ABNORMAL HIGH (ref 0–20)

## 2019-11-10 ENCOUNTER — Encounter: Payer: Self-pay | Admitting: Internal Medicine

## 2019-11-10 LAB — ANTI-NUCLEAR AB-TITER (ANA TITER): ANA Titer 1: 1:80 {titer} — ABNORMAL HIGH

## 2019-11-10 LAB — ANA: Anti Nuclear Antibody (ANA): POSITIVE — AB

## 2019-11-10 LAB — CYCLIC CITRUL PEPTIDE ANTIBODY, IGG: Cyclic Citrullin Peptide Ab: <16 UNITS

## 2019-11-10 LAB — RHEUMATOID FACTOR: Rheumatoid fact SerPl-aCnc: <14 IU/mL (ref <14)

## 2019-11-11 ENCOUNTER — Encounter: Payer: Self-pay | Admitting: Internal Medicine

## 2019-11-13 DIAGNOSIS — N3001 Acute cystitis with hematuria: Secondary | ICD-10-CM | POA: Diagnosis not present

## 2019-11-13 DIAGNOSIS — R300 Dysuria: Secondary | ICD-10-CM | POA: Diagnosis not present

## 2019-11-14 ENCOUNTER — Telehealth: Payer: Self-pay | Admitting: Internal Medicine

## 2019-11-14 ENCOUNTER — Other Ambulatory Visit: Payer: Self-pay | Admitting: Internal Medicine

## 2019-11-14 DIAGNOSIS — E2839 Other primary ovarian failure: Secondary | ICD-10-CM

## 2019-11-14 DIAGNOSIS — Z8262 Family history of osteoporosis: Secondary | ICD-10-CM

## 2019-11-14 NOTE — Addendum Note (Signed)
Addended by: Orland Mustard on: 11/14/2019 01:37 PM   Modules accepted: Orders

## 2019-11-14 NOTE — Telephone Encounter (Signed)
Rheumatoid arthritis labs normal   Sed rate elevated which is nonspecific marker of inflammation not worried about this   +ANA this is autoimmune marker I.e lupus or arthritis  Titer 1:80 normal is <1:40 but this could be false + or elevation and the elevation is slight   To work this up and your joint pain I would consider rheumatology in [place] Dr. Amil Amen  -would you like a referral?

## 2019-11-14 NOTE — Telephone Encounter (Signed)
Please call [place] and get bone density sch for pt  If pt c/w cost she can discuss billing dept cost at [employer] [facility]

## 2019-11-16 ENCOUNTER — Encounter: Payer: Self-pay | Admitting: Internal Medicine

## 2019-11-17 DIAGNOSIS — R2231 Localized swelling, mass and lump, right upper limb: Secondary | ICD-10-CM | POA: Diagnosis not present

## 2019-11-22 ENCOUNTER — Encounter: Payer: Self-pay | Admitting: Internal Medicine

## 2019-12-09 DIAGNOSIS — D2111 Benign neoplasm of connective and other soft tissue of right upper limb, including shoulder: Secondary | ICD-10-CM | POA: Diagnosis not present

## 2019-12-09 DIAGNOSIS — R2231 Localized swelling, mass and lump, right upper limb: Secondary | ICD-10-CM | POA: Diagnosis not present

## 2019-12-09 DIAGNOSIS — D492 Neoplasm of unspecified behavior of bone, soft tissue, and skin: Secondary | ICD-10-CM | POA: Diagnosis not present

## 2019-12-09 DIAGNOSIS — D3612 Benign neoplasm of peripheral nerves and autonomic nervous system, upper limb, including shoulder: Secondary | ICD-10-CM | POA: Diagnosis not present

## 2020-01-11 DIAGNOSIS — R768 Other specified abnormal immunological findings in serum: Secondary | ICD-10-CM | POA: Diagnosis not present

## 2020-01-11 DIAGNOSIS — M2550 Pain in unspecified joint: Secondary | ICD-10-CM | POA: Diagnosis not present

## 2020-02-11 IMAGING — DX DG FINGER MIDDLE 2+V*R*
3 series · 3 of 3 positions shown · non-contrast
Comparison: None.

CLINICAL DATA: Pain and swelling involving the middle digit.
Evaluate for cyst or spur.

EXAM:
RIGHT MIDDLE FINGER 2+V

[finger pa]
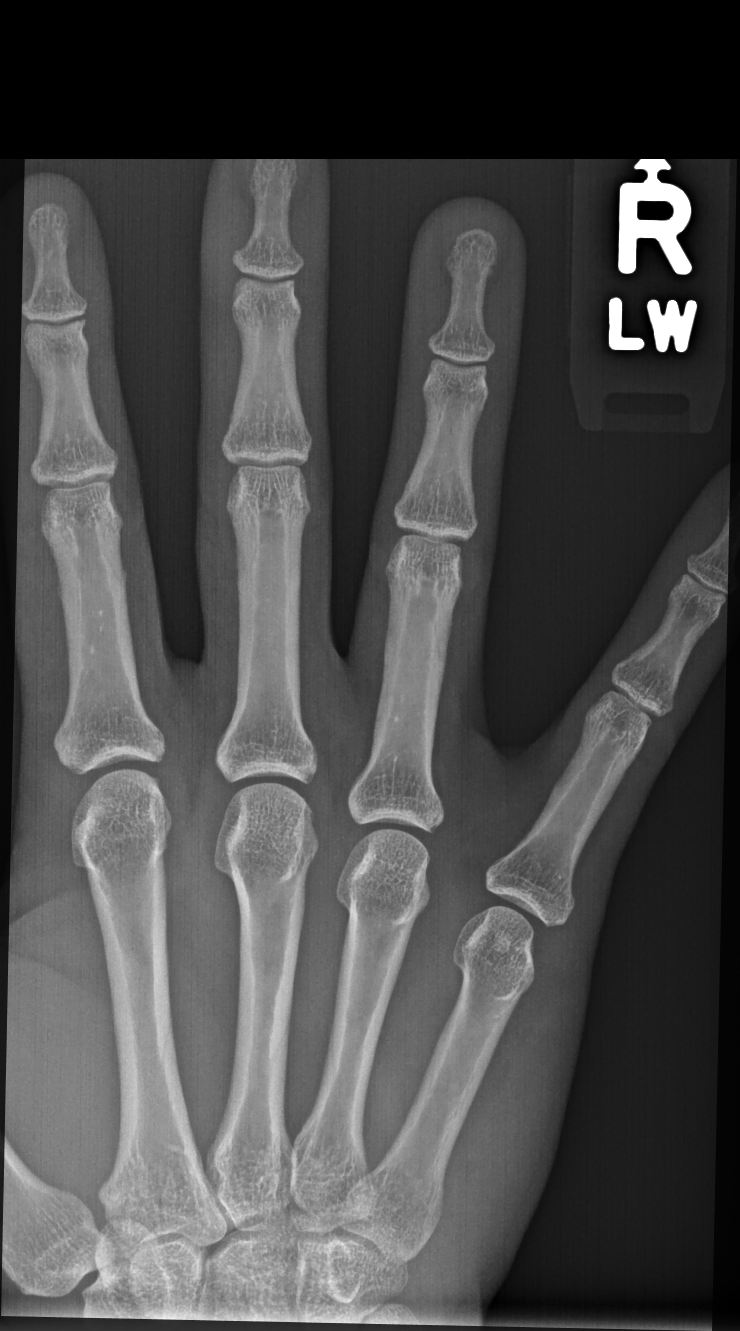

[finger obl (oblique)]
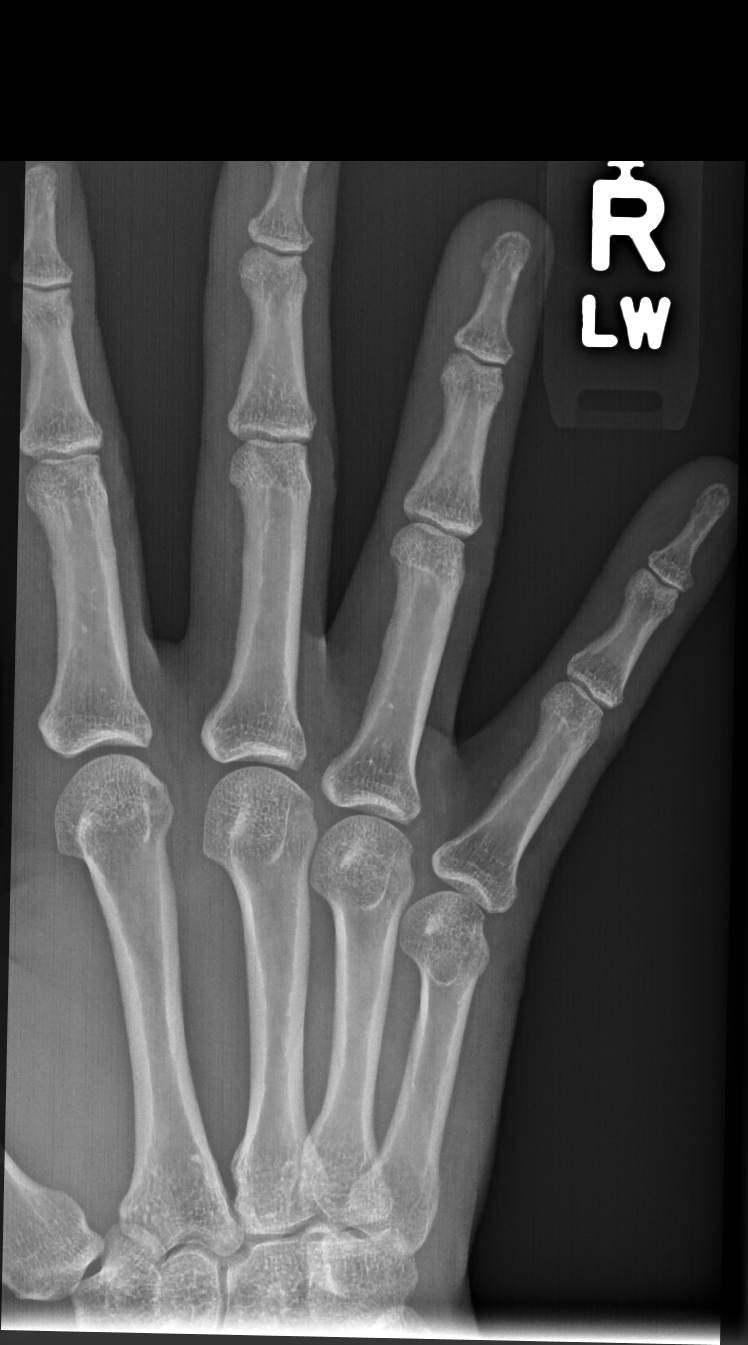

[finger lat]
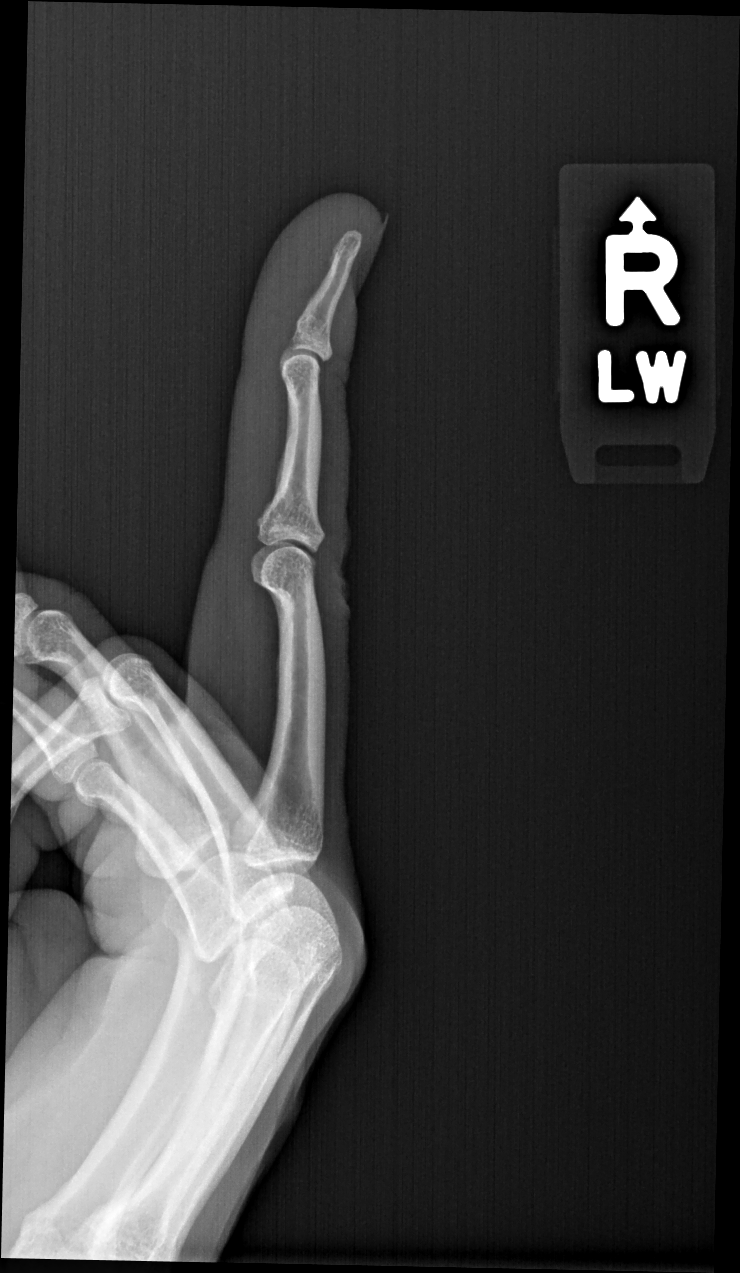

[3 of 3 positions shown; findings below may reference images not displayed]

FINDINGS: No fracture or dislocation. Joint spaces are preserved. No erosions.
Regional soft tissues appear normal. No radiopaque foreign body.
IMPRESSION: Unremarkable radiographs of the right middle digit.

## 2020-02-28 ENCOUNTER — Encounter: Payer: Self-pay | Admitting: Internal Medicine

## 2020-03-04 ENCOUNTER — Encounter: Payer: Self-pay | Admitting: Internal Medicine

## 2020-03-04 ENCOUNTER — Telehealth: Payer: Self-pay | Admitting: Internal Medicine

## 2020-03-04 NOTE — Telephone Encounter (Signed)
Hi Dr. Jacklynn Lewis.  I don't know if I should be seen or if you can just order some bloodwork?  I have been sweating a lot off and on, it maybe hormonal hot flashes not sure.  I still have my left ovary.  Also, been fatigued (maybe due to not sleeping well because of sweating throughout the night) and at times dizziness.  Not sure if you can have my hormone levels check or whatever you may see necessary.  Regards,  Valerie Colon    To above message schedule fasting labs in our office asap   Has she tried checking her blood sugar when dizzy and sweating? I would do this If not better and labs normal schedule visit   [place]

## 2020-03-04 NOTE — Addendum Note (Signed)
Addended by: Orland Mustard on: 03/04/2020 10:42 PM   Modules accepted: Orders

## 2020-03-05 NOTE — Telephone Encounter (Signed)
Spoke with the patient and she is scheduled for this coming Wednesday for 8:15 am for fasting labs.   Patient states she will look for her glucose meter to check her levels when she is feeling like this

## 2020-03-07 ENCOUNTER — Other Ambulatory Visit (INDEPENDENT_AMBULATORY_CARE_PROVIDER_SITE_OTHER): Payer: BC Managed Care – PPO

## 2020-03-07 ENCOUNTER — Other Ambulatory Visit: Payer: Self-pay

## 2020-03-07 DIAGNOSIS — E559 Vitamin D deficiency, unspecified: Secondary | ICD-10-CM

## 2020-03-07 DIAGNOSIS — Z13220 Encounter for screening for lipoid disorders: Secondary | ICD-10-CM | POA: Diagnosis not present

## 2020-03-07 DIAGNOSIS — R61 Generalized hyperhidrosis: Secondary | ICD-10-CM | POA: Diagnosis not present

## 2020-03-07 DIAGNOSIS — R5383 Other fatigue: Secondary | ICD-10-CM | POA: Diagnosis not present

## 2020-03-07 DIAGNOSIS — E611 Iron deficiency: Secondary | ICD-10-CM

## 2020-03-07 DIAGNOSIS — E538 Deficiency of other specified B group vitamins: Secondary | ICD-10-CM | POA: Diagnosis not present

## 2020-03-07 DIAGNOSIS — R232 Flushing: Secondary | ICD-10-CM

## 2020-03-07 LAB — URINALYSIS, ROUTINE W REFLEX MICROSCOPIC
Bilirubin Urine: NEGATIVE
Hgb urine dipstick: NEGATIVE
Ketones, ur: NEGATIVE
Leukocytes,Ua: NEGATIVE
Nitrite: NEGATIVE
RBC / HPF: NONE SEEN (ref 0–?)
Specific Gravity, Urine: 1.015 (ref 1.000–1.030)
Total Protein, Urine: NEGATIVE
Urine Glucose: NEGATIVE
Urobilinogen, UA: 0.2 (ref 0.0–1.0)
pH: 7 (ref 5.0–8.0)

## 2020-03-07 LAB — LIPID PANEL
Cholesterol: 193 mg/dL (ref 0–200)
HDL: 94.9 mg/dL (ref >39.00)
LDL Cholesterol: 87 mg/dL (ref 0–99)
NonHDL: 98.24
Total CHOL/HDL Ratio: 2
Triglycerides: 58 mg/dL (ref 0.0–149.0)
VLDL: 11.6 mg/dL (ref 0.0–40.0)

## 2020-03-07 LAB — COMPREHENSIVE METABOLIC PANEL
ALT: 22 U/L (ref 0–35)
AST: 30 U/L (ref 0–37)
Albumin: 4 g/dL (ref 3.5–5.2)
Alkaline Phosphatase: 59 U/L (ref 39–117)
BUN: 20 mg/dL (ref 6–23)
CO2: 31 mEq/L (ref 19–32)
Calcium: 9.5 mg/dL (ref 8.4–10.5)
Chloride: 102 mEq/L (ref 96–112)
Creatinine, Ser: 1.1 mg/dL (ref 0.40–1.20)
GFR: 64.03 mL/min (ref >60.00)
Glucose, Bld: 89 mg/dL (ref 70–99)
Potassium: 4.6 mEq/L (ref 3.5–5.1)
Sodium: 139 mEq/L (ref 135–145)
Total Bilirubin: 0.5 mg/dL (ref 0.2–1.2)
Total Protein: 7.2 g/dL (ref 6.0–8.3)

## 2020-03-07 LAB — CBC WITH DIFFERENTIAL/PLATELET
Basophils Absolute: 0 10*3/uL (ref 0.0–0.1)
Basophils Relative: 0.3 % (ref 0.0–3.0)
Eosinophils Absolute: 0.2 10*3/uL (ref 0.0–0.7)
Eosinophils Relative: 2.8 % (ref 0.0–5.0)
HCT: 38.8 % (ref 36.0–46.0)
Hemoglobin: 13.3 g/dL (ref 12.0–15.0)
Lymphocytes Relative: 36.8 % (ref 12.0–46.0)
Lymphs Abs: 2.2 10*3/uL (ref 0.7–4.0)
MCHC: 34.3 g/dL (ref 30.0–36.0)
MCV: 93.9 fl (ref 78.0–100.0)
Monocytes Absolute: 0.4 10*3/uL (ref 0.1–1.0)
Monocytes Relative: 6.1 % (ref 3.0–12.0)
Neutro Abs: 3.2 10*3/uL (ref 1.4–7.7)
Neutrophils Relative %: 54 % (ref 43.0–77.0)
Platelets: 230 10*3/uL (ref 150.0–400.0)
RBC: 4.13 Mil/uL (ref 3.87–5.11)
RDW: 14.3 % (ref 11.5–15.5)
WBC: 6 10*3/uL (ref 4.0–10.5)

## 2020-03-07 LAB — VITAMIN D 25 HYDROXY (VIT D DEFICIENCY, FRACTURES): VITD: 34.35 ng/mL (ref 30.00–100.00)

## 2020-03-07 LAB — VITAMIN B12: Vitamin B-12: >1526 pg/mL — ABNORMAL HIGH (ref 211–911)

## 2020-03-07 LAB — TSH: TSH: 0.83 u[IU]/mL (ref 0.35–4.50)

## 2020-03-07 LAB — T4, FREE: Free T4: 0.76 ng/dL (ref 0.60–1.60)

## 2020-03-07 LAB — FOLLICLE STIMULATING HORMONE: FSH: 114.4 m[IU]/mL

## 2020-03-08 LAB — IRON,TIBC AND FERRITIN PANEL
Ferritin: 90 ng/mL (ref 16–232)
Iron: 136 ug/dL (ref 40–190)

## 2020-03-29 ENCOUNTER — Other Ambulatory Visit: Payer: Self-pay | Admitting: Internal Medicine

## 2020-03-29 DIAGNOSIS — R6889 Other general symptoms and signs: Secondary | ICD-10-CM

## 2020-03-29 DIAGNOSIS — R232 Flushing: Secondary | ICD-10-CM

## 2020-04-09 NOTE — Addendum Note (Signed)
Addended by: Orland Mustard on: 04/09/2020 09:29 AM   Modules accepted: Orders

## 2020-05-02 NOTE — Addendum Note (Signed)
Addended by: Orland Mustard on: 05/02/2020 07:28 AM   Modules accepted: Orders

## 2020-05-11 ENCOUNTER — Ambulatory Visit: Payer: BC Managed Care – PPO | Admitting: Internal Medicine

## 2020-05-16 ENCOUNTER — Encounter: Payer: Self-pay | Admitting: Internal Medicine

## 2020-05-16 ENCOUNTER — Other Ambulatory Visit: Payer: Self-pay

## 2020-05-16 ENCOUNTER — Ambulatory Visit (INDEPENDENT_AMBULATORY_CARE_PROVIDER_SITE_OTHER): Payer: BC Managed Care – PPO | Admitting: Internal Medicine

## 2020-05-16 VITALS — BP 116/70 | HR 81 | Temp 98.0°F | Ht 65.0 in | Wt 179.0 lb

## 2020-05-16 DIAGNOSIS — R768 Other specified abnormal immunological findings in serum: Secondary | ICD-10-CM

## 2020-05-16 DIAGNOSIS — Z0000 Encounter for general adult medical examination without abnormal findings: Secondary | ICD-10-CM | POA: Diagnosis not present

## 2020-05-16 DIAGNOSIS — R600 Localized edema: Secondary | ICD-10-CM | POA: Diagnosis not present

## 2020-05-16 DIAGNOSIS — K449 Diaphragmatic hernia without obstruction or gangrene: Secondary | ICD-10-CM | POA: Insufficient documentation

## 2020-05-16 DIAGNOSIS — Z23 Encounter for immunization: Secondary | ICD-10-CM

## 2020-05-16 DIAGNOSIS — R1900 Intra-abdominal and pelvic swelling, mass and lump, unspecified site: Secondary | ICD-10-CM | POA: Diagnosis not present

## 2020-05-16 DIAGNOSIS — N200 Calculus of kidney: Secondary | ICD-10-CM

## 2020-05-16 DIAGNOSIS — N281 Cyst of kidney, acquired: Secondary | ICD-10-CM

## 2020-05-16 DIAGNOSIS — N83202 Unspecified ovarian cyst, left side: Secondary | ICD-10-CM

## 2020-05-16 DIAGNOSIS — R7689 Other specified abnormal immunological findings in serum: Secondary | ICD-10-CM

## 2020-05-16 HISTORY — DX: Other specified abnormal immunological findings in serum: R76.8

## 2020-05-16 HISTORY — DX: Cyst of kidney, acquired: N28.1

## 2020-05-16 MED ORDER — FUROSEMIDE 20 MG PO TABS: 20.0000 mg | ORAL_TABLET | Freq: Every day | ORAL | 0 refills | Status: DC | PRN

## 2020-05-16 NOTE — Patient Instructions (Addendum)
Results for Valerie Colon, Valerie Colon (MRN [id number]) as of 05/16/2020 15:24  Ref. Range 10/28/2018 13:48 12/03/2018 08:49 05/06/2019 15:50 03/07/2020 08:17  TSH Latest Ref Range: 0.35 - 4.50 uIU/mL <0.01 (L) 0.66  0.83  T4,Free(Direct) Latest Ref Range: 0.60 - 1.60 ng/dL    0.76  Thyroxine (T4) Latest Ref Range: 5.1 - 11.9 mcg/dL 1.9 (L) 5.7    Free Thyroxine Index Latest Ref Range: 1.4 - 3.8  0.6 (L) 2.1    T3 Uptake Latest Ref Range: 22 - 35 % 32 37 (H)     Results for Valerie Colon, Valerie Colon (MRN [id number]) as of 05/16/2020 15:24  Ref. Range 09/30/2018 07:30 10/28/2018 13:48 12/03/2018 08:49 05/06/2019 15:50 03/07/2020 08:17  Glucose Latest Ref Range: 70 - 99 mg/dL 114 (H) 46 (LL) 68 (L) 62 (L) 89   Results for Valerie Colon, Valerie Colon (MRN [id number]) as of 05/16/2020 15:25  Ref. Range 02/26/2018 08:38  Cortisol, Plasma Latest Units: ug/dL 7.2   Do you need thyroid ultrasound ? What about blood sugar dropping after weight loss surgery?    Peripheral Edema  Peripheral edema is swelling that is caused by a buildup of fluid. Peripheral edema most often affects the lower legs, ankles, and feet. It can also develop in the arms, hands, and face. The area of the body that has peripheral edema will look swollen. It may also feel heavy or warm. Your clothes may start to feel tight. Pressing on the area may make a temporary dent in your skin. You may not be able to move your swollen arm or leg as much as usual. There are many causes of peripheral edema. It can happen because of a complication of other conditions such as congestive heart failure, kidney disease, or a problem with your blood circulation. It also can be a side effect of certain medicines or because of an infection. It often happens to women during pregnancy. Sometimes, the cause is not known. Follow these instructions at home: Managing pain, stiffness, and swelling   Raise (elevate) your legs while you are sitting or lying down.  Move around often to prevent  stiffness and to lessen swelling.  Do not sit or stand for long periods of time.  Wear support stockings as told by your health care provider. Medicines  Take over-the-counter and prescription medicines only as told by your health care provider.  Your health care provider may prescribe medicine to help your body get rid of excess water (diuretic). General instructions  Pay attention to any changes in your symptoms.  Follow instructions from your health care provider about limiting salt (sodium) in your diet. Sometimes, eating less salt may reduce swelling.  Moisturize skin daily to help prevent skin from cracking and draining.  Keep all follow-up visits as told by your health care provider. This is important. Contact a health care provider if you have:  A fever.  Edema that starts suddenly or is getting worse, especially if you are pregnant or have a medical condition.  Swelling in only one leg.  Increased swelling, redness, or pain in one or both of your legs.  Drainage or sores at the area where you have edema. Get help right away if you:  Develop shortness of breath, especially when you are lying down.  Have pain in your chest or abdomen.  Feel weak.  Feel faint. Summary  Peripheral edema is swelling that is caused by a buildup of fluid. Peripheral edema most often affects the lower legs, ankles, and feet.  Move around often to prevent stiffness and to lessen swelling. Do not sit or stand for long periods of time.  Pay attention to any changes in your symptoms.  Contact a health care provider if you have edema that starts suddenly or is getting worse, especially if you are pregnant or have a medical condition.  Get help right away if you develop shortness of breath, especially when lying down. This information is not intended to replace advice given to you by your health care provider. Make sure you discuss any questions you have with your health care  provider. Document Revised: 04/28/2018 Document Reviewed: 04/28/2018 Elsevier Patient Education  2020 [employer].  Menopause Menopause is the normal time of life when menstrual periods stop completely. It is usually confirmed by 12 months without a menstrual period. The transition to menopause (perimenopause) most often happens between the ages of 26 and 74. During perimenopause, hormone levels change in your body, which can cause symptoms and affect your health. Menopause may increase your risk for:  Loss of bone (osteoporosis), which causes bone breaks (fractures).  Depression.  Hardening and narrowing of the arteries (atherosclerosis), which can cause heart attacks and strokes. What are the causes? This condition is usually caused by a natural change in hormone levels that happens as you get older. The condition may also be caused by surgery to remove both ovaries (bilateral oophorectomy). What increases the risk? This condition is more likely to start at an earlier age if you have certain medical conditions or treatments, including:  A tumor of the pituitary gland in the brain.  A disease that affects the ovaries and hormone production.  Radiation treatment for cancer.  Certain cancer treatments, such as chemotherapy or hormone (anti-estrogen) therapy.  Heavy smoking and excessive alcohol use.  Family history of early menopause. This condition is also more likely to develop earlier in women who are very thin. What are the signs or symptoms? Symptoms of this condition include:  Hot flashes.  Irregular menstrual periods.  Night sweats.  Changes in feelings about sex. This could be a decrease in sex drive or an increased comfort around your sexuality.  Vaginal dryness and thinning of the vaginal walls. This may cause painful intercourse.  Dryness of the skin and development of wrinkles.  Headaches.  Problems sleeping (insomnia).  Mood swings or  irritability.  Memory problems.  Weight gain.  Hair growth on the face and chest.  Bladder infections or problems with urinating. How is this diagnosed? This condition is diagnosed based on your medical history, a physical exam, your age, your menstrual history, and your symptoms. Hormone tests may also be done. How is this treated? In some cases, no treatment is needed. You and your health care provider should make a decision together about whether treatment is necessary. Treatment will be based on your individual condition and preferences. Treatment for this condition focuses on managing symptoms. Treatment may include:  Menopausal hormone therapy (MHT).  Medicines to treat specific symptoms or complications.  Acupuncture.  Vitamin or herbal supplements. Before starting treatment, make sure to let your health care provider know if you have a personal or family history of:  Heart disease.  Breast cancer.  Blood clots.  Diabetes.  Osteoporosis. Follow these instructions at home: Lifestyle  Do not use any products that contain nicotine or tobacco, such as cigarettes and e-cigarettes. If you need help quitting, ask your health care provider.  Get at least 30 minutes of physical activity on 5  or more days each week.  Avoid alcoholic and caffeinated beverages, as well as spicy foods. This may help prevent hot flashes.  Get 7-8 hours of sleep each night.  If you have hot flashes, try: ? Dressing in layers. ? Avoiding things that may trigger hot flashes, such as spicy food, warm places, or stress. ? Taking slow, deep breaths when a hot flash starts. ? Keeping a fan in your home and office.  Find ways to manage stress, such as deep breathing, meditation, or journaling.  Consider going to group therapy with other women who are having menopause symptoms. Ask your health care provider about recommended group therapy meetings. Eating and drinking  Eat a healthy, balanced  diet that contains whole grains, lean protein, low-fat dairy, and plenty of fruits and vegetables.  Your health care provider may recommend adding more soy to your diet. Foods that contain soy include tofu, tempeh, and soy milk.  Eat plenty of foods that contain calcium and vitamin D for bone health. Items that are rich in calcium include low-fat milk, yogurt, beans, almonds, sardines, broccoli, and kale. Medicines  Take over-the-counter and prescription medicines only as told by your health care provider.  Talk with your health care provider before starting any herbal supplements. If prescribed, take vitamins and supplements as told by your health care provider. These may include: ? Calcium. Women age 45 and older should get 1,200 mg (milligrams) of calcium every day. ? Vitamin D. Women need 600-800 International Units of vitamin D each day. ? Vitamins B12 and B6. Aim for 50 micrograms of B12 and 1.5 mg of B6 each day. General instructions  Keep track of your menstrual periods, including: ? When they occur. ? How heavy they are and how long they last. ? How much time passes between periods.  Keep track of your symptoms, noting when they start, how often you have them, and how long they last.  Use vaginal lubricants or moisturizers to help with vaginal dryness and improve comfort during sex.  Keep all follow-up visits as told by your health care provider. This is important. This includes any group therapy or counseling. Contact a health care provider if:  You are still having menstrual periods after age 10.  You have pain during sex.  You have not had a period for 12 months and you develop vaginal bleeding. Get help right away if:  You have: ? Severe depression. ? Excessive vaginal bleeding. ? Pain when you urinate. ? A fast or irregular heart beat (palpitations). ? Severe headaches. ? Abdomen (abdominal) pain or severe indigestion.  You fell and you think you have a broken  bone.  You develop leg or chest pain.  You develop vision problems.  You feel a lump in your breast. Summary  Menopause is the normal time of life when menstrual periods stop completely. It is usually confirmed by 12 months without a menstrual period.  The transition to menopause (perimenopause) most often happens between the ages of 83 and 71.  Symptoms can be managed through medicines, lifestyle changes, and complementary therapies such as acupuncture.  Eat a balanced diet that is rich in nutrients to promote bone health and heart health and to manage symptoms during menopause. This information is not intended to replace advice given to you by your health care provider. Make sure you discuss any questions you have with your health care provider. Document Revised: 07/17/2017 Document Reviewed: 09/06/2016 Elsevier Patient Education  2020 [employer].

## 2020-05-23 ENCOUNTER — Other Ambulatory Visit: Payer: Self-pay

## 2020-05-23 ENCOUNTER — Emergency Department: Payer: BC Managed Care – PPO

## 2020-05-23 ENCOUNTER — Emergency Department
Admission: EM | Admit: 2020-05-23 | Discharge: 2020-05-23 | Disposition: A | Discharge status: Home / Self Care | Payer: BC Managed Care – PPO | Attending: Emergency Medicine | Admitting: Emergency Medicine

## 2020-05-23 ENCOUNTER — Telehealth: Payer: Self-pay | Admitting: Physician Assistant

## 2020-05-23 ENCOUNTER — Encounter: Payer: Self-pay | Admitting: Internal Medicine

## 2020-05-23 ENCOUNTER — Encounter: Payer: Self-pay | Admitting: Intensive Care

## 2020-05-23 DIAGNOSIS — R1032 Left lower quadrant pain: Secondary | ICD-10-CM | POA: Diagnosis not present

## 2020-05-23 DIAGNOSIS — R102 Pelvic and perineal pain: Secondary | ICD-10-CM

## 2020-05-23 DIAGNOSIS — N83292 Other ovarian cyst, left side: Secondary | ICD-10-CM | POA: Diagnosis not present

## 2020-05-23 DIAGNOSIS — N83202 Unspecified ovarian cyst, left side: Secondary | ICD-10-CM

## 2020-05-23 DIAGNOSIS — Z90710 Acquired absence of both cervix and uterus: Secondary | ICD-10-CM | POA: Diagnosis not present

## 2020-05-23 LAB — URINALYSIS, COMPLETE (UACMP) WITH MICROSCOPIC
Bilirubin Urine: NEGATIVE
Glucose, UA: >=500 mg/dL — AB
Hgb urine dipstick: NEGATIVE
Ketones, ur: NEGATIVE mg/dL
Leukocytes,Ua: NEGATIVE
Nitrite: NEGATIVE
Protein, ur: NEGATIVE mg/dL
Specific Gravity, Urine: 1.025 (ref 1.005–1.030)
pH: 5 (ref 5.0–8.0)

## 2020-05-23 LAB — GLUCOSE, CAPILLARY: Glucose-Capillary: 70 mg/dL (ref 70–99)

## 2020-05-23 LAB — PREGNANCY, URINE: Preg Test, Ur: NEGATIVE

## 2020-05-23 MED ORDER — HYDROCODONE-ACETAMINOPHEN 5-325 MG PO TABS
1.0000 | ORAL_TABLET | Freq: Four times a day (QID) | ORAL | 0 refills | Status: DC | PRN
Start: 2020-05-23 — End: 2020-05-23

## 2020-05-23 MED ORDER — HYDROCODONE-ACETAMINOPHEN 5-325 MG PO TABS: 1.0000 | ORAL_TABLET | Freq: Four times a day (QID) | ORAL | 0 refills | Status: DC | PRN

## 2020-05-23 NOTE — ED Provider Notes (Signed)
[facility] Emergency Department Provider Note  ____________________________________________   First MD Initiated Contact with Patient 05/23/20 1131     (approximate)  I have reviewed the triage vital signs and the nursing notes.   HISTORY  Chief Complaint Pelvic Pain    HPI Valerie Colon is a 49 y.o. female presents emergency department complaint of pelvic pain.  States it wraps around the left side of her back.  History of ovarian cyst on the left side along with kidney stones.  No vaginal discharge.  States she has noticed that her urine was darker than normal.  Some nausea with the pain.  No vomiting or diarrhea.    Past Medical History:  Diagnosis Date  . Acid reflux   . Allergic rhinitis   . Allergy   . Anxiety   . Heart murmur   . History of hiatal hernia   . Kidney stones   . Migraine   . Ovarian cyst, left     Patient Active Problem List   Diagnosis Date Noted  . Annual physical exam 05/06/2019  . Family history of osteoporosis 05/07/2018  . Cyst of left ovary 05/07/2018  . Cardiac murmur 02/12/2018  . Postprandial hypoglycemia 02/12/2018  . 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 Epigastric abdominal pain 11/13/2017  . Hypotension 11/13/2017  . Chronic gastritis 10/09/2017  . Migraine headache without aura 10/09/2017  . Soft tissue lesion of elbow region 10/09/2017  . Chronic pain of both knees 10/09/2017  . Shoulder pain, right 10/09/2017  . Pelvic pain 10/09/2017  . Breast calcifications 10/09/2017  . Increased frequency of headaches 10/02/2017  . Snoring 07/31/2017  . Sprain of ligament of tarsometatarsal joint 04/01/2016  . Nephrolithiasis 02/29/2016  . Chronic fatigue 01/24/2016  . Dizziness 01/24/2016  . Gastroesophageal reflux disease without esophagitis 01/24/2016  . GAD (generalized anxiety disorder) 11/20/2015  . Emotional stress 11/09/2015  . Intermittent palpitations 11/09/2015    . Vitamin D deficiency 07/09/2015  . Allergic rhinitis 03/29/2015  . Chronic anemia 01/08/2015  . Prediabetes 01/08/2015  . Obstructive apnea 01/20/2014  . Hypoglycemia after GI (gastrointestinal) surgery 11/26/2012    Past Surgical History:  Procedure Laterality Date  . ABDOMINAL HYSTERECTOMY     2008; left ovary still intact  . BREAST BIOPSY Right 12/19/2013   stereo right- neg  . CHOLECYSTECTOMY    . COLONOSCOPY WITH PROPOFOL N/A 05/05/2016   Procedure: COLONOSCOPY WITH PROPOFOL;  Surgeon: Lollie Sails, MD;  Location: [facility] ENDOSCOPY;  Service: Endoscopy;  Laterality: N/A;  . ESOPHAGOGASTRODUODENOSCOPY (EGD) WITH PROPOFOL N/A 05/05/2016   Procedure: ESOPHAGOGASTRODUODENOSCOPY (EGD) WITH PROPOFOL;  Surgeon: Lollie Sails, MD;  Location: [facility] ENDOSCOPY;  Service: Endoscopy;  Laterality: N/A;  . EXTRACORPOREAL SHOCK WAVE LITHOTRIPSY Left 11/06/2016   Procedure: EXTRACORPOREAL SHOCK WAVE LITHOTRIPSY (ESWL);  Surgeon: Nickie Retort, MD;  Location: [facility] ORS;  Service: Urology;  Laterality: Left;  . gallbladder  2012  . GASTRIC BYPASS  2012  . REDUCTION MAMMAPLASTY Bilateral    2010    Prior to Admission medications   Medication Sig Start Date End Date Taking? Authorizing Provider  Cyanocobalamin (RA VITAMIN B-12 TR) 1000 MCG TBCR Take by mouth.    [provider]  ferrous sulfate 325 (65 FE) MG EC tablet Take 325 mg by mouth 3 (three) times daily with meals.    [provider]  fluticasone (FLONASE) 50 MCG/ACT nasal spray Place 1 spray into both nostrils daily.  [provider]  furosemide (LASIX) 20 MG tablet Take 1 tablet (20 mg total) by mouth daily as needed. 05/16/20   McLean-Scocuzza, Nino Glow, MD  HYDROcodone-acetaminophen (NORCO/VICODIN) 5-325 MG tablet Take 1 tablet by mouth every 6 (six) hours as needed for moderate pain. 05/23/20   Caryn Section Linden Dolin, PA-C  Magnesium 400 MG CAPS Take by mouth.    [provider]  Multiple Vitamin  (MULTIVITAMIN) tablet Take 1 tablet by mouth daily.    [provider]    Allergies Patient has no known allergies.  Family History  Problem Relation Age of Onset  . Prostate cancer Father   . Hyperlipidemia Father   . Hypertension Father   . Depression Father   . Kidney Stones Maternal Grandfather   . Stroke Maternal Grandfather   . Cancer Maternal Grandfather        prostate cancer   . Diabetes Mother   . Hyperlipidemia Mother   . Hypertension Mother   . Osteoporosis Mother        ? if has this   . Multiple sclerosis Sister   . Heart disease Maternal Grandmother   . Cancer Other        breast cancer   . Breast cancer Other   . Osteoporosis Maternal Aunt   . Kidney disease Neg Hx   . Bladder Cancer Neg Hx     Social History Social History   Tobacco Use  . Smoking status: Never Smoker  . Smokeless tobacco: Never Used  Substance Use Topics  . Alcohol use: No    Alcohol/week: 0.0 standard drinks  . Drug use: No    Review of Systems  Constitutional: No fever/chills Eyes: No visual changes. ENT: No sore throat. Respiratory: Denies cough Cardiovascular: Denies chest pain Gastrointestinal: Denies abdominal pain Genitourinary: Negative for dysuria.  Positive pelvic pain Musculoskeletal: Negative for back pain. Skin: Negative for rash. Psychiatric: no mood changes,     ____________________________________________   PHYSICAL EXAM:  VITAL SIGNS: ED Triage Vitals [05/23/20 1119]  Enc Vitals Group     BP 122/72     Pulse Rate 74     Resp 16     Temp 98.4 F (36.9 C)     Temp Source Oral     SpO2 100 %     Weight 178 lb (80.7 kg)     Height 5\' 5"  (1.651 m)     Head Circumference      Peak Flow      Pain Score 5     Pain Loc      Pain Edu?      Excl. in [place]?     Constitutional: Alert and oriented. Well appearing and in no acute distress. Eyes: Conjunctivae are normal.  Head: Atraumatic. Nose: No congestion/rhinnorhea. Mouth/Throat:  Mucous membranes are moist.   Neck:  supple no lymphadenopathy noted Cardiovascular: Normal rate, regular rhythm. Respiratory: Normal respiratory effort.  No retractions,  Abd: soft nontender bs normal all 4 quad GU: deferred Musculoskeletal: FROM all extremities, warm and well perfused Neurologic:  Normal speech and language.  Skin:  Skin is warm, dry and intact. No rash noted. Psychiatric: Mood and affect are normal. Speech and behavior are normal.  ____________________________________________   LABS (all labs ordered are listed, but only abnormal results are displayed)  Labs Reviewed  URINALYSIS, COMPLETE (UACMP) WITH MICROSCOPIC - Abnormal; Notable for the following components:      Result Value   Color, Urine YELLOW (*)  APPearance HAZY (*)    Glucose, UA >=500 (*)    Bacteria, UA RARE (*)    All other components within normal limits  GLUCOSE, CAPILLARY  PREGNANCY, URINE  CBG MONITORING, ED   ____________________________________________   ____________________________________________  RADIOLOGY  Ultrasound pelvis shows hemorrhagic cyst of the left  ____________________________________________   PROCEDURES  Procedure(s) performed: No  Procedures    ____________________________________________   INITIAL IMPRESSION / ASSESSMENT AND PLAN / ED COURSE  Pertinent labs & imaging results that were available during my care of the patient were reviewed by me and considered in my medical decision making (see chart for details).   Patient is a 49 year old female presents emergency department with left lower quadrant pain radiating into the pelvis.  Concerns for ovarian cyst versus stone.  Exam is benign.  UA shows greater than 500 glucose and no RBCs  Ultrasound of the pelvis to rule out ovarian cyst/torsion   Ultrasound shows a hemorrhagic cyst on the left ovary.  I did explain findings to patient.  She is given a prescription for pain medication.  She is to  follow-up with her regular doctor/GYN if not improving in 2 to 3 days.  Return emergency department worsening.  Discharged in stable condition.  Valerie Colon was evaluated in Emergency Department on 05/23/2020 for the symptoms described in the history of present illness. She was evaluated in the context of the global COVID-19 pandemic, which necessitated consideration that the patient might be at risk for infection with the SARS-CoV-2 virus that causes COVID-19. Institutional protocols and algorithms that pertain to the evaluation of patients at risk for COVID-19 are in a state of rapid change based on information released by regulatory bodies including the CDC and federal and state organizations. These policies and algorithms were followed during the patient's care in the ED.    As part of my medical decision making, I reviewed the following data within the [place] notes reviewed and incorporated, Labs reviewed , Old chart reviewed, Radiograph reviewed , Notes from prior ED visits and [place] Controlled Substance Database  ____________________________________________   FINAL CLINICAL IMPRESSION(S) / ED DIAGNOSES  Final diagnoses:  Pelvic pain  Hemorrhagic cyst of left ovary      NEW MEDICATIONS STARTED DURING THIS VISIT:  Discharge Medication List as of 05/23/2020  4:07 PM    START taking these medications   Details  HYDROcodone-acetaminophen (NORCO/VICODIN) 5-325 MG tablet Take 1 tablet by mouth every 6 (six) hours as needed for moderate pain., Starting Wed 05/23/2020, Normal         Note:  This document was prepared using Dragon voice recognition software and may include unintentional dictation errors.    Versie Starks, PA-C 05/23/20 1702    Harvest Dark, MD 05/25/20 2235

## 2020-05-23 NOTE — Discharge Instructions (Signed)
Follow-up with your regular doctor as needed.  Return emergency department worsening.  Take pain medication as needed for pain.  You may also take ibuprofen.

## 2020-05-23 NOTE — ED Triage Notes (Signed)
Patient c/o pelvic pain that started this AM. Reports taking awhile to start urinating but then no problems. Pt states "I think it is a kidney stone." NAD noted.

## 2020-05-23 NOTE — ED Notes (Signed)
Pt states she has h/o kidney stones.  Pain is in the lower abdomin and is wrapping around to the left side of her back.  Pt denies any vaginal discharge, difficulty urinating, or burning.  Pt states pain started this morning and she also started having some nausea with it.

## 2020-05-23 NOTE — Telephone Encounter (Cosign Needed)
Prescription for Vicodin did not send to the pharmacy.  We will try to print at this time.

## 2020-05-23 NOTE — Telephone Encounter (Cosign Needed)
Patient's prescription did not transmit the prescription.  Had to physically write the prescription.  Rx for Vicodin No. 15 no refill

## 2020-05-23 NOTE — Telephone Encounter (Cosign Needed)
Reordered as prescription did not go to pharmacy or print on previous note.

## 2020-05-25 ENCOUNTER — Other Ambulatory Visit: Payer: Self-pay

## 2020-05-25 ENCOUNTER — Ambulatory Visit
Admission: RE | Admit: 2020-05-25 | Discharge: 2020-05-25 | Disposition: A | Discharge status: Home / Self Care | Payer: BC Managed Care – PPO | Source: Ambulatory Visit | Attending: Internal Medicine | Admitting: Internal Medicine

## 2020-05-25 ENCOUNTER — Ambulatory Visit: Payer: BC Managed Care – PPO

## 2020-05-25 DIAGNOSIS — R1900 Intra-abdominal and pelvic swelling, mass and lump, unspecified site: Secondary | ICD-10-CM

## 2020-06-04 ENCOUNTER — Telehealth: Payer: Self-pay | Admitting: Internal Medicine

## 2020-06-04 NOTE — Telephone Encounter (Signed)
What is the status if endocrine referral?

## 2020-06-05 NOTE — Telephone Encounter (Signed)
Good afternoon!  Pt is scheduled on 06/29/2020 at 1:15 pm

## 2020-06-07 DIAGNOSIS — N83202 Unspecified ovarian cyst, left side: Secondary | ICD-10-CM | POA: Diagnosis not present

## 2020-06-07 DIAGNOSIS — R102 Pelvic and perineal pain: Secondary | ICD-10-CM | POA: Diagnosis not present

## 2020-06-15 ENCOUNTER — Ambulatory Visit
Admission: RE | Admit: 2020-06-15 | Discharge: 2020-06-15 | Disposition: A | Discharge status: Home / Self Care | Payer: BC Managed Care – PPO | Source: Ambulatory Visit | Attending: Internal Medicine | Admitting: Internal Medicine

## 2020-06-15 ENCOUNTER — Other Ambulatory Visit: Payer: Self-pay

## 2020-06-15 DIAGNOSIS — Z1231 Encounter for screening mammogram for malignant neoplasm of breast: Secondary | ICD-10-CM

## 2020-06-15 NOTE — Progress Notes (Addendum)
Chief Complaint  Patient presents with  . Follow-up  . Immunizations    flu shot   Office visit 05/16/20/Annual  1. Flu shot wanted  2. ED visit [facility] 05/23/20 with hemorrhagic cyst of left ovary s/p hysterectomy will f/u with ob/gyn she had been complaining of abdominal swelling down to legs/thighs intermittently. She drinks almost 1 gallon of water per day and there is no prediction of when swelling will occur but not present today. Pt states this is 4th or 5th time this has happened w/in the 1.5 years 3. Hypoglycemia s/p gastric weight loss surgery appt [facility] 06/2020 to f/u and consider thyroid US with h/o abnormal tfts Results for Valerie, Colon (MRN [id number]) as of 06/15/2020 07:01  10/28/2018 13:48 TSH: <0.01 (L) Thyroxine (T4): 1.9 (L) Free Thyroxine Index: 0.6 (L) T3 Uptake: 32  12/03/2018 08:49 TSH: 0.66 Thyroxine (T4): 5.7 Free Thyroxine Index: 2.1 T3 Uptake: 37 (H)  05/06/2019 15:50  03/07/2020 08:17 TSH: 0.83 T4,Free(Direct): 0.76  4. Need to get notes Dr. Amil Amen rheumatology seen in the past for elevated ANA obtained notes and ENA panel negative and negative autoimmune etiology as of 2021  She does c/o hip and knee pain and will f/u [facility] established   Review of Systems  Constitutional:       Denies night sweats   HENT: Negative for hearing loss.   Eyes: Negative for blurred vision.  Respiratory: Negative for shortness of breath.   Cardiovascular: Negative for chest pain.  Gastrointestinal: Negative for abdominal pain.  Musculoskeletal: Positive for joint pain.  Skin: Negative for rash.  Neurological: Negative for dizziness and headaches.  Endo/Heme/Allergies:       Denies low sugars   Psychiatric/Behavioral: Negative for depression. The patient is not nervous/anxious.    Past Medical History:  Diagnosis Date  . Acid reflux   . Allergic rhinitis   . Allergy   . Anxiety   . Heart murmur   . History of hiatal hernia   . Kidney stones   .  Migraine   . Ovarian cyst, left    Past Surgical History:  Procedure Laterality Date  . ABDOMINAL HYSTERECTOMY     2008; left ovary still intact  . BREAST BIOPSY Right 12/19/2013   stereo right- neg  . CHOLECYSTECTOMY    . COLONOSCOPY WITH PROPOFOL N/A 05/05/2016   Procedure: COLONOSCOPY WITH PROPOFOL;  Surgeon: Lollie Sails, MD;  Location: [facility] ENDOSCOPY;  Service: Endoscopy;  Laterality: N/A;  . ESOPHAGOGASTRODUODENOSCOPY (EGD) WITH PROPOFOL N/A 05/05/2016   Procedure: ESOPHAGOGASTRODUODENOSCOPY (EGD) WITH PROPOFOL;  Surgeon: Lollie Sails, MD;  Location: [facility] ENDOSCOPY;  Service: Endoscopy;  Laterality: N/A;  . EXTRACORPOREAL SHOCK WAVE LITHOTRIPSY Left 11/06/2016   Procedure: EXTRACORPOREAL SHOCK WAVE LITHOTRIPSY (ESWL);  Surgeon: Nickie Retort, MD;  Location: [facility] ORS;  Service: Urology;  Laterality: Left;  . gallbladder  2012  . GASTRIC BYPASS  2012  . REDUCTION MAMMAPLASTY Bilateral    2010   Family History  Problem Relation Age of Onset  . Prostate cancer Father   . Hyperlipidemia Father   . Hypertension Father   . Depression Father   . Kidney Stones Maternal Grandfather   . Stroke Maternal Grandfather   . Cancer Maternal Grandfather        prostate cancer   . Diabetes Mother   . Hyperlipidemia Mother   . Hypertension Mother   . Osteoporosis Mother        ? if has this   .  Multiple sclerosis Sister   . Heart disease Maternal Grandmother   . Cancer Other        breast cancer   . Breast cancer Other   . Osteoporosis Maternal Aunt   . Kidney disease Neg Hx   . Bladder Cancer Neg Hx    Social History   Socioeconomic History  . Marital status: Legally Separated    Spouse name: Not on file  . Number of children: Not on file  . Years of education: Not on file  . Highest education level: Not on file  Occupational History  . Not on file  Tobacco Use  . Smoking status: Never Smoker  . Smokeless tobacco: Never Used  Substance and Sexual Activity  .  Alcohol use: No    Alcohol/week: 0.0 standard drinks  . Drug use: No  . Sexual activity: Not on file  Other Topics Concern  . 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 Difficulty of Paying Living Expenses: Not on file  Food Insecurity:   . Worried About Charity fundraiser in the Last Year: Not on file  . Ran Out of Food in the Last Year: Not on file  Transportation Needs:   . Lack of Transportation (Medical): Not on file  . Lack of Transportation (Non-Medical): Not on file  Physical Activity:   . Days of Exercise per Week: Not on file  . Minutes of Exercise per Session: Not on file  Stress:   . Feeling of Stress : Not on file  Social Connections:   . Frequency of Communication with Friends and Family: Not on file  . Frequency of Social Gatherings with Friends and Family: Not on file  . Attends Religious Services: Not on file  . Active Member of Clubs or Organizations: Not on file  . Attends Archivist Meetings: Not on file  . Marital Status: Not on file  Intimate Partner Violence:   . Fear of Current or Ex-Partner: Not on file  . Emotionally Abused: Not on file  . Physically Abused: Not on file  . Sexually Abused: Not on file   Current Meds  Medication Sig  . Cyanocobalamin (RA VITAMIN B-12 TR) 1000 MCG TBCR Take by mouth.  . ferrous sulfate 325 (65 FE) MG EC tablet Take 325 mg by mouth 3 (three) times daily with meals.  . fluticasone (FLONASE) 50 MCG/ACT nasal spray Place 1 spray into both nostrils daily.  . Magnesium 400 MG CAPS Take by mouth.  . Multiple Vitamin (MULTIVITAMIN) tablet Take 1 tablet by mouth daily.   No Known Allergies Recent Results (from the past 2160 hour(s))  Urinalysis,  Complete w Microscopic     Status: Abnormal   Collection Time: 05/23/20 11:23 AM  Result Value Ref Range   Color, Urine YELLOW (A) YELLOW   APPearance HAZY (A) CLEAR   Specific Gravity, Urine 1.025 1.005 - 1.030   pH 5.0 5.0 - 8.0   Glucose, UA >=500 (A) NEGATIVE mg/dL   Hgb urine dipstick NEGATIVE NEGATIVE   Bilirubin Urine NEGATIVE NEGATIVE   Ketones, ur NEGATIVE NEGATIVE mg/dL  Protein, ur NEGATIVE NEGATIVE mg/dL   Nitrite NEGATIVE NEGATIVE   Leukocytes,Ua NEGATIVE NEGATIVE   RBC / HPF 0-5 0 - 5 RBC/hpf   WBC, UA 0-5 0 - 5 WBC/hpf   Bacteria, UA RARE (A) NONE SEEN   Squamous Epithelial / LPF 6-10 0 - 5   Mucus PRESENT     Comment: Performed at [facility], [address] [place], [place] [address]  Glucose, capillary     Status: None   Collection Time: 05/23/20 12:35 PM  Result Value Ref Range   Glucose-Capillary 70 70 - 99 mg/dL    Comment: Glucose reference range applies only to samples taken after fasting for at least 8 hours.   Comment 1 Notify RN    Comment 2 Document in Chart   Pregnancy, urine     Status: None   Collection Time: 05/23/20  1:01 PM  Result Value Ref Range   Preg Test, Ur NEGATIVE NEGATIVE    Comment: Performed at [facility], [address] [place], [place] [address]   Objective  Body mass index is 29.79 kg/m. Wt Readings from Last 3 Encounters:  05/23/20 178 lb (80.7 kg)  05/16/20 179 lb (81.2 kg)  11/08/19 169 lb (76.7 kg)   Temp Readings from Last 3 Encounters:  05/23/20 98.4 F (36.9 C) (Oral)  05/16/20 98 F (36.7 C) (Oral)  11/08/19 97.6 F (36.4 C) (Temporal)   BP Readings from Last 3 Encounters:  05/23/20 120/80  05/16/20 116/70  11/08/19 114/76   Pulse Readings from Last 3 Encounters:  05/23/20 78  05/16/20 81  11/08/19 73    Physical Exam Vitals and nursing note reviewed.  Constitutional:      Appearance: Normal appearance. She is well-developed, well-groomed and overweight.  HENT:     Head:  Normocephalic and atraumatic.  Eyes:     Conjunctiva/sclera: Conjunctivae normal.     Pupils: Pupils are equal, round, and reactive to light.  Cardiovascular:     Rate and Rhythm: Normal rate and regular rhythm.     Heart sounds: Normal heart sounds. No murmur heard.   Pulmonary:     Effort: Pulmonary effort is normal.     Breath sounds: Normal breath sounds.  Abdominal:     Tenderness: There is no abdominal tenderness.  Musculoskeletal:     Right lower leg: No edema.     Left lower leg: No edema.  Skin:    General: Skin is warm and dry.  Neurological:     General: No focal deficit present.     Mental Status: She is alert and oriented to person, place, and time. Mental status is at baseline.     Gait: Gait normal.  Psychiatric:        Attention and Perception: Attention and perception normal.        Mood and Affect: Mood and affect normal.        Speech: Speech normal.        Behavior: Behavior normal. Behavior is cooperative.        Thought Content: Thought content normal.        Cognition and Memory: Cognition and memory normal.        Judgment: Judgment normal.     Assessment  Plan  Annual physical exam Needs flu shot - Plan: Flu Vaccine QUAD 6+ mos PF IM (Fluarix Quad PF) Flu shot given utd today covid vx moderna 1st 11/01/19 and 2nd 12/02/19, consider booster  Tdap had 6/8/17and  01/16/18 Hep B immune.[facility] -[facility] titer in future mmr Consider check HIV in future  S/p hysterectomyonly left +hemorrhagic cyst with right ovary f/u OB/GYNKC, appt pending   mammo10/09/2018 negative sch 06/15/20  Colonoscopy had 04/2016 [facility] repeat rec in 5 years [facility]  Consider DEXA FH osteoporosischeck with insurance to see if covered  [facility] f/u chronic knee and hip pain  Continue healthy diet and exercise   06/29/20 [facility] consult hypoglycemia s/p gastric bypass with dizziness and abnormal tfts would they consider thyroid US   Results for Valerie, Colon (MRN [id number]) as of 06/15/2020 07:35  Ref. Range 10/28/2018 13:48 12/03/2018 08:49 05/06/2019 15:50 03/07/2020 08:17  TSH Latest Ref Range: 0.35 - 4.50 uIU/mL <0.01 (L) 0.66  0.83  T4,Free(Direct) Latest Ref Range: 0.60 - 1.60 ng/dL    0.76  Thyroxine (T4) Latest Ref Range: 5.1 - 11.9 mcg/dL 1.9 (L) 5.7    Free Thyroxine Index Latest Ref Range: 1.4 - 3.8  0.6 (L) 2.1    T3 Uptake Latest Ref Range: 22 - 35 % 32 37 (H)      Abdominal swelling no clear etiology- Plan: furosemide (LASIX) 20 MG tablet qd prn TVUS/pelvis 05/23/20 ED [facility] left hemorrhagic cyst s/p hysterectomy  Leg edema - Plan: furosemide (LASIX) 20 MG tablet qd prn   Left ovarian cyst, hemorrhagic s/p hysterectomy- Plan: f/u [facility] ob/gyn Dr. Chauncey Cruel  ANA positive  2021 w/u Dr. Amil Amen ENA panel negative no autoimmune etiology   Provider: Dr. Olivia Mackie McLean-Scocuzza-Internal Medicine

## 2020-06-19 DIAGNOSIS — R2231 Localized swelling, mass and lump, right upper limb: Secondary | ICD-10-CM | POA: Diagnosis not present

## 2020-06-29 DIAGNOSIS — E0590 Thyrotoxicosis, unspecified without thyrotoxic crisis or storm: Secondary | ICD-10-CM | POA: Diagnosis not present

## 2020-06-29 DIAGNOSIS — K912 Postsurgical malabsorption, not elsewhere classified: Secondary | ICD-10-CM | POA: Diagnosis not present

## 2020-07-27 DIAGNOSIS — R2232 Localized swelling, mass and lump, left upper limb: Secondary | ICD-10-CM | POA: Diagnosis not present

## 2020-07-27 DIAGNOSIS — D2112 Benign neoplasm of connective and other soft tissue of left upper limb, including shoulder: Secondary | ICD-10-CM | POA: Diagnosis not present

## 2020-07-27 DIAGNOSIS — D1739 Benign lipomatous neoplasm of skin and subcutaneous tissue of other sites: Secondary | ICD-10-CM | POA: Diagnosis not present

## 2020-08-15 ENCOUNTER — Ambulatory Visit: Payer: BC Managed Care – PPO | Admitting: Internal Medicine

## 2020-09-07 ENCOUNTER — Telehealth: Payer: BC Managed Care – PPO | Admitting: Internal Medicine

## 2020-09-12 DIAGNOSIS — Z20822 Contact with and (suspected) exposure to covid-19: Secondary | ICD-10-CM | POA: Diagnosis not present

## 2020-10-10 ENCOUNTER — Emergency Department: Payer: BC Managed Care – PPO

## 2020-10-10 ENCOUNTER — Emergency Department
Admission: EM | Admit: 2020-10-10 | Discharge: 2020-10-10 | Disposition: A | Discharge status: Home / Self Care | Payer: BC Managed Care – PPO | Attending: Emergency Medicine | Admitting: Emergency Medicine

## 2020-10-10 ENCOUNTER — Other Ambulatory Visit: Payer: Self-pay

## 2020-10-10 ENCOUNTER — Encounter: Payer: Self-pay | Admitting: Emergency Medicine

## 2020-10-10 DIAGNOSIS — R102 Pelvic and perineal pain: Secondary | ICD-10-CM | POA: Insufficient documentation

## 2020-10-10 DIAGNOSIS — Z90722 Acquired absence of ovaries, bilateral: Secondary | ICD-10-CM | POA: Diagnosis not present

## 2020-10-10 DIAGNOSIS — R109 Unspecified abdominal pain: Secondary | ICD-10-CM | POA: Diagnosis not present

## 2020-10-10 DIAGNOSIS — Z87442 Personal history of urinary calculi: Secondary | ICD-10-CM | POA: Insufficient documentation

## 2020-10-10 DIAGNOSIS — Z9884 Bariatric surgery status: Secondary | ICD-10-CM | POA: Diagnosis not present

## 2020-10-10 DIAGNOSIS — Z90710 Acquired absence of both cervix and uterus: Secondary | ICD-10-CM | POA: Diagnosis not present

## 2020-10-10 DIAGNOSIS — R1012 Left upper quadrant pain: Secondary | ICD-10-CM | POA: Diagnosis not present

## 2020-10-10 LAB — URINALYSIS, COMPLETE (UACMP) WITH MICROSCOPIC
Bacteria, UA: NONE SEEN
Bilirubin Urine: NEGATIVE
Glucose, UA: 50 mg/dL — AB
Hgb urine dipstick: NEGATIVE
Ketones, ur: NEGATIVE mg/dL
Nitrite: NEGATIVE
Protein, ur: NEGATIVE mg/dL
Specific Gravity, Urine: 1.021 (ref 1.005–1.030)
pH: 5 (ref 5.0–8.0)

## 2020-10-10 LAB — BASIC METABOLIC PANEL
Anion gap: 7 (ref 5–15)
BUN: 23 mg/dL — ABNORMAL HIGH (ref 6–20)
CO2: 24 mmol/L (ref 22–32)
Calcium: 9 mg/dL (ref 8.9–10.3)
Chloride: 107 mmol/L (ref 98–111)
Creatinine, Ser: 1.31 mg/dL — ABNORMAL HIGH (ref 0.44–1.00)
GFR, Estimated: 50 mL/min — ABNORMAL LOW (ref >60)
Glucose, Bld: 177 mg/dL — ABNORMAL HIGH (ref 70–99)
Potassium: 3.8 mmol/L (ref 3.5–5.1)
Sodium: 138 mmol/L (ref 135–145)

## 2020-10-10 LAB — CBC
HCT: 40.2 % (ref 36.0–46.0)
Hemoglobin: 13.1 g/dL (ref 12.0–15.0)
MCH: 31.8 pg (ref 26.0–34.0)
MCHC: 32.6 g/dL (ref 30.0–36.0)
MCV: 97.6 fL (ref 80.0–100.0)
Platelets: 263 10*3/uL (ref 150–400)
RBC: 4.12 MIL/uL (ref 3.87–5.11)
RDW: 13.3 % (ref 11.5–15.5)
WBC: 7.7 10*3/uL (ref 4.0–10.5)
nRBC: 0 % (ref 0.0–0.2)

## 2020-10-10 NOTE — ED Provider Notes (Signed)
[facility] Emergency Department Provider Note   ____________________________________________   Event Date/Time   First MD Initiated Contact with Patient 10/10/20 1249     (approximate)  I have reviewed the triage vital signs and the nursing notes.   HISTORY  Chief Complaint Back Pain and Flank Pain    HPI Valerie Colon is a 50 y.o. female patient complain of bilateral back pain and left flank pain that started around 5:00 this morning.  No provocative incident for complaint.  Patient was concerned secondary to history ovarian cyst and kidney stones.  Patient denies vaginal bleeding.  Patient denies urinary symptoms.  Patient rates pain initially 7/10.  Patient said pain has decreased to a 4/10 since arrival.         Past Medical History:  Diagnosis Date  . Acid reflux   . Allergic rhinitis   . Allergy   . Anxiety   . Heart murmur   . History of hiatal hernia   . Kidney stones   . Migraine   . Ovarian cyst, left     Patient Active Problem List   Diagnosis Date Noted  . ANA positive 05/16/2020  . Hiatal hernia 05/16/2020  . Benign cyst of right kidney 05/16/2020  . Annual physical exam 05/06/2019  . Family history of osteoporosis 05/07/2018  . Cyst of left ovary 05/07/2018  . Cardiac murmur 02/12/2018  . Postprandial hypoglycemia 02/12/2018  . 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 Epigastric abdominal pain 11/13/2017  . Hypotension 11/13/2017  . Chronic gastritis 10/09/2017  . Migraine headache without aura 10/09/2017  . Soft tissue lesion of elbow region 10/09/2017  . Chronic pain of both knees 10/09/2017  . Shoulder pain, right 10/09/2017  . Pelvic pain 10/09/2017  . Breast calcifications 10/09/2017  . Increased frequency of headaches 10/02/2017  . Snoring 07/31/2017  . Sprain of ligament of tarsometatarsal joint 04/01/2016  . Nephrolithiasis 02/29/2016  . Chronic fatigue 01/24/2016  .  Dizziness 01/24/2016  . Gastroesophageal reflux disease without esophagitis 01/24/2016  . GAD (generalized anxiety disorder) 11/20/2015  . Emotional stress 11/09/2015  . Intermittent palpitations 11/09/2015  . Vitamin D deficiency 07/09/2015  . Allergic rhinitis 03/29/2015  . Chronic anemia 01/08/2015  . Prediabetes 01/08/2015  . Obstructive apnea 01/20/2014  . Hypoglycemia after GI (gastrointestinal) surgery 11/26/2012    Past Surgical History:  Procedure Laterality Date  . ABDOMINAL HYSTERECTOMY     2008; left ovary still intact  . BREAST BIOPSY Right 12/19/2013   stereo right- neg  . CHOLECYSTECTOMY    . COLONOSCOPY WITH PROPOFOL N/A 05/05/2016   Procedure: COLONOSCOPY WITH PROPOFOL;  Surgeon: Lollie Sails, MD;  Location[facility] ENDOSCOPY;  Service: Endoscopy;  Laterality: N/A;  . ESOPHAGOGASTRODUODENOSCOPY (EGD) WITH PROPOFOL N/A 05/05/2016   Procedure: ESOPHAGOGASTRODUODENOSCOPY (EGD) WITH PROPOFOL;  Surgeon: Lollie Sails, MD;  Location: [facility] ENDOSCOPY;  Service: Endoscopy;  Laterality: N/A;  . EXTRACORPOREAL SHOCK WAVE LITHOTRIPSY Left 11/06/2016   Procedure: EXTRACORPOREAL SHOCK WAVE LITHOTRIPSY (ESWL);  Surgeon: Nickie Retort, MD;  Location: [facility] ORS;  Service: Urology;  Laterality: Left;  . gallbladder  2012  . GASTRIC BYPASS  2012  . REDUCTION MAMMAPLASTY Bilateral    2010    Prior to Admission medications   Medication Sig Start Date End Date Taking? Authorizing Provider  Cyanocobalamin (RA VITAMIN B-12 TR) 1000 MCG TBCR Take by mouth.    [provider]  ferrous sulfate 325 (65 FE) MG EC  tablet Take 325 mg by mouth 3 (three) times daily with meals.    [provider]  fluticasone (FLONASE) 50 MCG/ACT nasal spray Place 1 spray into both nostrils daily.    [provider]  furosemide (LASIX) 20 MG tablet Take 1 tablet (20 mg total) by mouth daily as needed. 05/16/20   McLean-Scocuzza, Nino Glow, MD  HYDROcodone-acetaminophen  (NORCO/VICODIN) 5-325 MG tablet Take 1 tablet by mouth every 6 (six) hours as needed for moderate pain. 05/23/20   Caryn Section Linden Dolin, PA-C  Magnesium 400 MG CAPS Take by mouth.    [provider]  Multiple Vitamin (MULTIVITAMIN) tablet Take 1 tablet by mouth daily.    [provider]    Allergies Patient has no known allergies.  Family History  Problem Relation Age of Onset  . Prostate cancer Father   . Hyperlipidemia Father   . Hypertension Father   . Depression Father   . Kidney Stones Maternal Grandfather   . Stroke Maternal Grandfather   . Cancer Maternal Grandfather        prostate cancer   . Diabetes Mother   . Hyperlipidemia Mother   . Hypertension Mother   . Osteoporosis Mother        ? if has this   . Multiple sclerosis Sister   . Heart disease Maternal Grandmother   . Cancer Other        breast cancer   . Breast cancer Other   . Osteoporosis Maternal Aunt   . Kidney disease Neg Hx   . Bladder Cancer Neg Hx     Social History Social History   Tobacco Use  . Smoking status: Never Smoker  . Smokeless tobacco: Never Used  Substance Use Topics  . Alcohol use: No    Alcohol/week: 0.0 standard drinks  . Drug use: No    Review of Systems Constitutional: No fever/chills Eyes: No visual changes. ENT: No sore throat. Cardiovascular: Denies chest pain. Respiratory: Denies shortness of breath. Gastrointestinal: No abdominal pain.  No nausea, no vomiting.  No diarrhea.  No constipation. Genitourinary: Negative for dysuria. Musculoskeletal: Negative for back pain. Skin: Negative for rash. Neurological: Negative for headaches, focal weakness or numbness. Psychiatric: Anxiety. ____________________________________________   PHYSICAL EXAM:  VITAL SIGNS: ED Triage Vitals  Enc Vitals Group     BP 10/10/20 1008 117/61     Pulse Rate 10/10/20 1008 89     Resp 10/10/20 1008 20     Temp 10/10/20 1008 98.3 F (36.8 C)     Temp Source 10/10/20 1008  Oral     SpO2 10/10/20 1008 97 %     Weight 10/10/20 1007 185 lb (83.9 kg)     Height 10/10/20 1007 5\' 5"  (1.651 m)     Head Circumference --      Peak Flow --      Pain Score 10/10/20 1006 7     Pain Loc --      Pain Edu? --      Excl. in [place]? --     Constitutional: Alert and oriented. Well appearing and in no acute distress. Hematological/Lymphatic/Immunilogical: No cervical lymphadenopathy. Cardiovascular: Normal rate, regular rhythm. Grossly normal heart sounds.  Good peripheral circulation. Respiratory: Normal respiratory effort.  No retractions. Lungs CTAB. Gastrointestinal: Soft and nontender. No distention. No abdominal bruits. No CVA tenderness. Genitourinary: Deferred Musculoskeletal: No lower extremity tenderness nor edema.  No joint effusions. Neurologic:  Normal speech and language. No gross focal neurologic deficits are appreciated.  No gait instability. Skin:  Skin is warm, dry and intact. No rash noted. Psychiatric: Mood and affect are normal. Speech and behavior are normal.  ____________________________________________   LABS (all labs ordered are listed, but only abnormal results are displayed)  Labs Reviewed  URINALYSIS, COMPLETE (UACMP) WITH MICROSCOPIC - Abnormal; Notable for the following components:      Result Value   Color, Urine YELLOW (*)    APPearance HAZY (*)    Glucose, UA 50 (*)    Leukocytes,Ua MODERATE (*)    All other components within normal limits  BASIC METABOLIC PANEL - Abnormal; Notable for the following components:   Glucose, Bld 177 (*)    BUN 23 (*)    Creatinine, Ser 1.31 (*)    GFR, Estimated 50 (*)    All other components within normal limits  CBC   ____________________________________________  EKG   ____________________________________________  RADIOLOGY I, Sable Feil, personally viewed and evaluated these images (plain radiographs) as part of my medical decision making, as well as reviewing the written report by the  radiologist.  ED MD interpretation:    Official radiology report(s): US PELVIC COMPLETE WITH TRANSVAGINAL  Result Date: 10/10/2020 CLINICAL DATA:  Pelvic pain the EXAM: TRANSABDOMINAL AND TRANSVAGINAL ULTRASOUND OF PELVIS TECHNIQUE: Both transabdominal and transvaginal ultrasound examinations of the pelvis were performed. Transabdominal technique was performed for global imaging of the pelvis including uterus, ovaries, adnexal regions, and pelvic cul-de-sac. It was necessary to proceed with endovaginal exam following the transabdominal exam to visualize the LEFT ovary. COMPARISON:  05/23/2020 FINDINGS: Uterus Surgically absent Endometrium Thickness: N/A.  No focal abnormality visualized. Right ovary Surgically absent Left ovary Measurements: 4.2 x 1.8 x 2.1 cm = volume: 7.9 mL. Normal morphology without mass Other findings No free pelvic fluid.  No adnexal masses. IMPRESSION: Post hysterectomy and RIGHT oophorectomy. Normal LEFT ovary. No pelvic sonographic abnormalities identified. Electronically Signed   By: Lavonia Dana M.D.   On: 10/10/2020 14:29    ____________________________________________   PROCEDURES  Procedure(s) performed (including Critical Care):  Procedures   ____________________________________________   INITIAL IMPRESSION / ASSESSMENT AND PLAN / ED COURSE  As part of my medical decision making, I reviewed the following data within the [place]         Patient presents with left flank and back pain which started early this morning.  Patient is concerned because she had a history of kidney stones and ovarian cyst.  Discussed no acute findings on labs and pelvic ultrasound.  Patient state her pain is almost completely resolved no intervention.  Patient given discharge care instruction advised him back to ED if condition worsens.      ____________________________________________   FINAL CLINICAL IMPRESSION(S) / ED DIAGNOSES  Final diagnoses:   Pelvic pain  Flank pain     ED Discharge Orders    None      *Please note:  MORNINGSTAR TOFT was evaluated in Emergency Department on 10/10/2020 for the symptoms described in the history of present illness. She was evaluated in the context of the global COVID-19 pandemic, which necessitated consideration that the patient might be at risk for infection with the SARS-CoV-2 virus that causes COVID-19. Institutional protocols and algorithms that pertain to the evaluation of patients at risk for COVID-19 are in a state of rapid change based on information released by regulatory bodies including the CDC and federal and state organizations. These policies and algorithms were followed during the patient's care in the  ED.  Some ED evaluations and interventions may be delayed as a result of limited staffing during and the pandemic.*   Note:  This document was prepared using Dragon voice recognition software and may include unintentional dictation errors.    Sable Feil, PA-C 10/10/20 1537    Blake Divine, MD 10/17/20 [phone]

## 2020-10-10 NOTE — Discharge Instructions (Addendum)
No acute findings on on your labs and ultrasound.  Follow discharge care instruction and advised over-the-counter anti-inflammatories as needed.  Return right ED if condition worsens.

## 2020-10-10 NOTE — ED Triage Notes (Signed)
Pt to ED via POV with c/o bilateral lower back pain and L flank pain that started at approx 0500 this morning, pt states hx of ovarian cyst and kidney stones, pt states pain radiates from lower back around to front and from L flank down L leg. Pt denies vaginal bleeding, denies urinary symptoms at this time.

## 2020-10-10 NOTE — ED Notes (Signed)
PT HAS HAD HYSTERECTOMY. CALLING U/S TO MAKE AWARE

## 2020-11-09 ENCOUNTER — Encounter: Payer: Self-pay | Admitting: Internal Medicine

## 2020-11-09 ENCOUNTER — Ambulatory Visit (INDEPENDENT_AMBULATORY_CARE_PROVIDER_SITE_OTHER): Payer: BC Managed Care – PPO | Admitting: Internal Medicine

## 2020-11-09 ENCOUNTER — Other Ambulatory Visit: Payer: Self-pay

## 2020-11-09 VITALS — BP 108/72 | HR 75 | Temp 97.9°F | Ht 65.0 in | Wt 193.0 lb

## 2020-11-09 DIAGNOSIS — R946 Abnormal results of thyroid function studies: Secondary | ICD-10-CM

## 2020-11-09 DIAGNOSIS — Z9884 Bariatric surgery status: Secondary | ICD-10-CM

## 2020-11-09 DIAGNOSIS — Z1231 Encounter for screening mammogram for malignant neoplasm of breast: Secondary | ICD-10-CM

## 2020-11-09 DIAGNOSIS — E2839 Other primary ovarian failure: Secondary | ICD-10-CM

## 2020-11-09 DIAGNOSIS — R232 Flushing: Secondary | ICD-10-CM | POA: Diagnosis not present

## 2020-11-09 DIAGNOSIS — Z780 Asymptomatic menopausal state: Secondary | ICD-10-CM | POA: Diagnosis not present

## 2020-11-09 DIAGNOSIS — G43909 Migraine, unspecified, not intractable, without status migrainosus: Secondary | ICD-10-CM

## 2020-11-09 DIAGNOSIS — R81 Glycosuria: Secondary | ICD-10-CM

## 2020-11-09 DIAGNOSIS — R739 Hyperglycemia, unspecified: Secondary | ICD-10-CM

## 2020-11-09 DIAGNOSIS — R7989 Other specified abnormal findings of blood chemistry: Secondary | ICD-10-CM

## 2020-11-09 DIAGNOSIS — R1013 Epigastric pain: Secondary | ICD-10-CM

## 2020-11-09 DIAGNOSIS — Z1211 Encounter for screening for malignant neoplasm of colon: Secondary | ICD-10-CM

## 2020-11-09 MED ORDER — SUMATRIPTAN SUCCINATE 100 MG PO TABS: 100.0000 mg | ORAL_TABLET | ORAL | 11 refills | Status: DC | PRN

## 2020-11-09 NOTE — Patient Instructions (Addendum)
estrovan or amberen NOT black cohosh   Mammogram due 06/15/21 with bone density [place]   Egd/colonoscopy 04/2021 [facility]  Pap smear Xmas 07/2023  Menopause Menopause is the normal time of a woman's life when menstrual periods stop completely. It marks the natural end to a woman's ability to become pregnant. It can be defined as the absence of a menstrual period for 12 months without another medical cause. The transition to menopause (perimenopause) most often happens between the ages of 64 and 58, and can last for many years. During perimenopause, hormone levels change in your body, which can cause symptoms and affect your health. Menopause may increase your risk for:  Weakened bones (osteoporosis), which causes fractures.  Depression.  Hardening and narrowing of the arteries (atherosclerosis), which can cause heart attacks and strokes. What are the causes? This condition is usually caused by a natural change in hormone levels that happens as you get older. The condition may also be caused by changes that are not natural, including:  Surgery to remove both ovaries (surgical menopause).  Side effects from some medicines, such as chemotherapy used to treat cancer (chemical menopause). What increases the risk? This condition is more likely to start at an earlier age if you have certain medical conditions or have undergone treatments, including:  A tumor of the pituitary gland in the brain.  A disease that affects the ovaries and hormones.  Certain cancer treatments, such as chemotherapy or hormone therapy, or radiation therapy on the pelvis.  Heavy smoking and excessive alcohol use.  Family history of early menopause. This condition is also more likely to develop earlier in women who are very thin. What are the signs or symptoms? Symptoms of this condition include:  Hot flashes.  Irregular menstrual periods.  Night sweats.  Changes in feelings about sex. This could be a  decrease in sex drive or an increased discomfort around your sexuality.  Vaginal dryness and thinning of the vaginal walls. This may cause painful sex.  Dryness of the skin and development of wrinkles.  Headaches.  Problems sleeping (insomnia).  Mood swings or irritability.  Memory problems.  Weight gain.  Hair growth on the face and chest.  Bladder infections or problems with urinating. How is this diagnosed? This condition is diagnosed based on your medical history, a physical exam, your age, your menstrual history, and your symptoms. Hormone tests may also be done. How is this treated? In some cases, no treatment is needed. You and your health care provider should make a decision together about whether treatment is necessary. Treatment will be based on your individual condition and preferences. Treatment for this condition focuses on managing symptoms. Treatment may include:  Menopausal hormone therapy (MHT).  Medicines to treat specific symptoms or complications.  Acupuncture.  Vitamin or herbal supplements. Before starting treatment, make sure to let your health care provider know if you have a personal or family history of these conditions:  Heart disease.  Breast cancer.  Blood clots.  Diabetes.  Osteoporosis. Follow these instructions at home: Lifestyle  Do not use any products that contain nicotine or tobacco, such as cigarettes, e-cigarettes, and chewing tobacco. If you need help quitting, ask your health care provider.  Get at least 30 minutes of physical activity on 5 or more days each week.  Avoid alcoholic and caffeinated beverages, as well as spicy foods. This may help prevent hot flashes.  Get 7-8 hours of sleep each night.  If you have hot flashes, try: ?  Dressing in layers. ? Avoiding things that may trigger hot flashes, such as spicy food, warm places, or stress. ? Taking slow, deep breaths when a hot flash starts. ? Keeping a fan in your  home and office.  Find ways to manage stress, such as deep breathing, meditation, or journaling.  Consider going to group therapy with other women who are having menopause symptoms. Ask your health care provider about recommended group therapy meetings. Eating and drinking  Eat a healthy, balanced diet that contains whole grains, lean protein, low-fat dairy, and plenty of fruits and vegetables.  Your health care provider may recommend adding more soy to your diet. Foods that contain soy include tofu, tempeh, and soy milk.  Eat plenty of foods that contain calcium and vitamin D for bone health. Items that are rich in calcium include low-fat milk, yogurt, beans, almonds, sardines, broccoli, and kale.   Medicines  Take over-the-counter and prescription medicines only as told by your health care provider.  Talk with your health care provider before starting any herbal supplements. If prescribed, take vitamins and supplements as told by your health care provider. General instructions  Keep track of your menstrual periods, including: ? When they occur. ? How heavy they are and how long they last. ? How much time passes between periods.  Keep track of your symptoms, noting when they start, how often you have them, and how long they last.  Use vaginal lubricants or moisturizers to help with vaginal dryness and improve comfort during sex.  Keep all follow-up visits. This is important. This includes any group therapy or counseling.   Contact a health care provider if:  You are still having menstrual periods after age 65.  You have pain during sex.  You have not had a period for 12 months and you develop vaginal bleeding. Get help right away if you have:  Severe depression.  Excessive vaginal bleeding.  Pain when you urinate.  A fast or irregular heartbeat (palpitations).  Severe headaches.  Abdominal pain or severe indigestion. Summary  Menopause is a normal time of life when  menstrual periods stop completely. It is usually defined as the absence of a menstrual period for 12 months without another medical cause.  The transition to menopause (perimenopause) most often happens between the ages of 27 and 80 and can last for several years.  Symptoms can be managed through medicines, lifestyle changes, and complementary therapies such as acupuncture.  Eat a balanced diet that is rich in nutrients to promote bone health and heart health and to manage symptoms during menopause. This information is not intended to replace advice given to you by your health care provider. Make sure you discuss any questions you have with your health care provider. Document Revised: 05/04/2020 Document Reviewed: 01/19/2020 Elsevier Patient Education  [place].

## 2020-11-09 NOTE — Progress Notes (Signed)
Chief Complaint  Patient presents with  . Annual Exam   F/u  1. H/o glucose on urine and prediabetes FH DM mom and sister s/p gastric bypass  2. Elevated creatinine 1.3 ED visit 10/10/20 with GFR 50 will repeat today she went to ed for flank pain which has since resolved she thought she had kidney stone as has had previously but no imaging done  3. She is c/w sx's menopause and weight gain though exercising 7 days a week and watching what she eats h/o abnormal thyroid labs though normal TSH and [facility] endocrine 06/2020 she wants these repeated today  4. She has had h/a with h/o migraines and was taking imitrex 100 mg prn but has been taking BC powder, she reports stress is low    Review of Systems  Constitutional: Positive for weight loss.  HENT: Negative for hearing loss.   Eyes: Negative for blurred vision.  Respiratory: Negative for shortness of breath.   Cardiovascular: Negative for chest pain.  Gastrointestinal: Negative for abdominal pain.  Musculoskeletal: Negative for falls and joint pain.  Skin: Negative for rash.  Neurological: Positive for headaches.  Psychiatric/Behavioral: Negative for depression.       Less stress   Past Medical History:  Diagnosis Date  . Acid reflux   . Allergic rhinitis   . Allergy   . Anxiety   . Heart murmur   . History of hiatal hernia   . Kidney stones   . Mass of finger    myxoid neurofibroma multiple  . Migraine   . Ovarian cyst, left    Past Surgical History:  Procedure Laterality Date  . ABDOMINAL HYSTERECTOMY     2008; left ovary still intact  . BREAST BIOPSY Right 12/19/2013   stereo right- neg  . CHOLECYSTECTOMY    . COLONOSCOPY WITH PROPOFOL N/A 05/05/2016   Procedure: COLONOSCOPY WITH PROPOFOL;  Surgeon: Lollie Sails, MD;  Location: [facility] ENDOSCOPY;  Service: Endoscopy;  Laterality: N/A;  . ESOPHAGOGASTRODUODENOSCOPY (EGD) WITH PROPOFOL N/A 05/05/2016   Procedure: ESOPHAGOGASTRODUODENOSCOPY (EGD) WITH PROPOFOL;  Surgeon:  Lollie Sails, MD;  Location: [facility] ENDOSCOPY;  Service: Endoscopy;  Laterality: N/A;  . EXTRACORPOREAL SHOCK WAVE LITHOTRIPSY Left 11/06/2016   Procedure: EXTRACORPOREAL SHOCK WAVE LITHOTRIPSY (ESWL);  Surgeon: Nickie Retort, MD;  Location: [facility] ORS;  Service: Urology;  Laterality: Left;  . gallbladder  2012  . GASTRIC BYPASS  2012  . REDUCTION MAMMAPLASTY Bilateral    2010   Family History  Problem Relation Age of Onset  . Prostate cancer Father   . Hyperlipidemia Father   . Hypertension Father   . Depression Father   . Kidney Stones Maternal Grandfather   . Stroke Maternal Grandfather   . Cancer Maternal Grandfather        prostate cancer   . Diabetes Mother   . Hyperlipidemia Mother   . Hypertension Mother   . Osteoporosis Mother        ? if has this   . Multiple sclerosis Sister   . Diabetes Sister   . Heart disease Maternal Grandmother   . Cancer Other        breast cancer   . Breast cancer Other   . Osteoporosis Maternal Aunt   . Kidney disease Neg Hx   . Bladder Cancer Neg Hx    Social History   Socioeconomic History  . Marital status: Legally Separated    Spouse name: Not on file  . Number of children: Not  on file  . Years of education: Not on file  . Highest education level: Not on file  Occupational History  . Not on file  Tobacco Use  . Smoking status: Never Smoker  . Smokeless tobacco: Never Used  Substance and Sexual Activity  . Alcohol use: No    Alcohol/week: 0.0 standard drinks  . Drug use: No  . Sexual activity: Not on file  Other Topics Concern  . 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 CALCIUM PO Take by mouth.  . Cholecalciferol (VITAMIN D3 PO) Take by mouth.  . Cyanocobalamin 1000 MCG TBCR Take by mouth.  . ferrous sulfate 325 (65 FE) MG EC tablet Take 325 mg by mouth 3 (three) times daily with meals.  . Magnesium 400 MG CAPS Take by mouth.  . Multiple Vitamin (MULTIVITAMIN) tablet Take 1 tablet by mouth daily.  . SUMAtriptan (IMITREX) 100 MG tablet Take 1 tablet (100 mg total) by mouth every 2 (two) hours as needed for migraine. May repeat in 2 hours if headache persists or recurs. Max dose 200 mg/24 hours   No Known Allergies Recent Results (from the past 2160 hour(s))  Urinalysis, Complete w Microscopic     Status: Abnormal   Collection Time: 10/10/20 10:09 AM  Result Value Ref Range   Color, Urine YELLOW (A) YELLOW   APPearance HAZY (A) CLEAR   Specific Gravity, Urine 1.021 1.005 - 1.030   pH 5.0 5.0 - 8.0   Glucose, UA 50 (A) NEGATIVE mg/dL   Hgb urine dipstick NEGATIVE NEGATIVE   Bilirubin Urine NEGATIVE NEGATIVE   Ketones, ur NEGATIVE NEGATIVE mg/dL   Protein, ur NEGATIVE NEGATIVE mg/dL   Nitrite NEGATIVE NEGATIVE   Leukocytes,Ua MODERATE (A) NEGATIVE   RBC / HPF 0-5 0 - 5 RBC/hpf   WBC, UA 0-5 0 - 5 WBC/hpf   Bacteria, UA NONE SEEN NONE SEEN   Squamous Epithelial / LPF 6-10 0 - 5   Mucus PRESENT     Comment: Performed at [facility], [address], [place],  [address]  Basic metabolic panel     Status: Abnormal   Collection Time: 10/10/20 10:09 AM  Result Value Ref Range   Sodium 138 135 - 145 mmol/L   Potassium 3.8 3.5 - 5.1 mmol/L   Chloride 107 98 - 111 mmol/L   CO2 24 22 - 32 mmol/L   Glucose, Bld 177 (H) 70 - 99 mg/dL    Comment: Glucose reference range applies only to samples taken after fasting for at least 8 hours.   BUN 23 (H) 6 - 20 mg/dL   Creatinine, Ser 1.31 (H)  0.44 - 1.00 mg/dL   Calcium 9.0 8.9 - 10.3 mg/dL   GFR, Estimated 50 (L) >60 mL/min    Comment: (NOTE) Calculated using the CKD-EPI Creatinine Equation (2021)  Anion gap 7 5 - 15    Comment: Performed at [facility], [address] [place], [place] [address]  CBC     Status: None   Collection Time: 10/10/20 10:09 AM  Result Value Ref Range   WBC 7.7 4.0 - 10.5 K/uL   RBC 4.12 3.87 - 5.11 MIL/uL   Hemoglobin 13.1 12.0 - 15.0 g/dL   HCT 40.2 36.0 - 46.0 %   MCV 97.6 80.0 - 100.0 fL   MCH 31.8 26.0 - 34.0 pg   MCHC 32.6 30.0 - 36.0 g/dL   RDW 13.3 11.5 - 15.5 %   Platelets 263 150 - 400 K/uL   nRBC 0.0 0.0 - 0.2 %    Comment: Performed at [facility], [address] [place], [place] [address]   Objective  Body mass index is 32.12 kg/m. Wt Readings from Last 3 Encounters:  11/09/20 193 lb (87.5 kg)  10/10/20 185 lb (83.9 kg)  05/23/20 178 lb (80.7 kg)   Temp Readings from Last 3 Encounters:  11/09/20 97.9 F (36.6 C) (Oral)  10/10/20 98.3 F (36.8 C) (Oral)  05/23/20 98.4 F (36.9 C) (Oral)   BP Readings from Last 3 Encounters:  11/09/20 108/72  10/10/20 105/71  05/23/20 120/80   Pulse Readings from Last 3 Encounters:  11/09/20 75  10/10/20 61  05/23/20 78    Physical Exam Vitals and nursing note reviewed.  Constitutional:      Appearance: Normal appearance. She is well-developed and well-groomed. She is obese.  HENT:     Head: Normocephalic and atraumatic.  Eyes:     Conjunctiva/sclera: Conjunctivae normal.     Pupils: Pupils are equal, round, and reactive to light.  Cardiovascular:     Rate and Rhythm: Normal rate and regular rhythm.     Heart sounds: Normal heart sounds. No murmur heard.   Pulmonary:     Effort: Pulmonary effort is normal.     Breath sounds: Normal breath sounds.  Abdominal:     Tenderness: There is no abdominal tenderness.  Skin:    General: Skin is warm and dry.  Neurological:     General: No focal  deficit present.     Mental Status: She is alert and oriented to person, place, and time. Mental status is at baseline.     Gait: Gait normal.  Psychiatric:        Attention and Perception: Attention and perception normal.        Mood and Affect: Mood and affect normal.        Speech: Speech normal.        Behavior: Behavior normal. Behavior is cooperative.        Thought Content: Thought content normal.        Cognition and Memory: Cognition and memory normal.        Judgment: Judgment normal.     Assessment  Plan  Elevated serum creatinine - Plan: Urinalysis, Routine w reflex microscopic, Microalbumin / creatinine urine ratio, Sodium, urine, random, Comprehensive metabolic panel  Abnormal thyroid function test - Plan: T4, free, TSH, Thyroid peroxidase antibody, T3, free  Menopause - Plan: FSH Hot flashes s/p hysterectomy  Disc estrovan or amberen   Hyperglycemia - Plan: Hemoglobin A1c Glucose found in urine on examination - Plan: Urinalysis, Routine w reflex microscopic  Migraine without status migrainosus, not intractable, unspecified migraine type - Plan: SUMAtriptan (IMITREX) 100 MG tablet prn  Stop BC powder  HM--> CPE due 05/16/21  Flu shot givenutd covid vx moderna 3/3  Tdap had 6/8/17and 01/16/18 Hep B immune.rec mmr vaccinehad [facility] Consider check HIV/hep C/MMR in future  S/p hysterectomyonly left +hemorrhagic cyst with right ovary f/u OB/GYNKC pap had 08/11/2019 [facility] normal  mammo10/09/2018 negativesch 06/15/20 with DEXA  Colonoscopy had 04/2016 [facility] repeat rec in 5 years [facility]  Consider DEXA FH osteoporosischeck with insurance to see if covered  [facility] f/u chronic knee and hip pain  Continue healthy diet and exercise   06/29/20 [facility] consult hypoglycemia s/p gastric bypass with dizziness and abnormal tfts would they consider thyroid US  Results for ZIKERIA, KEOUGH (MRN [id number]) as of 06/15/2020 07:35  Ref.  Range 10/28/2018 13:48 12/03/2018 08:49 05/06/2019 15:50 03/07/2020 08:17  TSH Latest Ref Range: 0.35 - 4.50 uIU/mL <0.01 (L) 0.66  0.83  T4,Free(Direct) Latest Ref Range: 0.60 - 1.60 ng/dL    0.76  Thyroxine (T4) Latest Ref Range: 5.1 - 11.9 mcg/dL 1.9 (L) 5.7    Free Thyroxine Index Latest Ref Range: 1.4 - 3.8  0.6 (L) 2.1    T3 Uptake Latest Ref Range: 22 - 35 % 32 37 (H)      ANA positive  2021 w/u Dr. Amil Amen ENA panel negative no autoimmune etiology   Provider: Dr. Olivia Mackie McLean-Scocuzza-Internal Medicine

## 2020-11-10 LAB — THYROID PEROXIDASE ANTIBODY: Thyroperoxidase Ab SerPl-aCnc: 24 IU/mL (ref 0–34)

## 2020-11-10 LAB — COMPREHENSIVE METABOLIC PANEL
ALT: 34 IU/L — ABNORMAL HIGH (ref 0–32)
AST: 60 IU/L — ABNORMAL HIGH (ref 0–40)
Albumin/Globulin Ratio: 1.3 (ref 1.2–2.2)
Albumin: 4.2 g/dL (ref 3.8–4.8)
Alkaline Phosphatase: 86 IU/L (ref 44–121)
BUN/Creatinine Ratio: 24 — ABNORMAL HIGH (ref 9–23)
BUN: 27 mg/dL — ABNORMAL HIGH (ref 6–24)
Bilirubin Total: 0.2 mg/dL (ref 0.0–1.2)
CO2: 24 mmol/L (ref 20–29)
Calcium: 9.2 mg/dL (ref 8.7–10.2)
Chloride: 103 mmol/L (ref 96–106)
Creatinine, Ser: 1.11 mg/dL — ABNORMAL HIGH (ref 0.57–1.00)
Globulin, Total: 3.2 g/dL (ref 1.5–4.5)
Glucose: 75 mg/dL (ref 65–99)
Potassium: 4.6 mmol/L (ref 3.5–5.2)
Sodium: 140 mmol/L (ref 134–144)
Total Protein: 7.4 g/dL (ref 6.0–8.5)
eGFR: 61 mL/min/{1.73_m2} (ref >59)

## 2020-11-10 LAB — T4, FREE: Free T4: 0.67 ng/dL — ABNORMAL LOW (ref 0.82–1.77)

## 2020-11-10 LAB — MICROALBUMIN / CREATININE URINE RATIO
Creatinine, Urine: 92.6 mg/dL
Microalb/Creat Ratio: <3 mg/g creat (ref 0–29)
Microalbumin, Urine: <3 ug/mL

## 2020-11-10 LAB — URINALYSIS, ROUTINE W REFLEX MICROSCOPIC
Bilirubin, UA: NEGATIVE
Glucose, UA: NEGATIVE
Ketones, UA: NEGATIVE
Leukocytes,UA: NEGATIVE
Nitrite, UA: NEGATIVE
Protein,UA: NEGATIVE
RBC, UA: NEGATIVE
Specific Gravity, UA: 1.022 (ref 1.005–1.030)
Urobilinogen, Ur: 0.2 mg/dL (ref 0.2–1.0)
pH, UA: 5 (ref 5.0–7.5)

## 2020-11-10 LAB — HEMOGLOBIN A1C
Est. average glucose Bld gHb Est-mCnc: 111 mg/dL
Hgb A1c MFr Bld: 5.5 % (ref 4.8–5.6)

## 2020-11-10 LAB — TSH: TSH: 0.216 u[IU]/mL — ABNORMAL LOW (ref 0.450–4.500)

## 2020-11-10 LAB — FOLLICLE STIMULATING HORMONE: FSH: 95.2 m[IU]/mL

## 2020-11-10 LAB — SODIUM, URINE, RANDOM: Sodium, Ur: 162 mmol/L

## 2020-11-10 LAB — T3, FREE: T3, Free: 2.1 pg/mL (ref 2.0–4.4)

## 2020-11-25 DIAGNOSIS — Z20822 Contact with and (suspected) exposure to covid-19: Secondary | ICD-10-CM | POA: Diagnosis not present

## 2020-11-26 ENCOUNTER — Other Ambulatory Visit: Payer: Self-pay | Admitting: Internal Medicine

## 2020-11-26 DIAGNOSIS — R748 Abnormal levels of other serum enzymes: Secondary | ICD-10-CM

## 2020-11-26 DIAGNOSIS — R7989 Other specified abnormal findings of blood chemistry: Secondary | ICD-10-CM

## 2020-12-11 ENCOUNTER — Other Ambulatory Visit: Payer: BC Managed Care – PPO

## 2020-12-24 DIAGNOSIS — K912 Postsurgical malabsorption, not elsewhere classified: Secondary | ICD-10-CM | POA: Diagnosis not present

## 2020-12-24 DIAGNOSIS — E0590 Thyrotoxicosis, unspecified without thyrotoxic crisis or storm: Secondary | ICD-10-CM | POA: Diagnosis not present

## 2020-12-24 DIAGNOSIS — R6889 Other general symptoms and signs: Secondary | ICD-10-CM | POA: Diagnosis not present

## 2020-12-26 ENCOUNTER — Encounter: Payer: Self-pay | Admitting: Internal Medicine

## 2020-12-26 NOTE — Telephone Encounter (Signed)
Please advise 

## 2020-12-29 ENCOUNTER — Emergency Department
Admission: EM | Admit: 2020-12-29 | Discharge: 2020-12-30 | Disposition: A | Discharge status: Home / Self Care | Payer: BC Managed Care – PPO | Attending: Emergency Medicine | Admitting: Emergency Medicine

## 2020-12-29 ENCOUNTER — Other Ambulatory Visit: Payer: Self-pay

## 2020-12-29 ENCOUNTER — Emergency Department: Payer: BC Managed Care – PPO

## 2020-12-29 DIAGNOSIS — H53149 Visual discomfort, unspecified: Secondary | ICD-10-CM | POA: Insufficient documentation

## 2020-12-29 DIAGNOSIS — Z20822 Contact with and (suspected) exposure to covid-19: Secondary | ICD-10-CM | POA: Insufficient documentation

## 2020-12-29 DIAGNOSIS — Z0389 Encounter for observation for other suspected diseases and conditions ruled out: Secondary | ICD-10-CM | POA: Diagnosis not present

## 2020-12-29 DIAGNOSIS — R110 Nausea: Secondary | ICD-10-CM | POA: Insufficient documentation

## 2020-12-29 DIAGNOSIS — R519 Headache, unspecified: Secondary | ICD-10-CM | POA: Diagnosis not present

## 2020-12-29 LAB — CBC WITH DIFFERENTIAL/PLATELET
Abs Immature Granulocytes: 0 10*3/uL (ref 0.00–0.07)
Basophils Absolute: 0 10*3/uL (ref 0.0–0.1)
Basophils Relative: 0 %
Eosinophils Absolute: 0.1 10*3/uL (ref 0.0–0.5)
Eosinophils Relative: 2 %
HCT: 43 % (ref 36.0–46.0)
Hemoglobin: 14.7 g/dL (ref 12.0–15.0)
Immature Granulocytes: 0 %
Lymphocytes Relative: 46 %
Lymphs Abs: 2.9 10*3/uL (ref 0.7–4.0)
MCH: 31.4 pg (ref 26.0–34.0)
MCHC: 34.2 g/dL (ref 30.0–36.0)
MCV: 91.9 fL (ref 80.0–100.0)
Monocytes Absolute: 0.4 10*3/uL (ref 0.1–1.0)
Monocytes Relative: 7 %
Neutro Abs: 2.8 10*3/uL (ref 1.7–7.7)
Neutrophils Relative %: 45 %
Platelets: 268 10*3/uL (ref 150–400)
RBC: 4.68 MIL/uL (ref 3.87–5.11)
RDW: 12.8 % (ref 11.5–15.5)
WBC: 6.3 10*3/uL (ref 4.0–10.5)
nRBC: 0 % (ref 0.0–0.2)

## 2020-12-29 LAB — COMPREHENSIVE METABOLIC PANEL
ALT: 58 U/L — ABNORMAL HIGH (ref 0–44)
AST: 146 U/L — ABNORMAL HIGH (ref 15–41)
Albumin: 3.7 g/dL (ref 3.5–5.0)
Alkaline Phosphatase: 64 U/L (ref 38–126)
Anion gap: 9 (ref 5–15)
BUN: 21 mg/dL — ABNORMAL HIGH (ref 6–20)
CO2: 26 mmol/L (ref 22–32)
Calcium: 9 mg/dL (ref 8.9–10.3)
Chloride: 103 mmol/L (ref 98–111)
Creatinine, Ser: 1.04 mg/dL — ABNORMAL HIGH (ref 0.44–1.00)
GFR, Estimated: >60 mL/min (ref >60)
Glucose, Bld: 75 mg/dL (ref 70–99)
Potassium: 4.1 mmol/L (ref 3.5–5.1)
Sodium: 138 mmol/L (ref 135–145)
Total Bilirubin: 0.6 mg/dL (ref 0.3–1.2)
Total Protein: 7.4 g/dL (ref 6.5–8.1)

## 2020-12-29 LAB — RESP PANEL BY RT-PCR (FLU A&B, COVID) ARPGX2
Influenza A by PCR: NEGATIVE
Influenza B by PCR: NEGATIVE
SARS Coronavirus 2 by RT PCR: NEGATIVE

## 2020-12-29 MED ORDER — KETOROLAC TROMETHAMINE 30 MG/ML IJ SOLN
30.0000 mg | Freq: Once | INTRAMUSCULAR | Status: AC
  Administered 2020-12-29: 30 mg via INTRAVENOUS
  Filled 2020-12-29: qty 1

## 2020-12-29 MED ORDER — SODIUM CHLORIDE 0.9 % IV BOLUS
1000.0000 mL | Freq: Once | INTRAVENOUS | Status: AC
  Administered 2020-12-29: 1000 mL via INTRAVENOUS

## 2020-12-29 MED ORDER — DIPHENHYDRAMINE HCL 50 MG/ML IJ SOLN
50.0000 mg | Freq: Once | INTRAMUSCULAR | Status: AC
  Administered 2020-12-29: 50 mg via INTRAVENOUS
  Filled 2020-12-29: qty 1

## 2020-12-29 MED ORDER — METOCLOPRAMIDE HCL 5 MG/ML IJ SOLN
10.0000 mg | Freq: Once | INTRAMUSCULAR | Status: AC
  Administered 2020-12-29: 10 mg via INTRAVENOUS
  Filled 2020-12-29: qty 2

## 2020-12-29 NOTE — ED Provider Notes (Signed)
[facility]-EMERGENCY DEPARTMENT  ____________________________________________  Time seen: Approximately 9:05 PM  I have reviewed the triage vital signs and the nursing notes.   HISTORY  Chief Complaint Headache   Historian Patient     HPI Valerie Colon is a 50 y.o. female presents to the emergency department with headache.  Patient states that headache feels like a burst of pain that starts at the back of her head and radiates around her forehead like a band.  Patient states that she has had headache intermittently for the past week.  Patient states that she has a history of headaches and has been prescribed Imitrex in the past by her PCP.  She has had some photophobia and phonophobia as well as nausea but no vomiting.  She denies falls or mechanisms of trauma.  Her last eye exam was in February and she reports that she has been staying hydrated at home.  No changes in caffeine or nicotine intake.  She has not started any new medications.   Past Medical History:  Diagnosis Date  . Acid reflux   . Allergic rhinitis   . Allergy   . Anxiety   . Heart murmur   . History of hiatal hernia   . Kidney stones   . Mass of finger    myxoid neurofibroma multiple  . Migraine   . Ovarian cyst, left      Immunizations up to date:  Yes.     Past Medical History:  Diagnosis Date  . Acid reflux   . Allergic rhinitis   . Allergy   . Anxiety   . Heart murmur   . History of hiatal hernia   . Kidney stones   . Mass of finger    myxoid neurofibroma multiple  . Migraine   . Ovarian cyst, left     Patient Active Problem List   Diagnosis Date Noted  . 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  . ANA positive 05/16/2020  . Hiatal hernia 05/16/2020  . Benign cyst of right kidney 05/16/2020  . Annual physical exam 05/06/2019  . Family history of osteoporosis 05/07/2018  . Cyst of left ovary 05/07/2018  . Cardiac murmur 02/12/2018  . Postprandial hypoglycemia 02/12/2018  . 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 Epigastric abdominal pain 11/13/2017  . Hypotension 11/13/2017  . Chronic gastritis 10/09/2017  . Migraine headache without aura 10/09/2017  . Soft tissue lesion of elbow region 10/09/2017  . Chronic pain of both knees 10/09/2017  . Shoulder pain, right 10/09/2017  . Pelvic pain 10/09/2017  . Breast calcifications 10/09/2017  . Increased frequency of headaches 10/02/2017  . Snoring 07/31/2017  . Sprain of ligament of tarsometatarsal joint 04/01/2016  . Nephrolithiasis 02/29/2016  . Chronic fatigue 01/24/2016  . Dizziness 01/24/2016  . Gastroesophageal reflux disease without esophagitis 01/24/2016  . GAD (generalized anxiety disorder) 11/20/2015  . Emotional stress 11/09/2015  . Intermittent palpitations 11/09/2015  . Vitamin D deficiency 07/09/2015  . Allergic rhinitis 03/29/2015  . Chronic anemia 01/08/2015  . Prediabetes 01/08/2015  . Obstructive apnea 01/20/2014  . Hypoglycemia after GI (gastrointestinal) surgery 11/26/2012    Past Surgical History:  Procedure Laterality Date  . ABDOMINAL HYSTERECTOMY     2008; left ovary still intact  . BREAST BIOPSY Right 12/19/2013   stereo right- neg  . CHOLECYSTECTOMY    . COLONOSCOPY WITH PROPOFOL N/A 05/05/2016   Procedure: COLONOSCOPY WITH PROPOFOL;  Surgeon: Lollie Sails, MD;  Location: [facility] ENDOSCOPY;  Service: Endoscopy;  Laterality: N/A;  . ESOPHAGOGASTRODUODENOSCOPY (EGD) WITH PROPOFOL N/A 05/05/2016   Procedure: ESOPHAGOGASTRODUODENOSCOPY (EGD) WITH PROPOFOL;  Surgeon: Lollie Sails, MD;  Location: [facility] ENDOSCOPY;  Service: Endoscopy;  Laterality: N/A;  . EXTRACORPOREAL SHOCK WAVE LITHOTRIPSY Left 11/06/2016   Procedure: EXTRACORPOREAL SHOCK WAVE LITHOTRIPSY (ESWL);  Surgeon: Nickie Retort, MD;  Location: [facility] ORS;  Service: Urology;  Laterality: Left;  . gallbladder  2012  . GASTRIC BYPASS  2012  . REDUCTION MAMMAPLASTY Bilateral    2010    Prior to Admission  medications   Medication Sig Start Date End Date Taking? Authorizing Provider  CALCIUM PO Take by mouth.    [provider]  Cholecalciferol (VITAMIN D3 PO) Take by mouth.    [provider]  Cyanocobalamin 1000 MCG TBCR Take by mouth.    [provider]  ferrous sulfate 325 (65 FE) MG EC tablet Take 325 mg by mouth 3 (three) times daily with meals.    [provider]  Magnesium 400 MG CAPS Take by mouth.    [provider]  Multiple Vitamin (MULTIVITAMIN) tablet Take 1 tablet by mouth daily.    [provider]  SUMAtriptan (IMITREX) 100 MG tablet Take 1 tablet (100 mg total) by mouth every 2 (two) hours as needed for migraine. May repeat in 2 hours if headache persists or recurs. Max dose 200 mg/24 hours 11/09/20   McLean-Scocuzza, Nino Glow, MD    Allergies Patient has no known allergies.  Family History  Problem Relation Age of Onset  . Prostate cancer Father   . Hyperlipidemia Father   . Hypertension Father   . Depression Father   . Kidney Stones Maternal Grandfather   . Stroke Maternal Grandfather   . Cancer Maternal Grandfather        prostate cancer   . Diabetes Mother   . Hyperlipidemia Mother   . Hypertension Mother   . Osteoporosis Mother        ? if has this   . Multiple sclerosis Sister   . Diabetes Sister   . Heart disease Maternal Grandmother   . Cancer Other        breast cancer   . Breast cancer Other   . Osteoporosis Maternal Aunt   . Kidney disease Neg Hx   . Bladder Cancer Neg Hx     Social History Social History   Tobacco Use  . Smoking status: Never Smoker  . Smokeless tobacco: Never Used  Substance Use Topics  . Alcohol use: No    Alcohol/week: 0.0 standard drinks  . Drug use: No     Review of Systems  Constitutional: No fever/chills Eyes:  No discharge ENT: No upper respiratory complaints. Respiratory: no cough. No SOB/ use of accessory muscles to breath Gastrointestinal:   No nausea,  no vomiting.  No diarrhea.  No constipation. Musculoskeletal: Negative for musculoskeletal pain. Neuro: Patient has headache Skin: Negative for rash, abrasions, lacerations, ecchymosis. ____________________________________________   PHYSICAL EXAM:  VITAL SIGNS: ED Triage Vitals  Enc Vitals Group     BP 12/29/20 1932 (!) 145/84     Pulse Rate 12/29/20 1932 64     Resp 12/29/20 1932 12     Temp 12/29/20 1932 97.8 F (36.6 C)     Temp Source 12/29/20 1932 Oral     SpO2 12/29/20 1932 99 %     Weight 12/29/20 1933 193 lb (87.5 kg)     Height 12/29/20 1933 5\' 5"  (1.651  m)     Head Circumference --      Peak Flow --      Pain Score 12/29/20 1932 7     Pain Loc --      Pain Edu? --      Excl. in [place]? --      Constitutional: Alert and oriented. Well appearing and in no acute distress. Eyes: Conjunctivae are normal. PERRL. EOMI. Head: Atraumatic. ENT:      Nose: No congestion/rhinnorhea.      Mouth/Throat: Mucous membranes are moist.  Neck: No stridor.  FROM.  Cardiovascular: Normal rate, regular rhythm. Normal S1 and S2.  Good peripheral circulation. Respiratory: Normal respiratory effort without tachypnea or retractions. Lungs CTAB. Good air entry to the bases with no decreased or absent breath sounds Gastrointestinal: Bowel sounds x 4 quadrants. Soft and nontender to palpation. No guarding or rigidity. No distention. Musculoskeletal: Full range of motion to all extremities. No obvious deformities noted Neurologic:  Normal for age. No gross focal neurologic deficits are appreciated.  Skin:  Skin is warm, dry and intact. No rash noted. Psychiatric: Mood and affect are normal for age. Speech and behavior are normal.   ____________________________________________   LABS (all labs ordered are listed, but only abnormal results are displayed)  Labs Reviewed  COMPREHENSIVE METABOLIC PANEL - Abnormal; Notable for the following components:      Result Value   BUN 21 (*)     Creatinine, Ser 1.04 (*)    AST 146 (*)    ALT 58 (*)    All other components within normal limits  RESP PANEL BY RT-PCR (FLU A&B, COVID) ARPGX2  CBC WITH DIFFERENTIAL/PLATELET   ____________________________________________  EKG   ____________________________________________  RADIOLOGY Unk Pinto, personally viewed and evaluated these images (plain radiographs) as part of my medical decision making, as well as reviewing the written report by the radiologist.    CT Head Wo Contrast  Result Date: 12/29/2020 CLINICAL DATA:  Cerebral hemorrhage suspected. EXAM: CT HEAD WITHOUT CONTRAST TECHNIQUE: Contiguous axial images were obtained from the base of the skull through the vertex without intravenous contrast. COMPARISON:  None. FINDINGS: Brain: No evidence of acute infarction, hemorrhage, hydrocephalus, extra-axial collection or mass lesion/mass effect. Vascular: No hyperdense vessel or unexpected calcification. Skull: Normal. Negative for fracture or focal lesion. Sinuses/Orbits: Visualized paranasal sinuses and mastoid air cells are predominantly clear. Pneumatized petrous apices. Orbits are grossly unremarkable. Other: None IMPRESSION: No acute intracranial findings. Electronically Signed   By: Dahlia Bailiff MD   On: 12/29/2020 21:44    ____________________________________________    PROCEDURES  Procedure(s) performed:     Procedures     Medications  sodium chloride 0.9 % bolus 1,000 mL (1,000 mLs Intravenous New Bag/Given 12/29/20 2113)  metoCLOPramide (REGLAN) injection 10 mg (10 mg Intravenous Given 12/29/20 2114)  diphenhydrAMINE (BENADRYL) injection 50 mg (50 mg Intravenous Given 12/29/20 2114)  ketorolac (TORADOL) 30 MG/ML injection 30 mg (30 mg Intravenous Given 12/29/20 2209)     ____________________________________________   INITIAL IMPRESSION / ASSESSMENT AND PLAN / ED COURSE  Pertinent labs & imaging results that were available during my care of the  patient were reviewed by me and considered in my medical decision making (see chart for details).      Assessment and Plan:  Headache 50 year old female presents to the emergency department with occipital headache that radiates around to her forehead that has occurred intermittently for the past week.  Patient was hypertensive at triage but  vital signs were otherwise reassuring.  She had no neuro deficits on exam.  Will obtain CT head and will give fluids, Reglan and Benadryl and will reassess.  CT head shows no signs of intracranial bleed.  Patient received Reglan and Benadryl in the emergency department.  After CT head return, patient was given Toradol.  Patient reported that her headache completely resolved.   ____________________________________________  FINAL CLINICAL IMPRESSION(S) / ED DIAGNOSES  Final diagnoses:  Acute nonintractable headache, unspecified headache type      NEW MEDICATIONS STARTED DURING THIS VISIT:  ED Discharge Orders    None          This chart was dictated using voice recognition software/Dragon. Despite best efforts to proofread, errors can occur which can change the meaning. Any change was purely unintentional.     Karren Cobble 12/29/20 2338    Nance Pear, MD 12/30/20 1859

## 2020-12-29 NOTE — ED Triage Notes (Signed)
Intermittent pressure to back of head radiating to sides x1wk, denies injuries or eye complaints

## 2020-12-29 NOTE — ED Notes (Signed)
Room darkened for patient's comfort. 

## 2020-12-30 NOTE — ED Notes (Signed)
E signature pad not working. Pt educated on discharge instructions and verbalized understanding.  

## 2020-12-31 ENCOUNTER — Other Ambulatory Visit: Payer: Self-pay | Admitting: Internal Medicine

## 2020-12-31 ENCOUNTER — Telehealth: Payer: Self-pay | Admitting: Internal Medicine

## 2020-12-31 DIAGNOSIS — R600 Localized edema: Secondary | ICD-10-CM

## 2020-12-31 DIAGNOSIS — R1900 Intra-abdominal and pelvic swelling, mass and lump, unspecified site: Secondary | ICD-10-CM

## 2020-12-31 MED ORDER — FUROSEMIDE 20 MG PO TABS: 20.0000 mg | ORAL_TABLET | Freq: Every day | ORAL | 0 refills | Status: DC | PRN

## 2020-12-31 NOTE — Telephone Encounter (Signed)
Faxed via epic routing. See 12/26/20 Patient message encounter

## 2020-12-31 NOTE — Telephone Encounter (Signed)
Inform patient and  Fax this note to Dr. Jeanette Caprice endocrine 1. pt is concerned with fluctuating thyroid labs  This really Is an issue for endocrine NOT PCP  If he can f/u with patient and get in sooner than 04/2021 if she is concerned about fatigue, weight flucuations  2. We can order a d dimer to make sure negative and then leg swelling is not related to a blood clot if + we have to work up with ultrasound of legs   3. Also does she want echo of heart to work up leg swelling ?  4. Also does patient want referral to neurology Dr. Manuella Ghazi for headaches and dizziness ?  When dizzy she should check her blood sugar as she runs low at time s  Results for Valerie, Colon (MRN [id number]) as of 12/31/2020 02:43  Ref. Range 11/09/2020 14:24  TSH Latest Ref Range: 0.450 - 4.500 uIU/mL 0.216 (L)  Triiodothyronine,Free,Serum Latest Ref Range: 2.0 - 4.4 pg/mL 2.1  T4,Free(Direct) Latest Ref Range: 0.82 - 1.77 ng/dL 0.67 (L)  Thyroperoxidase Ab SerPl-aCnc Latest Ref Range: 0 - 34 IU/mL 24    Dr. Olivia Mackie McLean-Scocuzza

## 2021-01-03 ENCOUNTER — Other Ambulatory Visit: Payer: Self-pay

## 2021-01-03 ENCOUNTER — Other Ambulatory Visit (INDEPENDENT_AMBULATORY_CARE_PROVIDER_SITE_OTHER): Payer: BC Managed Care – PPO

## 2021-01-03 DIAGNOSIS — Z1231 Encounter for screening mammogram for malignant neoplasm of breast: Secondary | ICD-10-CM

## 2021-01-03 DIAGNOSIS — R7989 Other specified abnormal findings of blood chemistry: Secondary | ICD-10-CM

## 2021-01-03 DIAGNOSIS — E2839 Other primary ovarian failure: Secondary | ICD-10-CM

## 2021-01-03 DIAGNOSIS — R748 Abnormal levels of other serum enzymes: Secondary | ICD-10-CM

## 2021-01-03 LAB — T4, FREE: Free T4: 0.71 ng/dL (ref 0.60–1.60)

## 2021-01-03 LAB — COMPREHENSIVE METABOLIC PANEL
ALT: 31 U/L (ref 0–35)
AST: 30 U/L (ref 0–37)
Albumin: 4.1 g/dL (ref 3.5–5.2)
Alkaline Phosphatase: 72 U/L (ref 39–117)
BUN: 22 mg/dL (ref 6–23)
CO2: 31 mEq/L (ref 19–32)
Calcium: 9.4 mg/dL (ref 8.4–10.5)
Chloride: 100 mEq/L (ref 96–112)
Creatinine, Ser: 1.14 mg/dL (ref 0.40–1.20)
GFR: 56.62 mL/min — ABNORMAL LOW (ref >60.00)
Glucose, Bld: 90 mg/dL (ref 70–99)
Potassium: 4.4 mEq/L (ref 3.5–5.1)
Sodium: 137 mEq/L (ref 135–145)
Total Bilirubin: 0.5 mg/dL (ref 0.2–1.2)
Total Protein: 7.6 g/dL (ref 6.0–8.3)

## 2021-01-03 LAB — TSH: TSH: 1.53 u[IU]/mL (ref 0.35–4.50)

## 2021-01-03 LAB — T3, FREE: T3, Free: 3.9 pg/mL (ref 2.3–4.2)

## 2021-01-04 LAB — THYROID PEROXIDASE ANTIBODY: Thyroperoxidase Ab SerPl-aCnc: 1 IU/mL (ref <9)

## 2021-01-10 ENCOUNTER — Telehealth (INDEPENDENT_AMBULATORY_CARE_PROVIDER_SITE_OTHER): Payer: BC Managed Care – PPO | Admitting: Internal Medicine

## 2021-01-10 ENCOUNTER — Encounter: Payer: Self-pay | Admitting: Internal Medicine

## 2021-01-10 VITALS — Ht 65.0 in | Wt 200.0 lb

## 2021-01-10 DIAGNOSIS — R110 Nausea: Secondary | ICD-10-CM | POA: Diagnosis not present

## 2021-01-10 DIAGNOSIS — Z8669 Personal history of other diseases of the nervous system and sense organs: Secondary | ICD-10-CM

## 2021-01-10 DIAGNOSIS — G43011 Migraine without aura, intractable, with status migrainosus: Secondary | ICD-10-CM

## 2021-01-10 DIAGNOSIS — R946 Abnormal results of thyroid function studies: Secondary | ICD-10-CM | POA: Insufficient documentation

## 2021-01-10 DIAGNOSIS — R42 Dizziness and giddiness: Secondary | ICD-10-CM

## 2021-01-10 DIAGNOSIS — G4453 Primary thunderclap headache: Secondary | ICD-10-CM

## 2021-01-10 MED ORDER — NURTEC 75 MG PO TBDP: 1.0000 | ORAL_TABLET | Freq: Every day | ORAL | 0 refills | Status: DC | PRN

## 2021-01-10 MED ORDER — ONDANSETRON 4 MG PO TBDP: 4.0000 mg | ORAL_TABLET | Freq: Three times a day (TID) | ORAL | 0 refills | Status: DC | PRN

## 2021-01-10 NOTE — Progress Notes (Signed)
Virtual Visit via Video Note  I connected with Valerie Colon  on 01/10/21 at  3:50 PM EDT by a video enabled telemedicine application and verified that I am speaking with the correct person using two identifiers.  Location patient: home, [place] Location provider:work or home office Persons participating in the virtual visit: patient, provider  I discussed the limitations of evaluation and management by telemedicine and the availability of in person appointments. The patient expressed understanding and agreed to proceed.   HPI:  Acute telemedicine visit for : 1. New presentation of h/a with h/o migraines but this is different than prior migraines has not had in 2 years. Daily Ha x 3 weeks 12/29/20 ct head negative at times 8/10 today h/a 5/10 pressure like pounding with episodic intensity up and down and nausea and dizziness. Denies visual changes and neck pain. Distribution of h/a is occipital and left side of head tried imitex 100 mg qhs q/o relief and really bad intensity of h/a 2x w/in the last 3 weeks though h/a has been daily  BP not checked today but likely elevated in the ed due to pain  12/29/20 pain intensity so bad she thought she was going to die   2. Variable thyroid labs est with [facility] endocrine will CC Dr. Honor Junes to see if he can schedule thyroid US  Results for Valerie, Colon (MRN [id number]) as of 01/10/2021 16:29  Ref. Range 10/28/2018 13:48 12/03/2018 08:49 03/07/2020 08:17 11/09/2020 14:24 01/03/2021 08:27  TSH Latest Ref Range: 0.35 - 4.50 uIU/mL <0.01 (L) 0.66 0.83 0.216 (L) 1.53  Triiodothyronine,Free,Serum Latest Ref Range: 2.3 - 4.2 pg/mL    2.1 3.9  T4,Free(Direct) Latest Ref Range: 0.60 - 1.60 ng/dL   0.76 0.67 (L) 0.71  Thyroxine (T4) Latest Ref Range: 5.1 - 11.9 mcg/dL 1.9 (L) 5.7     Free Thyroxine Index Latest Ref Range: 1.4 - 3.8  0.6 (L) 2.1     Thyroperoxidase Ab SerPl-aCnc Latest Ref Range: <9 IU/mL    24 1  T3 Uptake Latest Ref Range: 22 - 35 % 32 37 (H)        -COVID-19 vaccine status: 3/3 moderna   ROS: See pertinent positives and negatives per HPI.  Past Medical History:  Diagnosis Date  . Acid reflux   . Allergic rhinitis   . Allergy   . Anxiety   . COVID-19    09/2020  . Heart murmur   . History of hiatal hernia   . Kidney stones   . Mass of finger    myxoid neurofibroma multiple  . Migraine   . Ovarian cyst, left     Past Surgical History:  Procedure Laterality Date  . ABDOMINAL HYSTERECTOMY     2008; left ovary still intact  . BREAST BIOPSY Right 12/19/2013   stereo right- neg  . CHOLECYSTECTOMY    . COLONOSCOPY WITH PROPOFOL N/A 05/05/2016   Procedure: COLONOSCOPY WITH PROPOFOL;  Surgeon: Lollie Sails, MD;  Location[facility] ENDOSCOPY;  Service: Endoscopy;  Laterality: N/A;  . ESOPHAGOGASTRODUODENOSCOPY (EGD) WITH PROPOFOL N/A 05/05/2016   Procedure: ESOPHAGOGASTRODUODENOSCOPY (EGD) WITH PROPOFOL;  Surgeon: Lollie Sails, MD;  Location: [facility] ENDOSCOPY;  Service: Endoscopy;  Laterality: N/A;  . EXTRACORPOREAL SHOCK WAVE LITHOTRIPSY Left 11/06/2016   Procedure: EXTRACORPOREAL SHOCK WAVE LITHOTRIPSY (ESWL);  Surgeon: Nickie Retort, MD;  Location: [facility] ORS;  Service: Urology;  Laterality: Left;  . gallbladder  2012  . GASTRIC BYPASS  2012  . REDUCTION MAMMAPLASTY Bilateral  2010     Current Outpatient Medications:  .  CALCIUM PO, Take by mouth., Disp: , Rfl:  .  Cholecalciferol (VITAMIN D3 PO), Take by mouth., Disp: , Rfl:  .  ferrous sulfate 325 (65 FE) MG EC tablet, Take 325 mg by mouth 3 (three) times daily with meals., Disp: , Rfl:  .  Multiple Vitamin (MULTIVITAMIN) tablet, Take 1 tablet by mouth daily., Disp: , Rfl:  .  ondansetron (ZOFRAN ODT) 4 MG disintegrating tablet, Take 1 tablet (4 mg total) by mouth every 8 (eight) hours as needed for nausea or vomiting., Disp: 40 tablet, Rfl: 0 .  Rimegepant Sulfate (NURTEC) 75 MG TBDP, Take 1 tablet by mouth daily as needed. Max dose 75 mg in 24 hours as  needed for headache d/c imitrex, Disp: 10 tablet, Rfl: 0 .  Cyanocobalamin 1000 MCG TBCR, Take by mouth., Disp: , Rfl:  .  furosemide (LASIX) 20 MG tablet, Take 1 tablet (20 mg total) by mouth daily as needed. (Patient not taking: Reported on 01/10/2021), Disp: 30 tablet, Rfl: 0 .  Magnesium 400 MG CAPS, Take by mouth. (Patient not taking: Reported on 01/10/2021), Disp: , Rfl:   EXAM:  VITALS per patient if applicable:  GENERAL: alert, oriented, appears well and in no acute distress  HEENT: atraumatic, conjunttiva clear, no obvious abnormalities on inspection of external nose and ears  NECK: normal movements of the head and neck  LUNGS: on inspection no signs of respiratory distress, breathing rate appears normal, no obvious gross SOB, gasping or wheezing  CV: no obvious cyanosis  MS: moves all visible extremities without noticeable abnormality  PSYCH/NEURO: pleasant and cooperative, no obvious depression or anxiety, speech and thought processing grossly intact  ASSESSMENT AND PLAN:  Discussed the following assessment and plan:  Primary thunderclap headache r/o aneurysm other ddx is intractable migraine with history- Plan: MR Angiogram Head Wo Contrast, MR Brain Wo Contrast, Rimegepant Sulfate (NURTEC) 75 MG TBDP Stop imitrex  Prn zofran odt prn May need to [place] with neurology seen [facility] neurology in the past also disc Dr. Jaynee Eagles in the future   2. Abnormal thyroid labs  Consider thyroid US [place] with endocrine   Ref. Range 10/28/2018 13:48 12/03/2018 08:49 03/07/2020 08:17 11/09/2020 14:24 01/03/2021 08:27  TSH Latest Ref Range: 0.35 - 4.50 uIU/mL <0.01 (L) 0.66 0.83 0.216 (L) 1.53  Triiodothyronine,Free,Serum Latest Ref Range: 2.3 - 4.2 pg/mL    2.1 3.9  T4,Free(Direct) Latest Ref Range: 0.60 - 1.60 ng/dL   0.76 0.67 (L) 0.71  Thyroxine (T4) Latest Ref Range: 5.1 - 11.9 mcg/dL 1.9 (L) 5.7     Free Thyroxine Index Latest Ref Range: 1.4 - 3.8  0.6 (L) 2.1     Thyroperoxidase Ab  SerPl-aCnc Latest Ref Range: <9 IU/mL    24 1  T3 Uptake Latest Ref Range: 22 - 35 % 32 37 (H)       -we discussed possible serious and likely etiologies, options for evaluation and workup, limitations of telemedicine visit vs in person visit, treatment, treatment risks and precautions. Pt prefers to treat via telemedicine empirically rather than in person at this moment.    I discussed the assessment and treatment plan with the patient. The patient was provided an opportunity to ask questions and all were answered. The patient agreed with the plan and demonstrated an understanding of the instructions.    Time spent 20 minutes Delorise Jackson, MD

## 2021-01-10 NOTE — Progress Notes (Signed)
Ongoing migraine, states this head pain feels different.   3 weeks ago started with ED visit. Back of the head intense pressure like pain. Everyday since then she had been having headache,pain, pressure on the left side of the head.   No vision issues. Patient having nausea and dizziness. No visual changes.

## 2021-01-11 ENCOUNTER — Other Ambulatory Visit: Payer: Self-pay | Admitting: Internal Medicine

## 2021-01-11 ENCOUNTER — Encounter: Payer: Self-pay | Admitting: Internal Medicine

## 2021-01-11 DIAGNOSIS — G43911 Migraine, unspecified, intractable, with status migrainosus: Secondary | ICD-10-CM

## 2021-01-11 MED ORDER — BUTALBITAL-APAP-CAFFEINE 50-325-40 MG PO TABS: 1.0000 | ORAL_TABLET | Freq: Two times a day (BID) | ORAL | 0 refills | Status: DC | PRN

## 2021-01-16 ENCOUNTER — Other Ambulatory Visit: Payer: Self-pay | Admitting: Internal Medicine

## 2021-01-16 NOTE — Addendum Note (Signed)
Addended by: Orland Mustard on: 01/16/2021 05:14 PM   Modules accepted: Orders

## 2021-01-16 NOTE — Telephone Encounter (Signed)
Prior authorization has been submitted for patient's Nurtec  Awaiting approval or denial.

## 2021-01-29 ENCOUNTER — Encounter: Payer: Self-pay | Admitting: Neurology

## 2021-01-29 ENCOUNTER — Ambulatory Visit: Payer: BC Managed Care – PPO | Admitting: Neurology

## 2021-01-29 VITALS — BP 127/63 | HR 75 | Ht 66.0 in | Wt 201.6 lb

## 2021-01-29 DIAGNOSIS — H539 Unspecified visual disturbance: Secondary | ICD-10-CM

## 2021-01-29 DIAGNOSIS — R51 Headache with orthostatic component, not elsewhere classified: Secondary | ICD-10-CM | POA: Diagnosis not present

## 2021-01-29 DIAGNOSIS — G43009 Migraine without aura, not intractable, without status migrainosus: Secondary | ICD-10-CM | POA: Diagnosis not present

## 2021-01-29 DIAGNOSIS — R519 Headache, unspecified: Secondary | ICD-10-CM

## 2021-01-29 MED ORDER — TOPIRAMATE 50 MG PO TABS: 50.0000 mg | ORAL_TABLET | Freq: Every evening | ORAL | 6 refills | Status: DC

## 2021-01-29 MED ORDER — RIZATRIPTAN BENZOATE 10 MG PO TBDP: 10.0000 mg | ORAL_TABLET | ORAL | 11 refills | Status: DC | PRN

## 2021-01-29 NOTE — Patient Instructions (Addendum)
MRI of the brain w/wo contrast Prevention: Start Topiramate for prevention of migraines Topiramate: Start at 50mg  every night Acute/Emergency: Rizatriptan Please take one tablet at the onset of your headache. If it does not improve the symptoms please take one additional tablet. Do not take more then 2 tablets in 24hrs. Do not take use more then 2 to 3 times in a week.Can take with ondansetron. Take immediately.   If above doesn't help:  Next try Propranolol, can also consider nortriptyline/amitriptyline for prevention. Other medications include new injections like Ajovy.  Next for acute/emergency could probably try for Ubrelvy since tried sumatriptan and rizatriptan  Topiramate tablets What is this medication? TOPIRAMATE (toe PYRE a mate) treats seizures in people with epilepsy. It is also used to prevent migraines. It works by calming overactive nerves in yourbody. This medicine may be used for other purposes; ask your health care provider orpharmacist if you have questions. COMMON BRAND NAME(S): Topamax, Topiragen What should I tell my care team before I take this medication? They need to know if you have any of these conditions: Bleeding disorder Kidney disease Lung disease Suicidal thoughts, plans or attempt An unusual or allergic reaction to topiramate, other medications, foods, dyes, or preservatives Pregnant or trying to get pregnant Breast-feeding How should I use this medication? Take this medication by mouth with water. Take it as directed on the prescription label at the same time every day. Do not cut, crush or chew this medicine. Swallow the tablets whole. You can take it with or without food. If it upsets your stomach, take it with food. Keep taking it unless your care teamtells you to stop. A special MedGuide will be given to you by the pharmacist with eachprescription and refill. Be sure to read this information carefully each time. Talk to your care team about the use of  this medication in children. While it may be prescribed for children as young as 2 years for selected conditions,precautions do apply. Overdosage: If you think you have taken too much of this medicine contact apoison control center or emergency room at once. NOTE: This medicine is only for you. Do not share this medicine with others. What if I miss a dose? If you miss a dose, take it as soon as you can unless it is within 6 hours of the next dose. If it is within 6 hours of the next dose, skip the missed dose.Take the next dose at the normal time. Do not take double or extra doses. What may interact with this medication? Acetazolamide Alcohol Antihistamines for allergy, cough, and cold Aspirin and aspirin-like medications Atropine Birth control pills Certain medications for anxiety or sleep Certain medications for bladder problems like oxybutynin, tolterodine Certain medications for depression like amitriptyline, fluoxetine, sertraline Certain medications for Parkinson's disease like benztropine, trihexyphenidyl Certain medications for seizures like carbamazepine, lamotrigine, phenobarbital, phenytoin, primidone, valproic acid, zonisamide Certain medications for stomach problems like dicyclomine, hyoscyamine Certain medications for travel sickness like scopolamine Certain medications that treat or prevent blood clots like warfarin, enoxaparin, dalteparin, apixaban, dabigatran, and rivaroxaban Digoxin Diltiazem General anesthetics like halothane, isoflurane, methoxyflurane, propofol Glyburide Hydrochlorothiazide Ipratropium Lithium Medications that relax muscles Metformin Narcotic medications for pain NSAIDs, medications for pain and inflammation, like ibuprofen or naproxen Phenothiazines like chlorpromazine, mesoridazine, prochlorperazine, thioridazine Pioglitazone This list may not describe all possible interactions. Give your health care provider a list of all the medicines, herbs,  non-prescription drugs, or dietary supplements you use. Also tell them if you smoke, drink alcohol,  or use illegaldrugs. Some items may interact with your medicine. What should I watch for while using this medication? Visit your care team for regular checks on your progress. Tell your care teamif your symptoms do not start to get better or if they get worse. Do not suddenly stop taking this medication. You may develop a severe reaction. Your care team will tell you how much medication to take. If your care team wants you to stop the medication, the dose may be slowly lowered over time toavoid any side effects. Wear a medical ID bracelet or chain. Carry a card that describes yourcondition. List the medications and doses you take on the card. You may get drowsy or dizzy. Do not drive, use machinery, or do anything that needs mental alertness until you know how this medication affects you. Do not stand up or sit up quickly, especially if you are an older patient. This reduces the risk of dizzy or fainting spells. Alcohol may interfere with theeffects of this medication. Avoid alcoholic drinks. This medication may cause serious skin reactions. They can happen weeks to months after starting the medication. Contact your care team right away if you notice fevers or flu-like symptoms with a rash. The rash may be red or purple and then turn into blisters or peeling of the skin. Or, you might notice a red rash with swelling of the face, lips or lymph nodes in your neck or under yourarms. Watch for new or worsening thoughts of suicide or depression. This includes sudden changes in mood, behaviors, or thoughts. These changes can happen at any time but are more common in the beginning of treatment or after a change in dose. Call your care team right away if you experience these thoughts orworsening depression. This medication may slow your child's growth if it is taken for a long time athigh doses. Your care team will  monitor your child's growth. Using this medication for a long time may weaken your bones. The risk of bonefractures may be increased. Talk to your care team about your bone health. Do not become pregnant while taking this medication. Hormone forms of birth control may not work as well with this medication. Talk to your care team about other forms of birth control. There is potential for serious harm to an unbornchild. Tell your care team right away if you think you might be pregnant. What side effects may I notice from receiving this medication? Side effects that you should report to your care team as soon as possible: Allergic reactions-skin rash, itching, hives, swelling of the face, lips, tongue, or throat High acid levels-trouble breathing, fast irregular heartbeat, headache, confusion, unusually weak or tired, nausea, vomiting High ammonia levels-unusual weakness or fatigue, confusion, loss of appetite, nausea, vomiting, seizures High fever, fever that does not go away, or decreased sweating Kidney stones-blood in the urine, pain or trouble passing urine, pain in the lower back or sides Redness, blistering, peeling or loosening of the skin, including inside the mouth Sudden eye pain or change in vision such as blurry vision, seeing halos around lights, vision loss Thoughts of suicide or self-harm, worsening mood, feelings of depression Side effects that usually do not require medical attention (report to your careteam if they continue or are bothersome): Anxiety, nervousness Change in taste Diarrhea Dizziness Drowsiness Fatigue Loss of appetite with weight loss Pain, tingling, or numbness in the hands or feet Trouble concentrating Trouble speaking This list may not describe all possible side effects. Call your doctor  for medical advice about side effects. You may report side effects to FDA at1-800-FDA-1088. Where should I keep my medication? Keep out of the reach of children and  pets. Store between 15 and 30 degrees C (59 and 86 degrees F). Protect from moisture. Keep the container tightly closed. Get rid of any unused medication after theexpiration date. To get rid of medications that are no longer needed or have expired: Take the medication to a medication take-back program. Check with your pharmacy or law enforcement to find a location. If you cannot return the medication, check the label or package insert to see if the medication should be thrown out in the garbage or flushed down the toilet. If you are not sure, ask your care team. If it is safe to put it in the trash, empty the medication out of the container. Mix the medication with cat litter, dirt, coffee grounds, or other unwanted substance. Seal the mixture in a bag or container. Put it in the trash. NOTE: This sheet is a summary. It may not cover all possible information. If you have questions about this medicine, talk to your doctor, pharmacist, orhealth care provider.  2022 [employer]vier/Gold Standard (2020-09-17 10:05:08)

## 2021-01-29 NOTE — Progress Notes (Signed)
[facility]    Provider:  Dr Jaynee Eagles Requesting Provider: McLean-Scocuzza, Olivia Mackie * Primary Care Provider:  McLean-Scocuzza, Nino Glow, MD  CC:  headaches  HPI:  Valerie Colon is a 50 y.o. female here as requested by McLean-Scocuzza, Olivia Mackie * for headaches.   Since the end of April  she ws having this intense pressure in the back of her head, never had anything before, started abruptly and severe, worst headache of life severe and acute and intense pressure, all across the back and radiating down the neck, associated neck pain and tightness. She went to the ED and a migraine and had a CT of the head. Her pcp tried to order an MRI of the brain. For the last month getting pain on the left side of the head, associated with nausea, dizziness, throbbing/pulsating, nausea, no vision changes, worse with movement, happens every day.  If severe gets blurry vision. She had sleep apnea in the past and lost weight. But feels rested and not waking with with headaches, she is on the computer, she wears blu light blocker to help with the light sensitivity. Sumatriptan did not help at all, headaches can get worse positionally, Not exertional, no aura, they are daily. Ondansetron for nausea helps. Uncle has headaches. No medication overuse. Has had hysterectomy. Can last all day. No other focal neurologic deficits, associated symptoms, inciting events or modifiable factors.  Reviewed notes, labs and imaging from outside physicians, which showed:  01/03/2021: TSH normal, BUN 22 and cr 1.14  CT HEAD WITHOUT CONTRAST   TECHNIQUE: Contiguous axial images were obtained from the base of the skull through the vertex without intravenous contrast.   COMPARISON:  None.   FINDINGS: Brain: No evidence of acute infarction, hemorrhage, hydrocephalus, extra-axial collection or mass lesion/mass effect.   Vascular: No hyperdense vessel or unexpected calcification.   Skull: Normal. Negative for fracture  or focal lesion.   Sinuses/Orbits: Visualized paranasal sinuses and mastoid air cells are predominantly clear. Pneumatized petrous apices. Orbits are grossly unremarkable.   Other: None   IMPRESSION: No acute intracranial findings. Personally reviewed and agree.   Review of Systems: Patient complains of symptoms per HPI as well as the following symptoms headaches. Pertinent negatives and positives per HPI. All others negative.   Social History   Socioeconomic History   Marital status: Divorced    Spouse name: Not on file   Number of children: Not on file   Years of education: Not on file   Highest education level: Not on file  Occupational History   Not on file  Tobacco Use   Smoking status: Never   Smokeless tobacco: Never  Substance and Sexual Activity   Alcohol use: No    Alcohol/week: 0.0 standard drinks   Drug use: No   Sexual activity: Not on file  Other Topics Concern   Not on file  Social History Narrative   Works in [employer]    Married to significant other >as of 11/08/19 separated       [employer] [facility]    Wears seatbelt    Has gun at home    Safe in relationship          Caffeine: none   Right handed   Lives by herself   Social Determinants of Health   Financial Resource Strain: Not on file  Food Insecurity: Not on file  Transportation Needs: Not on file  Physical Activity: Not on file  Stress: Not on file  Social Connections: Not on file  Intimate Partner Violence: Not on file    Family History  Problem Relation Age of Onset   Prostate cancer Father    Hyperlipidemia Father    Hypertension Father    Depression Father    Kidney Stones Maternal Grandfather    Stroke Maternal Grandfather    Cancer Maternal Grandfather        prostate cancer    Diabetes Mother    Hyperlipidemia Mother    Hypertension Mother    Osteoporosis Mother        ? if has this    Multiple sclerosis Sister    Diabetes Sister     Heart disease Maternal Grandmother    Cancer Other        breast cancer    Breast cancer Other    Osteoporosis Maternal Aunt    Kidney disease Neg Hx    Bladder Cancer Neg Hx     Past Medical History:  Diagnosis Date   Acid reflux    Allergic rhinitis    Allergy    Anxiety    COVID-19    09/2020   Heart murmur    History of hiatal hernia    Kidney stones    Mass of finger    myxoid neurofibroma multiple   Migraine    Ovarian cyst, left     Patient Active Problem List   Diagnosis Date Noted   Abnormal thyroid function test 01/10/2021   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   ANA positive 05/16/2020   Hiatal hernia 05/16/2020   Benign cyst of right kidney 05/16/2020   Annual physical exam 05/06/2019   Family history of osteoporosis 05/07/2018   Cyst of left ovary 05/07/2018   Cardiac murmur 02/12/2018   Postprandial hypoglycemia 02/12/2018   Benign neoplasm of soft tissues of left upper extremity 11/23/2017   Epigastric abdominal pain 11/13/2017   Hypotension 11/13/2017   Chronic gastritis 10/09/2017   Migraine without aura and without status migrainosus, not intractable 10/09/2017   Soft tissue lesion of elbow region 10/09/2017   Chronic pain of both knees 10/09/2017   Shoulder pain, right 10/09/2017   Pelvic pain 10/09/2017   Breast calcifications 10/09/2017   Increased frequency of headaches 10/02/2017   Snoring 07/31/2017   Sprain of ligament of tarsometatarsal joint 04/01/2016   Nephrolithiasis 02/29/2016   Chronic fatigue 01/24/2016   Dizziness 01/24/2016   Gastroesophageal reflux disease without esophagitis 01/24/2016   GAD (generalized anxiety disorder) 11/20/2015   Emotional stress 11/09/2015   Intermittent palpitations 11/09/2015   Vitamin D deficiency 07/09/2015   Allergic rhinitis 03/29/2015   Chronic anemia 01/08/2015   Prediabetes 01/08/2015   Obstructive apnea 01/20/2014   Hypoglycemia after GI (gastrointestinal) surgery 11/26/2012    Past  Surgical History:  Procedure Laterality Date   ABDOMINAL HYSTERECTOMY     2008; left ovary still intact   BREAST BIOPSY Right 12/19/2013   stereo right- neg   CHOLECYSTECTOMY     COLONOSCOPY WITH PROPOFOL N/A 05/05/2016   Procedure: COLONOSCOPY WITH PROPOFOL;  Surgeon: Lollie Sails, MD;  Location: [facility] ENDOSCOPY;  Service: Endoscopy;  Laterality: N/A;   ESOPHAGOGASTRODUODENOSCOPY (EGD) WITH PROPOFOL N/A 05/05/2016   Procedure: ESOPHAGOGASTRODUODENOSCOPY (EGD) WITH PROPOFOL;  Surgeon: Lollie Sails, MD;  Location: [facility];  Service: Endoscopy;  Laterality: N/A;   EXTRACORPOREAL SHOCK WAVE LITHOTRIPSY Left 11/06/2016   Procedure: EXTRACORPOREAL SHOCK WAVE LITHOTRIPSY (ESWL);  Surgeon: Nickie Retort, MD;  Location: [facility]  ORS;  Service: Urology;  Laterality: Left;   gallbladder  2012   GASTRIC BYPASS  2012   REDUCTION MAMMAPLASTY Bilateral    2010    Current Outpatient Medications  Medication Sig Dispense Refill   butalbital-acetaminophen-caffeine (FIORICET) 50-325-40 MG tablet Take 1 tablet by mouth 2 (two) times daily as needed for headache. 30 tablet 0   CALCIUM PO Take by mouth.     Cholecalciferol (VITAMIN D3 PO) Take by mouth.     ferrous sulfate 325 (65 FE) MG EC tablet Take 325 mg by mouth 3 (three) times daily with meals.     furosemide (LASIX) 20 MG tablet Take 1 tablet (20 mg total) by mouth daily as needed. 30 tablet 0   Magnesium 400 MG CAPS Take by mouth.     Multiple Vitamin (MULTIVITAMIN) tablet Take 1 tablet by mouth daily.     ondansetron (ZOFRAN ODT) 4 MG disintegrating tablet Take 1 tablet (4 mg total) by mouth every 8 (eight) hours as needed for nausea or vomiting. 40 tablet 0   rizatriptan (MAXALT-MLT) 10 MG disintegrating tablet Take 1 tablet (10 mg total) by mouth as needed for migraine. May repeat in 2 hours if needed 9 tablet 11   topiramate (TOPAMAX) 50 MG tablet Take 1 tablet (50 mg total) by mouth at bedtime. 30 tablet 6   No current  facility-administered medications for this visit.    Allergies as of 01/29/2021   (No Known Allergies)    Vitals: BP 127/63   Pulse 75   Ht 5\' 6"  (1.676 m)   Wt 201 lb 9.6 oz (91.4 kg)   LMP  (LMP Unknown) Comment: neg upt  BMI 32.54 kg/m  Last Weight:  Wt Readings from Last 1 Encounters:  01/29/21 201 lb 9.6 oz (91.4 kg)   Last Height:   Ht Readings from Last 1 Encounters:  01/29/21 5\' 6"  (1.676 m)     Physical exam: Exam: Gen: NAD, conversant, well nourised, obese, well groomed                     CV: RRR, no MRG. No Carotid Bruits. No peripheral edema, warm, nontender Eyes: Conjunctivae clear without exudates or hemorrhage  Neuro: Detailed Neurologic Exam  Speech:    Speech is normal; fluent and spontaneous with normal comprehension.  Cognition:    The patient is oriented to person, place, and time;     recent and remote memory intact;     language fluent;     normal attention, concentration,     fund of knowledge Cranial Nerves:    The pupils are equal, round, and reactive to light. The fundi are normal and spontaneous venous pulsations are present. Visual fields are full to finger confrontation. Extraocular movements are intact. Trigeminal sensation is intact and the muscles of mastication are normal. The face is symmetric. The palate elevates in the midline. Hearing intact. Voice is normal. Shoulder shrug is normal. The tongue has normal motion without fasciculations.   Coordination:    Normal finger to nose and heel to shin. Normal rapid alternating movements.   Gait:    Heel-toe and tandem gait are normal.   Motor Observation:    No asymmetry, no atrophy, and no involuntary movements noted. Tone:    Normal muscle tone.    Posture:    Posture is normal. normal erect    Strength:    Strength is V/V in the upper and lower limbs.  Sensation: intact to LT     Reflex Exam:  DTR's:    Deep tendon reflexes in the upper and lower extremities are  normal bilaterally.   Toes:    The toes are downgoing bilaterally.   Clonus:    Clonus is absent.    Assessment/Plan:  Patient with likely migraines however given concerning symptoms she needs a through evaluation.   - MRI brain due to concerning symptoms of worsening headaches, positional headaches,vision changes  to look for space occupying mass, chiari or intracranial hypertension (pseudotumor), stroke or other  - blood work  - Prevention: Start Topiramate for prevention of migraines;Topiramate: Start at 50mg  every night - Acute/Emergency: Rizatriptan Please take one tablet at the onset of your headache. If it does not improve the symptoms please take one additional tablet. Do not take more then 2 tablets in 24hrs. Do not take use more then 2 to 3 times in a week.Can take with ondansetron. Take immediately.   If above doesn't help:  Next try Propranolol, can also consider nortriptyline/amitriptyline for prevention. Other medications include new injections like Ajovy.  Next for acute/emergency could probably try for Ubrelvy since tried sumatriptan and rizatriptan   Orders Placed This Encounter  Procedures   MR BRAIN W WO CONTRAST    Meds ordered this encounter  Medications   rizatriptan (MAXALT-MLT) 10 MG disintegrating tablet    Sig: Take 1 tablet (10 mg total) by mouth as needed for migraine. May repeat in 2 hours if needed    Dispense:  9 tablet    Refill:  11   topiramate (TOPAMAX) 50 MG tablet    Sig: Take 1 tablet (50 mg total) by mouth at bedtime.    Dispense:  30 tablet    Refill:  6    Discussed: To prevent or relieve headaches, try the following: Cool Compress. Lie down and place a cool compress on your head.  Avoid headache triggers. If certain foods or odors seem to have triggered your migraines in the past, avoid them. A headache diary might help you identify triggers.  Include physical activity in your daily routine. Try a daily walk or other moderate aerobic  exercise.  Manage stress. Find healthy ways to cope with the stressors, such as delegating tasks on your to-do list.  Practice relaxation techniques. Try deep breathing, yoga, massage and visualization.  Eat regularly. Eating regularly scheduled meals and maintaining a healthy diet might help prevent headaches. Also, drink plenty of fluids.  Follow a regular sleep schedule. Sleep deprivation might contribute to headaches Consider biofeedback. With this mind-body technique, you learn to control certain bodily functions -- such as muscle tension, heart rate and blood pressure -- to prevent headaches or reduce headache pain.    Proceed to emergency room if you experience new or worsening symptoms or symptoms do not resolve, if you have new neurologic symptoms or if headache is severe, or for any concerning symptom.   Provided education and documentation from American headache Society toolbox including articles on: chronic migraine medication overuse headache, chronic migraines, prevention of migraines, behavioral and other nonpharmacologic treatments for headache.   Cc: McLean-Scocuzza, Olivia Mackie *,  McLean-Scocuzza, Nino Glow, MD  Sarina Ill, MD  [facility] [address] [place], [place] [address]  Phone [phone] Fax [phone]

## 2021-01-30 ENCOUNTER — Telehealth: Payer: Self-pay | Admitting: Neurology

## 2021-01-30 NOTE — Telephone Encounter (Signed)
MRI brain w/wo contrast Dillon Bjork: [id number] (01/30/21-02/28/21)  Sent to GI for scheduling.

## 2021-02-10 ENCOUNTER — Ambulatory Visit
Admission: RE | Admit: 2021-02-10 | Discharge: 2021-02-10 | Disposition: A | Discharge status: Home / Self Care | Payer: BC Managed Care – PPO | Source: Ambulatory Visit | Attending: Neurology | Admitting: Neurology

## 2021-02-10 ENCOUNTER — Other Ambulatory Visit: Payer: Self-pay

## 2021-02-10 DIAGNOSIS — R519 Headache, unspecified: Secondary | ICD-10-CM

## 2021-02-10 DIAGNOSIS — R51 Headache with orthostatic component, not elsewhere classified: Secondary | ICD-10-CM

## 2021-02-10 DIAGNOSIS — R2689 Other abnormalities of gait and mobility: Secondary | ICD-10-CM | POA: Diagnosis not present

## 2021-02-10 DIAGNOSIS — H539 Unspecified visual disturbance: Secondary | ICD-10-CM | POA: Diagnosis not present

## 2021-02-10 MED ORDER — GADOBENATE DIMEGLUMINE 529 MG/ML IV SOLN
18.0000 mL | Freq: Once | INTRAVENOUS | Status: AC | PRN
  Administered 2021-02-10: 18 mL via INTRAVENOUS

## 2021-02-13 ENCOUNTER — Telehealth: Payer: Self-pay | Admitting: *Deleted

## 2021-02-13 NOTE — Telephone Encounter (Signed)
Spoke with patient and advised her MRI brain is unremarkable for age, no concerns or any findings other than what we normally see with age.  Patient was appreciative and her questions were answered.

## 2021-02-13 NOTE — Telephone Encounter (Signed)
-----   Message from Melvenia Beam, MD sent at 02/12/2021  6:13 PM EDT ----- MRI of the brain unremarkable for age, thanks

## 2021-03-19 ENCOUNTER — Telehealth: Payer: Self-pay | Admitting: Neurology

## 2021-03-19 NOTE — Telephone Encounter (Signed)
Great, thanks

## 2021-03-19 NOTE — Telephone Encounter (Signed)
Please see my chart message for reference.  It sounds like patient is willing to continue with the Topamax and monitor her symptoms.  I agree with continuation of the Topamax for now for migraine prevention.

## 2021-03-23 IMAGING — MG DIGITAL SCREENING BILAT W/ TOMO W/ CAD
8 series · 8 of 24 positions shown · non-contrast
Comparison: Previous exam(s).

CLINICAL DATA: Screening.

EXAM:
DIGITAL SCREENING BILATERAL MAMMOGRAM WITH TOMO AND CAD

[R MLO synth-2D]
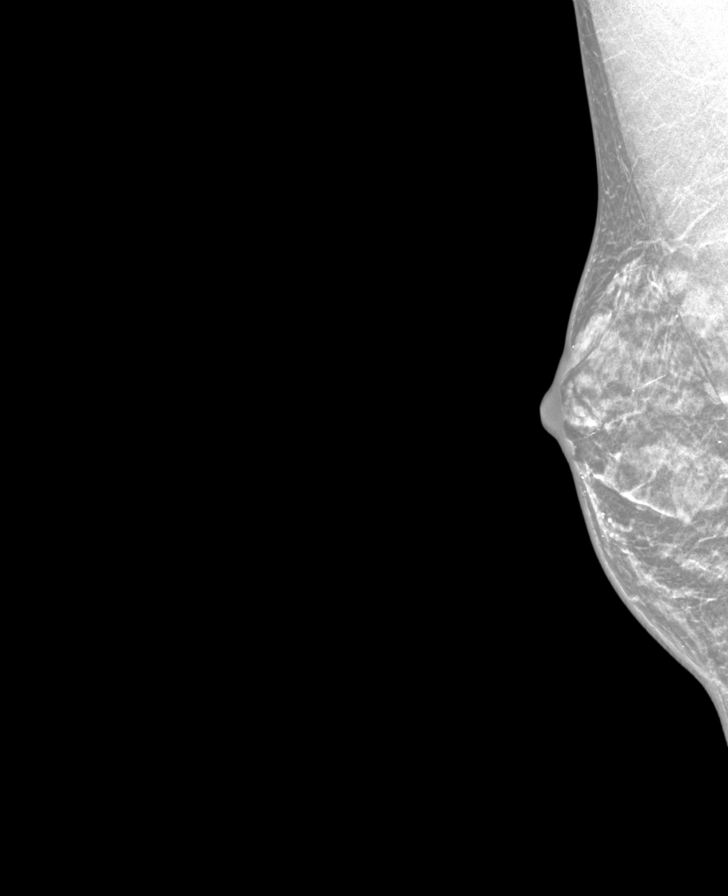

[L CC synth-2D]
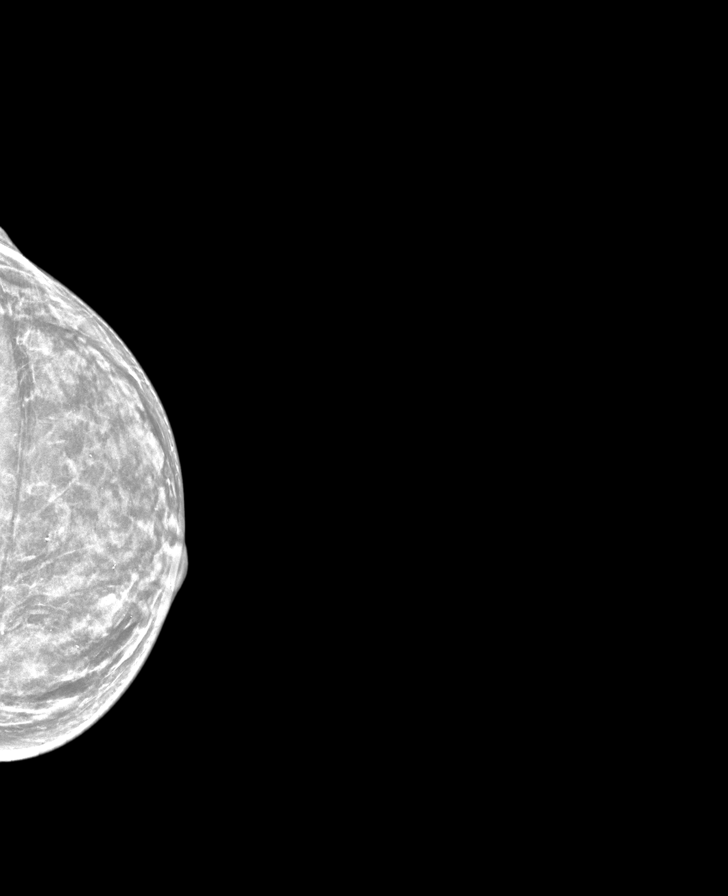

[L MLO synth-2D]
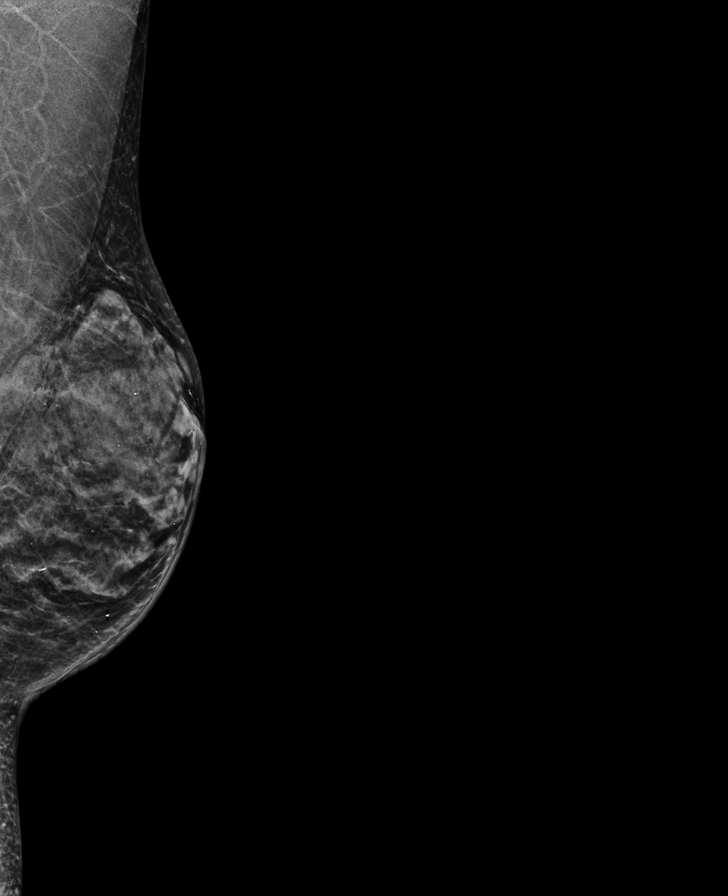

[R CC synth-2D]
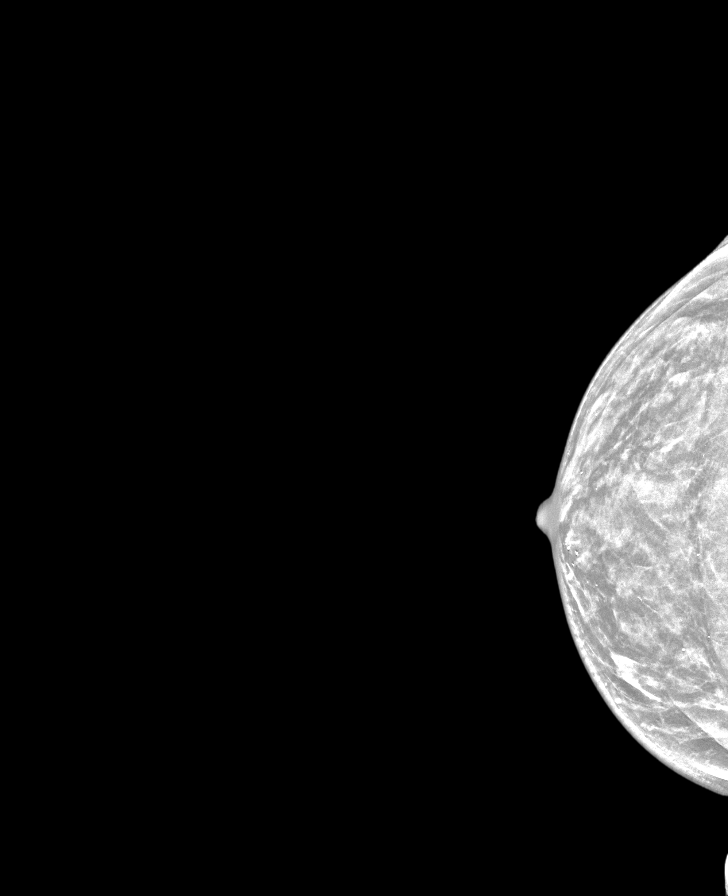

[L CC tomo · tomo slice 25/49.0]
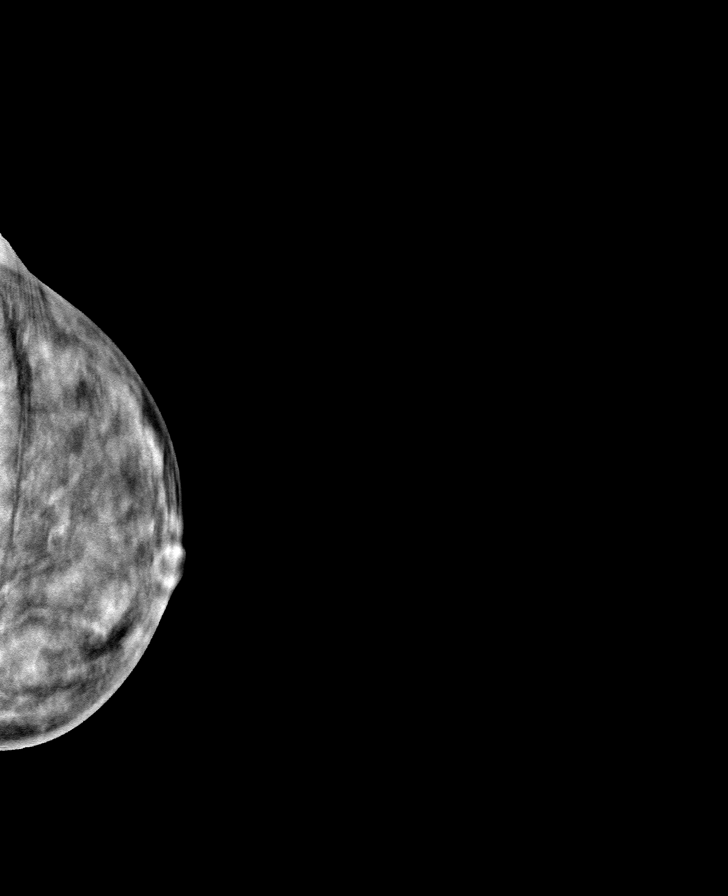

[R CC tomo · tomo slice 23/46.0]
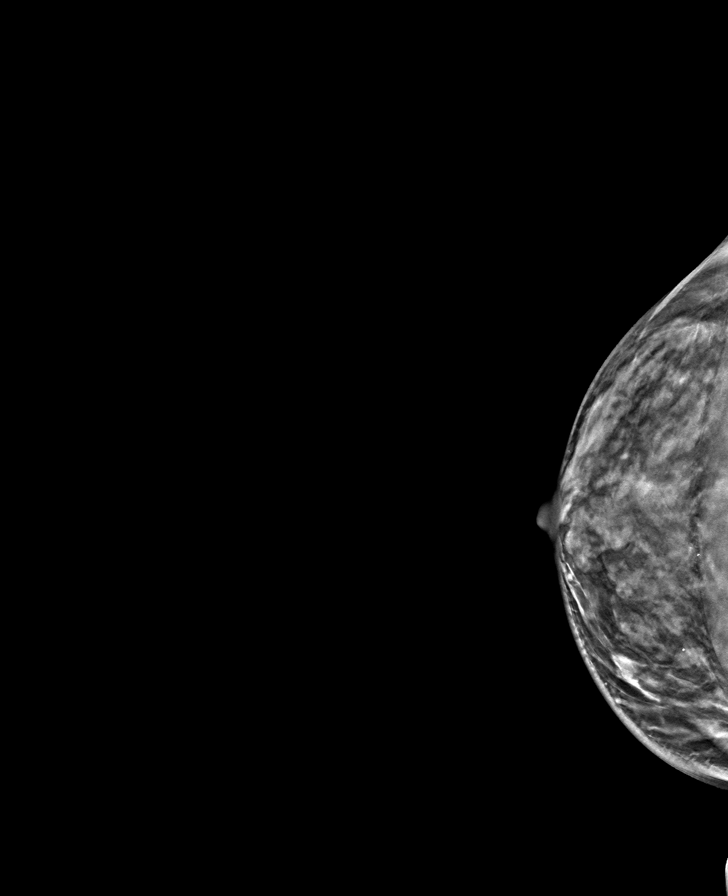

[L MLO tomo · tomo slice 24/47.0]
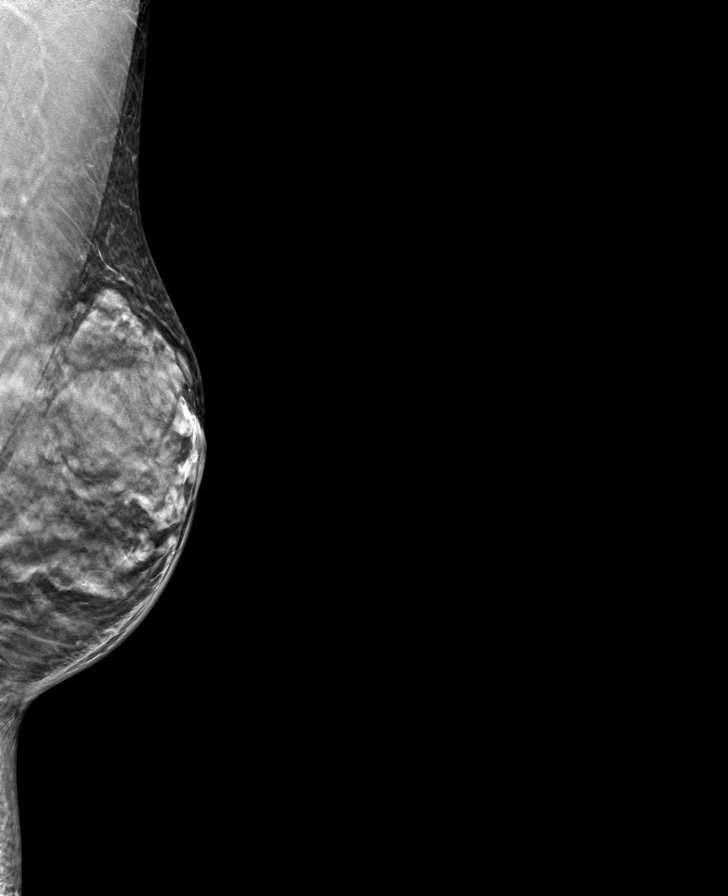

[R MLO tomo · tomo slice 19/37.0]
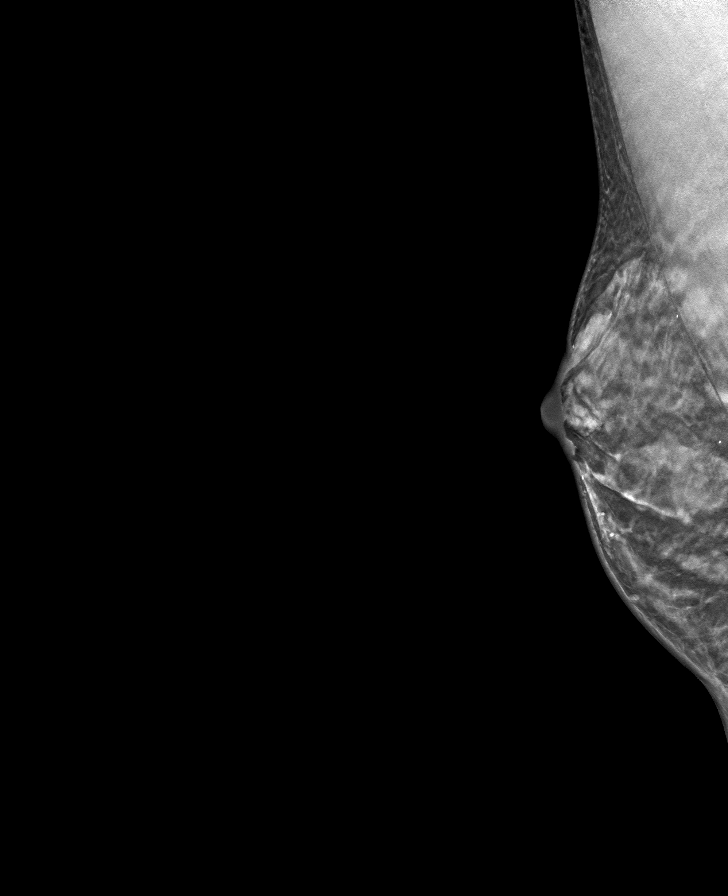

[8 of 24 positions shown; findings below may reference images not displayed]

ACR Breast Density Category d: The breast tissue is extremely dense,
which lowers the sensitivity of mammography
FINDINGS: There are no findings suspicious for malignancy. Images were
processed with CAD.
IMPRESSION: No mammographic evidence of malignancy. A result letter of this
screening mammogram will be mailed directly to the patient.

RECOMMENDATION:
Screening mammogram in one year. (Code:[id number])

BI-RADS CATEGORY  1: Negative.

## 2021-03-24 DIAGNOSIS — G43009 Migraine without aura, not intractable, without status migrainosus: Secondary | ICD-10-CM

## 2021-03-25 NOTE — Telephone Encounter (Signed)
Noted, patient sent mychart message back.

## 2021-03-26 MED ORDER — UBRELVY 100 MG PO TABS: 100.0000 mg | ORAL_TABLET | ORAL | 3 refills | Status: DC | PRN

## 2021-03-26 NOTE — Telephone Encounter (Signed)
Ubrelvy 100 mg Rx #[id number] sent to [facility] pharmacy per v.o Dr Jaynee Eagles. Pt updated.

## 2021-04-03 ENCOUNTER — Telehealth: Payer: Self-pay | Admitting: *Deleted

## 2021-04-03 NOTE — Telephone Encounter (Addendum)
[name] PA< key: [id number], Your information has been sent to [facility]. If [facility] has not replied to your request within 24 hours please contact [facility] at [phone].

## 2021-04-03 NOTE — Telephone Encounter (Signed)
[name] PA Case: [id number], Status: Denied. Will call tomorrow.

## 2021-04-04 ENCOUNTER — Encounter: Payer: Self-pay | Admitting: *Deleted

## 2021-04-04 NOTE — Telephone Encounter (Signed)
Received fax from [facility] not approved. Patient must have trial of or intolerance to Nurtec ODT. Will sent to Dr Jaynee Eagles for next steps.

## 2021-04-04 NOTE — Telephone Encounter (Signed)
Sent patient my chart to try Ubrelvy savings card.

## 2021-04-04 NOTE — Telephone Encounter (Addendum)
Called [facility] PA dept, spoke with Colletta [place] who stated preferred are sumatriptan, rizatriptan, nurtec.  I advised patient has failed triptans and nurtec was denied per note from PCP's office. Transferred to [place], spoke with [facility] who stated they need clinicals stating she failed preferred plus note that her insurance denied nurtec. Fax clinicals to   [phone], attn PA dept. Ref [id number]. 72 hours turn around time. Clinicals faxed, received confirmation.

## 2021-04-05 NOTE — Telephone Encounter (Signed)
Valerie Colon denial letter to [facility] pharmacy and messaged patient to call them to check on Rx.

## 2021-04-26 DIAGNOSIS — R6889 Other general symptoms and signs: Secondary | ICD-10-CM | POA: Diagnosis not present

## 2021-04-26 DIAGNOSIS — E0590 Thyrotoxicosis, unspecified without thyrotoxic crisis or storm: Secondary | ICD-10-CM | POA: Diagnosis not present

## 2021-05-03 DIAGNOSIS — K912 Postsurgical malabsorption, not elsewhere classified: Secondary | ICD-10-CM | POA: Diagnosis not present

## 2021-05-03 DIAGNOSIS — E0590 Thyrotoxicosis, unspecified without thyrotoxic crisis or storm: Secondary | ICD-10-CM | POA: Diagnosis not present

## 2021-05-22 ENCOUNTER — Other Ambulatory Visit: Payer: Self-pay | Admitting: Neurology

## 2021-05-22 ENCOUNTER — Telehealth: Payer: BC Managed Care – PPO | Admitting: Neurology

## 2021-05-22 ENCOUNTER — Telehealth: Payer: Self-pay | Admitting: Neurology

## 2021-05-22 DIAGNOSIS — R110 Nausea: Secondary | ICD-10-CM

## 2021-05-22 MED ORDER — ONDANSETRON 4 MG PO TBDP: 4.0000 mg | ORAL_TABLET | Freq: Three times a day (TID) | ORAL | 6 refills | Status: DC | PRN

## 2021-05-22 NOTE — Telephone Encounter (Signed)
Spoke with pt and advised prescription sent to pharmacy for Zofran. Pt's questions were answered. She was also rescheduled to a sooner availability on Fri 10/14 @ 845 AM for a video visit with Dr Jaynee Eagles. Pt verbalized appreciation for the call.

## 2021-05-22 NOTE — Telephone Encounter (Signed)
I spoke with patient to reschedule her mychart visit due to Dr. Jaynee Eagles being out. We rescheduled for 07/23/21. She is good on refills for now and I told her if she needs more before December to give [place] a call. She said that with her medication the frequency of her migraines have improved but when she does get them they are still very intense and make her nauseous.

## 2021-05-31 ENCOUNTER — Telehealth (INDEPENDENT_AMBULATORY_CARE_PROVIDER_SITE_OTHER): Payer: BC Managed Care – PPO | Admitting: Neurology

## 2021-05-31 DIAGNOSIS — G43009 Migraine without aura, not intractable, without status migrainosus: Secondary | ICD-10-CM

## 2021-05-31 DIAGNOSIS — G43709 Chronic migraine without aura, not intractable, without status migrainosus: Secondary | ICD-10-CM

## 2021-05-31 MED ORDER — AJOVY 225 MG/1.5ML ~~LOC~~ SOAJ: 225.0000 mg | SUBCUTANEOUS | 11 refills | Status: DC

## 2021-05-31 NOTE — Progress Notes (Signed)
[facility]    Provider:  Dr Jaynee Eagles Requesting Provider: McLean-Scocuzza, Olivia Mackie * Primary Care Provider:  McLean-Scocuzza, Nino Glow, MD  CC:  headaches  Virtual Visit via Video Note  I connected with [name] on 06/02/21 at  8:45 AM EDT by a video enabled telemedicine application and verified that I am speaking with the correct person using two identifiers.  Location: Patient: home Provider: office   I discussed the limitations of evaluation and management by telemedicine and the availability of in person appointments. The patient expressed understanding and agreed to proceed.   I discussed the assessment and treatment plan with the patient. The patient was provided an opportunity to ask questions and all were answered. The patient agreed with the plan and demonstrated an understanding of the instructions.   The patient was advised to call back or seek an in-person evaluation if the symptoms worsen or if the condition fails to improve as anticipated.  I provided 20 minutes of non-face-to-face time during this encounter.   Melvenia Beam, MD  06/02/2021: Valerie Colon is helping. Discussed preventative. Side effects to topiramate. Tried verapamil, gabapentin, topiramate(not helping), nortriptyline.  She has episodic migraines, less than 8 migraine days a month, the Valerie Colon is helping, none of the other medications have helped, she has tried multiple classes, Ajovy is approved for episodic migraines we will see if we will get it approved or she can use the co-pay card. Cont ubrelvy.    Meds ordered this encounter  Medications   Fremanezumab-vfrm (AJOVY) 225 MG/1.5ML SOAJ    Sig: Inject 225 mg into the skin every 30 (thirty) days.    Dispense:  1.5 mL    Refill:  11     HPI:  Valerie Colon is a 50 y.o. female here as requested by McLean-Scocuzza, Olivia Mackie * for headaches.   Since the end of April  she ws having this intense pressure in the back of her  head, never had anything before, started abruptly and severe, worst headache of life severe and acute and intense pressure, all across the back and radiating down the neck, associated neck pain and tightness. She went to the ED and a migraine and had a CT of the head. Her pcp tried to order an MRI of the brain. For the last month getting pain on the left side of the head, associated with nausea, dizziness, throbbing/pulsating, nausea, no vision changes, worse with movement, happens every day.  If severe gets blurry vision. She had sleep apnea in the past and lost weight. But feels rested and not waking with with headaches, she is on the computer, she wears blu light blocker to help with the light sensitivity. Sumatriptan did not help at all, headaches can get worse positionally, Not exertional, no aura, they are daily. Ondansetron for nausea helps. Uncle has headaches. No medication overuse. Has had hysterectomy. Can last all day. No other focal neurologic deficits, associated symptoms, inciting events or modifiable factors.  Reviewed notes, labs and imaging from outside physicians, which showed:  01/03/2021: TSH normal, BUN 22 and cr 1.14  CT HEAD WITHOUT CONTRAST   TECHNIQUE: Contiguous axial images were obtained from the base of the skull through the vertex without intravenous contrast.   COMPARISON:  None.   FINDINGS: Brain: No evidence of acute infarction, hemorrhage, hydrocephalus, extra-axial collection or mass lesion/mass effect.   Vascular: No hyperdense vessel or unexpected calcification.   Skull: Normal. Negative for fracture or focal lesion.   Sinuses/Orbits: Visualized  paranasal sinuses and mastoid air cells are predominantly clear. Pneumatized petrous apices. Orbits are grossly unremarkable.   Other: None   IMPRESSION: No acute intracranial findings. Personally reviewed and agree.   Review of Systems: Patient complains of symptoms per HPI as well as the following symptoms  headaches all others neg. Pertinent negatives and positives per HPI. All others negative.   Social History   Socioeconomic History   Marital status: Divorced    Spouse name: Not on file   Number of children: Not on file   Years of education: Not on file   Highest education level: Not on file  Occupational History   Not on file  Tobacco Use   Smoking status: Never   Smokeless tobacco: Never  Substance and Sexual Activity   Alcohol use: No    Alcohol/week: 0.0 standard drinks   Drug use: No   Sexual activity: Not on file  Other Topics Concern   Not on file  Social History Narrative   Works in [employer]    Married to significant other >as of 11/08/19 separated       [employer]    Wears seatbelt    Has gun at home    Safe in relationship          Caffeine: none   Right handed   Lives by herself   Social Determinants of Health   Financial Resource Strain: Not on file  Food Insecurity: Not on file  Transportation Needs: Not on file  Physical Activity: Not on file  Stress: Not on file  Social Connections: Not on file  Intimate Partner Violence: Not on file    Family History  Problem Relation Age of Onset   Prostate cancer Father    Hyperlipidemia Father    Hypertension Father    Depression Father    Kidney Stones Maternal Grandfather    Stroke Maternal Grandfather    Cancer Maternal Grandfather        prostate cancer    Diabetes Mother    Hyperlipidemia Mother    Hypertension Mother    Osteoporosis Mother        ? if has this    Multiple sclerosis Sister    Diabetes Sister    Heart disease Maternal Grandmother    Cancer Other        breast cancer    Breast cancer Other    Osteoporosis Maternal Aunt    Kidney disease Neg Hx    Bladder Cancer Neg Hx     Past Medical History:  Diagnosis Date   Acid reflux    Allergic rhinitis    Allergy    Anxiety    COVID-19    09/2020   Heart murmur    History of hiatal  hernia    Kidney stones    Mass of finger    myxoid neurofibroma multiple   Migraine    Ovarian cyst, left     Patient Active Problem List   Diagnosis Date Noted   Abnormal thyroid function test 01/10/2021   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   ANA positive 05/16/2020   Hiatal hernia 05/16/2020   Benign cyst of right kidney 05/16/2020   Annual physical exam 05/06/2019   Family history of osteoporosis 05/07/2018   Cyst of left ovary 05/07/2018   Cardiac murmur 02/12/2018   Postprandial hypoglycemia 02/12/2018   Benign neoplasm of soft tissues of left upper extremity 11/23/2017  Epigastric abdominal pain 11/13/2017   Hypotension 11/13/2017   Chronic gastritis 10/09/2017   Migraine without aura and without status migrainosus, not intractable 10/09/2017   Soft tissue lesion of elbow region 10/09/2017   Chronic pain of both knees 10/09/2017   Shoulder pain, right 10/09/2017   Pelvic pain 10/09/2017   Breast calcifications 10/09/2017   Increased frequency of headaches 10/02/2017   Snoring 07/31/2017   Sprain of ligament of tarsometatarsal joint 04/01/2016   Nephrolithiasis 02/29/2016   Chronic fatigue 01/24/2016   Dizziness 01/24/2016   Gastroesophageal reflux disease without esophagitis 01/24/2016   GAD (generalized anxiety disorder) 11/20/2015   Emotional stress 11/09/2015   Intermittent palpitations 11/09/2015   Vitamin D deficiency 07/09/2015   Allergic rhinitis 03/29/2015   Chronic anemia 01/08/2015   Prediabetes 01/08/2015   Obstructive apnea 01/20/2014   Hypoglycemia after GI (gastrointestinal) surgery 11/26/2012    Past Surgical History:  Procedure Laterality Date   ABDOMINAL HYSTERECTOMY     2008; left ovary still intact   BREAST BIOPSY Right 12/19/2013   stereo right- neg   CHOLECYSTECTOMY     COLONOSCOPY WITH PROPOFOL N/A 05/05/2016   Procedure: COLONOSCOPY WITH PROPOFOL;  Surgeon: Lollie Sails, MD;  Location[facility] ENDOSCOPY;  Service: Endoscopy;   Laterality: N/A;   ESOPHAGOGASTRODUODENOSCOPY (EGD) WITH PROPOFOL N/A 05/05/2016   Procedure: ESOPHAGOGASTRODUODENOSCOPY (EGD) WITH PROPOFOL;  Surgeon: Lollie Sails, MD;  Location: [facility] ENDOSCOPY;  Service: Endoscopy;  Laterality: N/A;   EXTRACORPOREAL SHOCK WAVE LITHOTRIPSY Left 11/06/2016   Procedure: EXTRACORPOREAL SHOCK WAVE LITHOTRIPSY (ESWL);  Surgeon: Nickie Retort, MD;  Location: [facility] ORS;  Service: Urology;  Laterality: Left;   gallbladder  2012   GASTRIC BYPASS  2012   REDUCTION MAMMAPLASTY Bilateral    2010    Current Outpatient Medications  Medication Sig Dispense Refill   Fremanezumab-vfrm (AJOVY) 225 MG/1.5ML SOAJ Inject 225 mg into the skin every 30 (thirty) days. 1.5 mL 11   butalbital-acetaminophen-caffeine (FIORICET) 50-325-40 MG tablet Take 1 tablet by mouth 2 (two) times daily as needed for headache. 30 tablet 0   CALCIUM PO Take by mouth.     Cholecalciferol (VITAMIN D3 PO) Take by mouth.     ferrous sulfate 325 (65 FE) MG EC tablet Take 325 mg by mouth 3 (three) times daily with meals.     furosemide (LASIX) 20 MG tablet Take 1 tablet (20 mg total) by mouth daily as needed. 30 tablet 0   Magnesium 400 MG CAPS Take by mouth.     Multiple Vitamin (MULTIVITAMIN) tablet Take 1 tablet by mouth daily.     ondansetron (ZOFRAN ODT) 4 MG disintegrating tablet Take 1 tablet (4 mg total) by mouth every 8 (eight) hours as needed for nausea or vomiting. 30 tablet 6   rizatriptan (MAXALT-MLT) 10 MG disintegrating tablet Take 1 tablet (10 mg total) by mouth as needed for migraine. May repeat in 2 hours if needed 9 tablet 11   topiramate (TOPAMAX) 50 MG tablet Take 1 tablet (50 mg total) by mouth at bedtime. 30 tablet 6   Ubrogepant (UBRELVY) 100 MG TABS Take 100 mg by mouth every 2 (two) hours as needed (migraine.). Take as close to onset of migraine as possible. DO NOT EXCEED 200 MG IN 24 HOURS. 16 tablet 3   No current facility-administered medications for this visit.     Allergies as of 05/31/2021   (No Known Allergies)    Vitals: LMP  (LMP Unknown) Comment: neg upt Last Weight:  Wt  Readings from Last 1 Encounters:  01/29/21 201 lb 9.6 oz (91.4 kg)   Last Height:   Ht Readings from Last 1 Encounters:  01/29/21 5\' 6"  (1.676 m)     Physical exam: Exam: Gen: NAD, conversant      CV: attempted, Could not perform over Web Video. Denies palpitations or chest pain or SOB. VS: Breathing at a normal rate. Weight appears within normal limits. Not febrile. Eyes: Conjunctivae clear without exudates or hemorrhage  Neuro: Detailed Neurologic Exam  Speech:    Speech is normal; fluent and spontaneous with normal comprehension.  Cognition:    The patient is oriented to person, place, and time;     recent and remote memory intact;     language fluent;     normal attention, concentration,     fund of knowledge Cranial Nerves:    The pupils are equal, round, and reactive to light. Attempted, Cannot perform fundoscopic exam. Visual fields are full to finger confrontation. Extraocular movements are intact.  The face is symmetric with normal sensation. The palate elevates in the midline. Hearing intact. Voice is normal. Shoulder shrug is normal. The tongue has normal motion without fasciculations.   Coordination:    Normal finger to nose  Gait:    Normal native gait  Motor Observation:   no involuntary movements noted. Tone:    Appears normal  Posture:    Posture is normal. normal erect    Strength:    Strength is anti-gravity and symmetric in the upper and lower limbs.      Sensation: intact to LT     Reflex Exam:  DTR's:    Attempted, Could not perform over Web Video   Toes: Attempted Could not perform over Web Video  Clonus:   Attempted, Could not perform over Web Video       Assessment/Plan:  Patient with migraines. < 8 migraine days a month  - MRI brain 02/06/2021 unremarkable.   - Acute/Emergency: Valerie Colon helps. Failed  sumatriptan, rizatriptan Please take one tablet at the onset of your headache. If it does not improve the symptoms please take one additional tablet.   - after further discussion she has tried nortriptyline in the past, verapamil, gabapentin, topamax did not tolerate or help. Try Ajovy, is approved for episodic migraines will see if can get approved or she can use the copay card.   Meds ordered this encounter  Medications   Fremanezumab-vfrm (AJOVY) 225 MG/1.5ML SOAJ    Sig: Inject 225 mg into the skin every 30 (thirty) days.    Dispense:  1.5 mL    Refill:  11     Discussed: To prevent or relieve headaches, try the following: Cool Compress. Lie down and place a cool compress on your head.  Avoid headache triggers. If certain foods or odors seem to have triggered your migraines in the past, avoid them. A headache diary might help you identify triggers.  Include physical activity in your daily routine. Try a daily walk or other moderate aerobic exercise.  Manage stress. Find healthy ways to cope with the stressors, such as delegating tasks on your to-do list.  Practice relaxation techniques. Try deep breathing, yoga, massage and visualization.  Eat regularly. Eating regularly scheduled meals and maintaining a healthy diet might help prevent headaches. Also, drink plenty of fluids.  Follow a regular sleep schedule. Sleep deprivation might contribute to headaches Consider biofeedback. With this mind-body technique, you learn to control certain bodily functions -- such as muscle  tension, heart rate and blood pressure -- to prevent headaches or reduce headache pain.    Proceed to emergency room if you experience new or worsening symptoms or symptoms do not resolve, if you have new neurologic symptoms or if headache is severe, or for any concerning symptom.   Provided education and documentation from American headache Society toolbox including articles on: chronic migraine medication overuse  headache, chronic migraines, prevention of migraines, behavioral and other nonpharmacologic treatments for headache.   Cc: McLean-Scocuzza, Olivia Mackie *,  McLean-Scocuzza, Nino Glow, MD  Sarina Ill, MD  [facility] [address] [place], [place] [address]  Phone [phone] Fax [phone]

## 2021-06-02 ENCOUNTER — Encounter: Payer: Self-pay | Admitting: Neurology

## 2021-06-02 NOTE — Patient Instructions (Signed)
Continue Valerie Colon

## 2021-06-03 ENCOUNTER — Telehealth: Payer: BC Managed Care – PPO | Admitting: Neurology

## 2021-06-04 ENCOUNTER — Other Ambulatory Visit: Payer: Self-pay | Admitting: *Deleted

## 2021-06-04 ENCOUNTER — Telehealth: Payer: Self-pay | Admitting: *Deleted

## 2021-06-04 DIAGNOSIS — G43911 Migraine, unspecified, intractable, with status migrainosus: Secondary | ICD-10-CM

## 2021-06-04 MED ORDER — EMGALITY 120 MG/ML ~~LOC~~ SOAJ: 120.0000 mg | SUBCUTANEOUS | 5 refills | Status: DC

## 2021-06-04 NOTE — Progress Notes (Signed)
Valerie Colon is not covered by patients insurance . Per Dr.Ahern Change to [employer]

## 2021-06-04 NOTE — Telephone Encounter (Signed)
Valerie Colon Key: [id number]  Sent PA for [facility] waiting for approval

## 2021-06-05 NOTE — Telephone Encounter (Deleted)
Approvedtoday PA Case: [id number], Status: Approved, Coverage Starts on: 06/05/2021 12:00:00 AM, Coverage Ends on: 09/03/2021 12:00:00 AM.   PA has been approved for [employer] . Will contact patient through [place] to make her aware of approval

## 2021-06-05 NOTE — Telephone Encounter (Signed)
PA for Emgality has been approved .  Reference # [id number] Approval Dates 06/05/2021-09/03/2021  Will contact patient through [place] to make her aware of approval

## 2021-06-05 NOTE — Telephone Encounter (Signed)
Claudine Mouton Key: [id number] - PA Case ID: [id number]  Had to resend PA for Emgality  this AM waiting on approval

## 2021-06-11 ENCOUNTER — Ambulatory Visit: Payer: BC Managed Care – PPO | Admitting: Internal Medicine

## 2021-06-11 ENCOUNTER — Other Ambulatory Visit: Payer: Self-pay

## 2021-06-11 ENCOUNTER — Encounter: Payer: Self-pay | Admitting: Internal Medicine

## 2021-06-11 VITALS — BP 114/76 | HR 76 | Temp 97.5°F | Ht 66.0 in | Wt 199.8 lb

## 2021-06-11 DIAGNOSIS — R946 Abnormal results of thyroid function studies: Secondary | ICD-10-CM

## 2021-06-11 DIAGNOSIS — R5383 Other fatigue: Secondary | ICD-10-CM

## 2021-06-11 DIAGNOSIS — Z0000 Encounter for general adult medical examination without abnormal findings: Secondary | ICD-10-CM | POA: Diagnosis not present

## 2021-06-11 DIAGNOSIS — E559 Vitamin D deficiency, unspecified: Secondary | ICD-10-CM

## 2021-06-11 DIAGNOSIS — R1032 Left lower quadrant pain: Secondary | ICD-10-CM | POA: Diagnosis not present

## 2021-06-11 DIAGNOSIS — Z13220 Encounter for screening for lipoid disorders: Secondary | ICD-10-CM

## 2021-06-11 DIAGNOSIS — N83202 Unspecified ovarian cyst, left side: Secondary | ICD-10-CM | POA: Diagnosis not present

## 2021-06-11 DIAGNOSIS — H9193 Unspecified hearing loss, bilateral: Secondary | ICD-10-CM

## 2021-06-11 DIAGNOSIS — R102 Pelvic and perineal pain: Secondary | ICD-10-CM

## 2021-06-11 DIAGNOSIS — N281 Cyst of kidney, acquired: Secondary | ICD-10-CM

## 2021-06-11 DIAGNOSIS — E611 Iron deficiency: Secondary | ICD-10-CM

## 2021-06-11 DIAGNOSIS — R232 Flushing: Secondary | ICD-10-CM

## 2021-06-11 DIAGNOSIS — Z87442 Personal history of urinary calculi: Secondary | ICD-10-CM

## 2021-06-11 NOTE — Progress Notes (Signed)
Chief Complaint  Patient presents with   Annual Exam   Annual 1. Migraines freq less on emgality stopped maxalt, topamax, fiocet and imitrex tried in the past  2. Hypoglycemia and h/o variable tfts f/u endocrine [facility] 10/2021 tsh 0.598 04/26/21  3. Gi f/u 06/25/21 [facility]  4. Recurrent left ovarian cyst s/p hysterectomy with h/o kidney stones have lower left abdominal pain intermittently worse w/in 2 weeks and discomfort no f/u currently with ob/gyn  5. Hearing loss will refer to ENT to check last check 6 years ago    Review of Systems  Constitutional:  Negative for weight loss.  HENT:  Positive for hearing loss.   Eyes:  Negative for blurred vision.  Respiratory:  Negative for shortness of breath.   Cardiovascular:  Negative for chest pain.  Gastrointestinal:  Positive for abdominal pain.  Musculoskeletal:  Negative for falls.  Skin:  Negative for rash.  Neurological:  Negative for headaches.  Psychiatric/Behavioral:  Negative for depression.   Past Medical History:  Diagnosis Date   Acid reflux    Allergic rhinitis    Allergy    Anxiety    COVID-19    09/2020   Heart murmur    History of hiatal hernia    Kidney stones    Mass of finger    myxoid neurofibroma multiple   Migraine    Ovarian cyst, left    Past Surgical History:  Procedure Laterality Date   ABDOMINAL HYSTERECTOMY     2008; left ovary still intact   BREAST BIOPSY Right 12/19/2013   stereo right- neg   CHOLECYSTECTOMY     COLONOSCOPY WITH PROPOFOL N/A 05/05/2016   Procedure: COLONOSCOPY WITH PROPOFOL;  Surgeon: Lollie Sails, MD;  Location: [facility] ENDOSCOPY;  Service: Endoscopy;  Laterality: N/A;   ESOPHAGOGASTRODUODENOSCOPY (EGD) WITH PROPOFOL N/A 05/05/2016   Procedure: ESOPHAGOGASTRODUODENOSCOPY (EGD) WITH PROPOFOL;  Surgeon: Lollie Sails, MD;  Location: [facility] ENDOSCOPY;  Service: Endoscopy;  Laterality: N/A;   EXTRACORPOREAL SHOCK WAVE LITHOTRIPSY Left 11/06/2016   Procedure: EXTRACORPOREAL SHOCK WAVE  LITHOTRIPSY (ESWL);  Surgeon: Nickie Retort, MD;  Location: [facility] ORS;  Service: Urology;  Laterality: Left;   gallbladder  2012   GASTRIC BYPASS  2012   REDUCTION MAMMAPLASTY Bilateral    2010   Family History  Problem Relation Age of Onset   Prostate cancer Father    Hyperlipidemia Father    Hypertension Father    Depression Father    Kidney Stones Maternal Grandfather    Stroke Maternal Grandfather    Cancer Maternal Grandfather        prostate cancer    Diabetes Mother    Hyperlipidemia Mother    Hypertension Mother    Osteoporosis Mother        ? if has this    Multiple sclerosis Sister    Diabetes Sister    Heart disease Maternal Grandmother    Cancer Other        breast cancer    Breast cancer Other    Osteoporosis Maternal Aunt    Kidney disease Neg Hx    Bladder Cancer Neg Hx    Social History   Socioeconomic History   Marital status: Divorced    Spouse name: Not on file   Number of children: Not on file   Years of education: Not on file   Highest education level: Not on file  Occupational History   Not on file  Tobacco Use   Smoking status: Never  Smokeless tobacco: Never  Substance and Sexual Activity   Alcohol use: No    Alcohol/week: 0.0 standard drinks   Drug use: No   Sexual activity: Not on file  Other Topics Concern   Not on file  Social History Narrative   Works in [employer]    Married to significant other >as of 11/08/19 separated       [employer]    Wears seatbelt    Has gun at home    Safe in relationship          Caffeine: none   Right handed   Lives by herself   Social Determinants of Health   Financial Resource Strain: Not on file  Food Insecurity: Not on file  Transportation Needs: Not on file  Physical Activity: Not on file  Stress: Not on file  Social Connections: Not on file  Intimate Partner Violence: Not on file   Current Meds  Medication Sig   CALCIUM PO Take by  mouth.   Cholecalciferol (VITAMIN D3 PO) Take by mouth.   ferrous sulfate 325 (65 FE) MG EC tablet Take 325 mg by mouth 3 (three) times daily with meals.   Galcanezumab-gnlm (EMGALITY) 120 MG/ML SOAJ Inject 120 mg into the skin every 30 (thirty) days.   Magnesium 400 MG CAPS Take by mouth.   Multiple Vitamin (MULTIVITAMIN) tablet Take 1 tablet by mouth daily.   ondansetron (ZOFRAN ODT) 4 MG disintegrating tablet Take 1 tablet (4 mg total) by mouth every 8 (eight) hours as needed for nausea or vomiting.   Ubrogepant (UBRELVY) 100 MG TABS Take 100 mg by mouth every 2 (two) hours as needed (migraine.). Take as close to onset of migraine as possible. DO NOT EXCEED 200 MG IN 24 HOURS.   No Known Allergies No results found for this or any previous visit (from the past 2160 hour(s)). Objective  Body mass index is 32.25 kg/m. Wt Readings from Last 3 Encounters:  06/11/21 199 lb 12.8 oz (90.6 kg)  01/29/21 201 lb 9.6 oz (91.4 kg)  01/10/21 200 lb (90.7 kg)   Temp Readings from Last 3 Encounters:  06/11/21 (!) 97.5 F (36.4 C) (Oral)  12/29/20 97.8 F (36.6 C) (Oral)  11/09/20 97.9 F (36.6 C) (Oral)   BP Readings from Last 3 Encounters:  06/11/21 114/76  01/29/21 127/63  12/30/20 139/79   Pulse Readings from Last 3 Encounters:  06/11/21 76  01/29/21 75  12/30/20 72    Physical Exam Vitals and nursing note reviewed.  Constitutional:      Appearance: Normal appearance. She is well-developed and well-groomed.  HENT:     Head: Normocephalic and atraumatic.  Eyes:     Conjunctiva/sclera: Conjunctivae normal.     Pupils: Pupils are equal, round, and reactive to light.  Cardiovascular:     Rate and Rhythm: Normal rate and regular rhythm.     Heart sounds: Normal heart sounds.  Pulmonary:     Effort: Pulmonary effort is normal.     Breath sounds: Normal breath sounds.  Abdominal:     Tenderness: There is abdominal tenderness in the left upper quadrant and left lower quadrant.   Skin:    General: Skin is warm and dry.  Neurological:     General: No focal deficit present.     Mental Status: She is alert and oriented to person, place, and time. Mental status is at baseline.     Gait:  Gait normal.  Psychiatric:        Attention and Perception: Attention and perception normal.        Mood and Affect: Mood and affect normal.        Speech: Speech normal.        Behavior: Behavior normal. Behavior is cooperative.        Thought Content: Thought content normal.        Cognition and Memory: Cognition and memory normal.        Judgment: Judgment normal.    Assessment  Plan  Annual physical exam Flu shot given utd covid vx moderna 5/5 Tdap had 01/24/16 and 01/16/18   Hep B immune. [facility]  Consider check HIV/hep C/MMR in future   S/p hysterectomy only left +hemorrhagic cyst with right ovary f/u OB/GYN [facility] pap had 08/11/2019 [facility] normal  with hot flashes disc amberen vs estrovan otc   mammo 05/20/2019 negative sch 06/15/20  Mammo with DEXA sch 2022    Colonoscopy had 04/2016 [facility] repeat rec in 5 years [facility] f/u sch 06/25/21    Consider DEXA FH osteoporosis check with insurance to see if covered      Continue healthy diet and exercise   Female pelvic pain with pain LLQ left sided h/o kidney stones h/o recurrent left ovarian cyst s/p hysterectomy h/o kidney cysts  - Plan: CT ABDOMEN PELVIS WO CONTRAST, Comprehensive metabolic panel, CBC with Differential/Platelet Will need to f/u ob/gyn  Appt gi 06/25/21   Iron deficiency - Plan: Iron, TIBC and Ferritin Panel, CBC with Differential/Platelet  Other fatigue - Plan: Comprehensive metabolic panel, CBC with Differential/Platelet, Cortisol, TSH, T4, free  Abnormal thyroid function test - Plan: TSH, T4, free F/u [facility] endocrine 10/2021   Bilateral hearing loss, unspecified hearing loss type - Plan: Ambulatory referral to ENT Dr. Tami Ribas   Provider: Dr. Olivia Mackie McLean-Scocuzza-Internal  Medicine

## 2021-06-11 NOTE — Patient Instructions (Addendum)
Valerie Colon or amberen   Menopause Menopause is the normal time of a woman's life when menstrual periods stop completely. It marks the natural end to a woman's ability to become pregnant. It can be defined as the absence of a menstrual period for 12 months without another medical cause. The transition to menopause (perimenopause) most often happens between the ages of 46 and 82, and can last for many years. During perimenopause, hormone levels change in your body, which can cause symptoms and affect your health. Menopause may increase your risk for: Weakened bones (osteoporosis), which causes fractures. Depression. Hardening and narrowing of the arteries (atherosclerosis), which can cause heart attacks and strokes. What are the causes? This condition is usually caused by a natural change in hormone levels that happens as you get older. The condition may also be caused by changes that are not natural, including: Surgery to remove both ovaries (surgical menopause). Side effects from some medicines, such as chemotherapy used to treat cancer (chemical menopause). What increases the risk? This condition is more likely to start at an earlier age if you have certain medical conditions or have undergone treatments, including: A tumor of the pituitary gland in the brain. A disease that affects the ovaries and hormones. Certain cancer treatments, such as chemotherapy or hormone therapy, or radiation therapy on the pelvis. Heavy smoking and excessive alcohol use. Family history of early menopause. This condition is also more likely to develop earlier in women who are very thin. What are the signs or symptoms? Symptoms of this condition include: Hot flashes. Irregular menstrual periods. Night sweats. Changes in feelings about sex. This could be a decrease in sex drive or an increased discomfort around your sexuality. Vaginal dryness and thinning of the vaginal walls. This may cause painful sex. Dryness of  the skin and development of wrinkles. Headaches. Problems sleeping (insomnia). Mood swings or irritability. Memory problems. Weight gain. Hair growth on the face and chest. Bladder infections or problems with urinating. How is this diagnosed? This condition is diagnosed based on your medical history, a physical exam, your age, your menstrual history, and your symptoms. Hormone tests may also be done. How is this treated? In some cases, no treatment is needed. You and your health care provider should make a decision together about whether treatment is necessary. Treatment will be based on your individual condition and preferences. Treatment for this condition focuses on managing symptoms. Treatment may include: Menopausal hormone therapy (MHT). Medicines to treat specific symptoms or complications. Acupuncture. Vitamin or herbal supplements. Before starting treatment, make sure to let your health care provider know if you have a personal or family history of these conditions: Heart disease. Breast cancer. Blood clots. Diabetes. Osteoporosis. Follow these instructions at home: Lifestyle Do not use any products that contain nicotine or tobacco, such as cigarettes, e-cigarettes, and chewing tobacco. If you need help quitting, ask your health care provider. Get at least 30 minutes of physical activity on 5 or more days each week. Avoid alcoholic and caffeinated beverages, as well as spicy foods. This may help prevent hot flashes. Get 7-8 hours of sleep each night. If you have hot flashes, try: Dressing in layers. Avoiding things that may trigger hot flashes, such as spicy food, warm places, or stress. Taking slow, deep breaths when a hot flash starts. Keeping a fan in your home and office. Find ways to manage stress, such as deep breathing, meditation, or journaling. Consider going to group therapy with other women who are having menopause  symptoms. Ask your health care provider about  recommended group therapy meetings. Eating and drinking  Eat a healthy, balanced diet that contains whole grains, lean protein, low-fat dairy, and plenty of fruits and vegetables. Your health care provider may recommend adding more soy to your diet. Foods that contain soy include tofu, tempeh, and soy milk. Eat plenty of foods that contain calcium and vitamin D for bone health. Items that are rich in calcium include low-fat milk, yogurt, beans, almonds, sardines, broccoli, and kale. Medicines Take over-the-counter and prescription medicines only as told by your health care provider. Talk with your health care provider before starting any herbal supplements. If prescribed, take vitamins and supplements as told by your health care provider. General instructions  Keep track of your menstrual periods, including: When they occur. How heavy they are and how long they last. How much time passes between periods. Keep track of your symptoms, noting when they start, how often you have them, and how long they last. Use vaginal lubricants or moisturizers to help with vaginal dryness and improve comfort during sex. Keep all follow-up visits. This is important. This includes any group therapy or counseling. Contact a health care provider if: You are still having menstrual periods after age 60. You have pain during sex. You have not had a period for 12 months and you develop vaginal bleeding. Get help right away if you have: Severe depression. Excessive vaginal bleeding. Pain when you urinate. A fast or irregular heartbeat (palpitations). Severe headaches. Abdominal pain or severe indigestion. Summary Menopause is a normal time of life when menstrual periods stop completely. It is usually defined as the absence of a menstrual period for 12 months without another medical cause. The transition to menopause (perimenopause) most often happens between the ages of 53 and 79 and can last for several  years. Symptoms can be managed through medicines, lifestyle changes, and complementary therapies such as acupuncture. Eat a balanced diet that is rich in nutrients to promote bone health and heart health and to manage symptoms during menopause. This information is not intended to replace advice given to you by your health care provider. Make sure you discuss any questions you have with your health care provider. Document Revised: 05/04/2020 Document Reviewed: 01/19/2020 Elsevier Patient Education  [place].

## 2021-06-17 ENCOUNTER — Ambulatory Visit
Admission: RE | Admit: 2021-06-17 | Discharge: 2021-06-17 | Disposition: A | Discharge status: Home / Self Care | Payer: BC Managed Care – PPO | Source: Ambulatory Visit | Attending: Internal Medicine | Admitting: Internal Medicine

## 2021-06-17 ENCOUNTER — Other Ambulatory Visit: Payer: Self-pay

## 2021-06-17 DIAGNOSIS — E2839 Other primary ovarian failure: Secondary | ICD-10-CM | POA: Diagnosis not present

## 2021-06-17 DIAGNOSIS — Z1231 Encounter for screening mammogram for malignant neoplasm of breast: Secondary | ICD-10-CM | POA: Insufficient documentation

## 2021-06-17 DIAGNOSIS — Z780 Asymptomatic menopausal state: Secondary | ICD-10-CM | POA: Diagnosis not present

## 2021-06-18 ENCOUNTER — Other Ambulatory Visit: Payer: Self-pay | Admitting: Internal Medicine

## 2021-06-18 DIAGNOSIS — R928 Other abnormal and inconclusive findings on diagnostic imaging of breast: Secondary | ICD-10-CM

## 2021-06-18 DIAGNOSIS — N6489 Other specified disorders of breast: Secondary | ICD-10-CM

## 2021-07-05 ENCOUNTER — Other Ambulatory Visit: Payer: Self-pay

## 2021-07-05 ENCOUNTER — Ambulatory Visit
Admission: RE | Admit: 2021-07-05 | Discharge: 2021-07-05 | Disposition: A | Discharge status: Home / Self Care | Payer: BC Managed Care – PPO | Source: Ambulatory Visit | Attending: Internal Medicine | Admitting: Internal Medicine

## 2021-07-05 DIAGNOSIS — R102 Pelvic and perineal pain: Secondary | ICD-10-CM | POA: Diagnosis not present

## 2021-07-05 DIAGNOSIS — N281 Cyst of kidney, acquired: Secondary | ICD-10-CM | POA: Insufficient documentation

## 2021-07-05 DIAGNOSIS — N6489 Other specified disorders of breast: Secondary | ICD-10-CM

## 2021-07-05 DIAGNOSIS — Z9889 Other specified postprocedural states: Secondary | ICD-10-CM | POA: Diagnosis not present

## 2021-07-05 DIAGNOSIS — R928 Other abnormal and inconclusive findings on diagnostic imaging of breast: Secondary | ICD-10-CM | POA: Insufficient documentation

## 2021-07-05 DIAGNOSIS — R188 Other ascites: Secondary | ICD-10-CM | POA: Diagnosis not present

## 2021-07-05 DIAGNOSIS — N83202 Unspecified ovarian cyst, left side: Secondary | ICD-10-CM | POA: Diagnosis not present

## 2021-07-05 DIAGNOSIS — N200 Calculus of kidney: Secondary | ICD-10-CM | POA: Diagnosis not present

## 2021-07-05 DIAGNOSIS — K449 Diaphragmatic hernia without obstruction or gangrene: Secondary | ICD-10-CM | POA: Diagnosis not present

## 2021-07-05 DIAGNOSIS — R1032 Left lower quadrant pain: Secondary | ICD-10-CM | POA: Insufficient documentation

## 2021-07-05 DIAGNOSIS — R922 Inconclusive mammogram: Secondary | ICD-10-CM | POA: Diagnosis not present

## 2021-07-08 NOTE — Progress Notes (Signed)
[place] no cancer f/u screening mammogram in 1 year

## 2021-07-08 NOTE — Progress Notes (Signed)
mammogram no cancer f/u screening mammogram in 1 year

## 2021-07-10 ENCOUNTER — Telehealth: Payer: Self-pay | Admitting: Internal Medicine

## 2021-07-10 NOTE — Telephone Encounter (Signed)
Called and Patient informed. Patient verbalized understanding

## 2021-07-10 NOTE — Telephone Encounter (Signed)
Pt returning call regarding imaging

## 2021-07-13 NOTE — Progress Notes (Signed)
Noted will do in future Dr. Gayland Curry

## 2021-07-17 DIAGNOSIS — H93293 Other abnormal auditory perceptions, bilateral: Secondary | ICD-10-CM | POA: Diagnosis not present

## 2021-07-17 DIAGNOSIS — J301 Allergic rhinitis due to pollen: Secondary | ICD-10-CM | POA: Diagnosis not present

## 2021-07-22 DIAGNOSIS — R1013 Epigastric pain: Secondary | ICD-10-CM | POA: Diagnosis not present

## 2021-07-22 DIAGNOSIS — Z8371 Family history of colonic polyps: Secondary | ICD-10-CM | POA: Diagnosis not present

## 2021-07-23 ENCOUNTER — Telehealth: Payer: BC Managed Care – PPO | Admitting: Neurology

## 2021-08-30 DIAGNOSIS — Z20822 Contact with and (suspected) exposure to covid-19: Secondary | ICD-10-CM | POA: Diagnosis not present

## 2021-08-30 DIAGNOSIS — Z03818 Encounter for observation for suspected exposure to other biological agents ruled out: Secondary | ICD-10-CM | POA: Diagnosis not present

## 2021-09-12 ENCOUNTER — Other Ambulatory Visit: Payer: BC Managed Care – PPO

## 2021-09-12 DIAGNOSIS — Z8371 Family history of colonic polyps: Secondary | ICD-10-CM | POA: Diagnosis not present

## 2021-09-12 DIAGNOSIS — K2950 Unspecified chronic gastritis without bleeding: Secondary | ICD-10-CM | POA: Diagnosis not present

## 2021-09-12 DIAGNOSIS — K640 First degree hemorrhoids: Secondary | ICD-10-CM | POA: Diagnosis not present

## 2021-09-12 DIAGNOSIS — Z980 Intestinal bypass and anastomosis status: Secondary | ICD-10-CM | POA: Diagnosis not present

## 2021-09-12 DIAGNOSIS — R1013 Epigastric pain: Secondary | ICD-10-CM | POA: Diagnosis not present

## 2021-09-12 DIAGNOSIS — K449 Diaphragmatic hernia without obstruction or gangrene: Secondary | ICD-10-CM | POA: Diagnosis not present

## 2021-09-12 DIAGNOSIS — Z1211 Encounter for screening for malignant neoplasm of colon: Secondary | ICD-10-CM | POA: Diagnosis not present

## 2021-09-12 DIAGNOSIS — K6389 Other specified diseases of intestine: Secondary | ICD-10-CM | POA: Diagnosis not present

## 2021-09-12 LAB — HM COLONOSCOPY

## 2021-09-13 ENCOUNTER — Other Ambulatory Visit: Payer: Self-pay

## 2021-09-13 ENCOUNTER — Other Ambulatory Visit (INDEPENDENT_AMBULATORY_CARE_PROVIDER_SITE_OTHER): Payer: BC Managed Care – PPO

## 2021-09-13 DIAGNOSIS — E559 Vitamin D deficiency, unspecified: Secondary | ICD-10-CM

## 2021-09-13 DIAGNOSIS — R102 Pelvic and perineal pain: Secondary | ICD-10-CM

## 2021-09-13 DIAGNOSIS — Z13220 Encounter for screening for lipoid disorders: Secondary | ICD-10-CM | POA: Diagnosis not present

## 2021-09-13 DIAGNOSIS — N83202 Unspecified ovarian cyst, left side: Secondary | ICD-10-CM | POA: Diagnosis not present

## 2021-09-13 DIAGNOSIS — R5383 Other fatigue: Secondary | ICD-10-CM | POA: Diagnosis not present

## 2021-09-13 DIAGNOSIS — E611 Iron deficiency: Secondary | ICD-10-CM | POA: Diagnosis not present

## 2021-09-13 DIAGNOSIS — R946 Abnormal results of thyroid function studies: Secondary | ICD-10-CM | POA: Diagnosis not present

## 2021-09-13 LAB — LIPID PANEL
Cholesterol: 132 mg/dL (ref 0–200)
HDL: 59.1 mg/dL (ref >39.00)
LDL Cholesterol: 62 mg/dL (ref 0–99)
NonHDL: 72.95
Total CHOL/HDL Ratio: 2
Triglycerides: 54 mg/dL (ref 0.0–149.0)
VLDL: 10.8 mg/dL (ref 0.0–40.0)

## 2021-09-13 LAB — CBC WITH DIFFERENTIAL/PLATELET
Basophils Absolute: 0 10*3/uL (ref 0.0–0.1)
Basophils Relative: 0.4 % (ref 0.0–3.0)
Eosinophils Absolute: 0 10*3/uL (ref 0.0–0.7)
Eosinophils Relative: 0.7 % (ref 0.0–5.0)
HCT: 37.4 % (ref 36.0–46.0)
Hemoglobin: 12.3 g/dL (ref 12.0–15.0)
Lymphocytes Relative: 43.2 % (ref 12.0–46.0)
Lymphs Abs: 2.1 10*3/uL (ref 0.7–4.0)
MCHC: 32.9 g/dL (ref 30.0–36.0)
MCV: 95.4 fl (ref 78.0–100.0)
Monocytes Absolute: 0.3 10*3/uL (ref 0.1–1.0)
Monocytes Relative: 6.7 % (ref 3.0–12.0)
Neutro Abs: 2.4 10*3/uL (ref 1.4–7.7)
Neutrophils Relative %: 49 % (ref 43.0–77.0)
Platelets: 226 10*3/uL (ref 150.0–400.0)
RBC: 3.92 Mil/uL (ref 3.87–5.11)
RDW: 12.7 % (ref 11.5–15.5)
WBC: 4.9 10*3/uL (ref 4.0–10.5)

## 2021-09-13 LAB — COMPREHENSIVE METABOLIC PANEL
ALT: 23 U/L (ref 0–35)
AST: 31 U/L (ref 0–37)
Albumin: 3.9 g/dL (ref 3.5–5.2)
Alkaline Phosphatase: 59 U/L (ref 39–117)
BUN: 17 mg/dL (ref 6–23)
CO2: 25 mEq/L (ref 19–32)
Calcium: 9 mg/dL (ref 8.4–10.5)
Chloride: 106 mEq/L (ref 96–112)
Creatinine, Ser: 1.15 mg/dL (ref 0.40–1.20)
GFR: 55.76 mL/min — ABNORMAL LOW (ref >60.00)
Glucose, Bld: 81 mg/dL (ref 70–99)
Potassium: 3.8 mEq/L (ref 3.5–5.1)
Sodium: 140 mEq/L (ref 135–145)
Total Bilirubin: 0.6 mg/dL (ref 0.2–1.2)
Total Protein: 7 g/dL (ref 6.0–8.3)

## 2021-09-13 LAB — TSH: TSH: 0.8 u[IU]/mL (ref 0.35–5.50)

## 2021-09-13 LAB — T4, FREE: Free T4: 0.9 ng/dL (ref 0.60–1.60)

## 2021-09-13 LAB — CORTISOL: Cortisol, Plasma: 9.8 ug/dL

## 2021-09-13 LAB — VITAMIN D 25 HYDROXY (VIT D DEFICIENCY, FRACTURES): VITD: 40.41 ng/mL (ref 30.00–100.00)

## 2021-09-13 LAB — TESTOSTERONE: Testosterone: 32.09 ng/dL (ref 15.00–40.00)

## 2021-09-14 LAB — IRON,TIBC AND FERRITIN PANEL
%SAT: 27 % (calc) (ref 16–45)
Ferritin: 101 ng/mL (ref 16–232)
Iron: 88 ug/dL (ref 40–190)
TIBC: 330 mcg/dL (calc) (ref 250–450)

## 2021-09-14 LAB — DHEA-SULFATE: DHEA-SO4: 98 ug/dL (ref 15–205)

## 2021-09-26 ENCOUNTER — Encounter: Payer: Self-pay | Admitting: Internal Medicine

## 2021-09-26 ENCOUNTER — Encounter: Payer: Self-pay | Admitting: Neurology

## 2021-09-26 MED ORDER — EMGALITY 120 MG/ML ~~LOC~~ SOAJ: 120.0000 mg | SUBCUTANEOUS | 5 refills | Status: DC

## 2021-11-01 DIAGNOSIS — K912 Postsurgical malabsorption, not elsewhere classified: Secondary | ICD-10-CM | POA: Diagnosis not present

## 2021-11-01 DIAGNOSIS — R6889 Other general symptoms and signs: Secondary | ICD-10-CM | POA: Diagnosis not present

## 2021-12-25 ENCOUNTER — Encounter: Payer: Self-pay | Admitting: Neurology

## 2021-12-25 DIAGNOSIS — G43009 Migraine without aura, not intractable, without status migrainosus: Secondary | ICD-10-CM

## 2021-12-25 MED ORDER — UBRELVY 100 MG PO TABS: 100.0000 mg | ORAL_TABLET | ORAL | 3 refills | Status: DC | PRN

## 2021-12-27 ENCOUNTER — Encounter: Payer: Self-pay | Admitting: Neurology

## 2021-12-30 NOTE — Telephone Encounter (Addendum)
Emgality PA completed on Cover My Meds. Key: [id number]. Awaiting determination from [place].  ?

## 2022-01-01 NOTE — Telephone Encounter (Addendum)
Emgality approved.  ? Letter states: ?"Dear Valerie Colon: ?We?re pleased to let you know that we?ve approved your or your doctor?s request for coverage ?for Emgality '120mg'$ /mL (galcanezumab-gnlm). You can now fill your prescription, and it will be ?covered according to your plan. ?As long as you remain covered by your prescription drug plan and there are no changes to your ?plan benefits, this request is approved from 12/30/2021 to 12/31/2022. When this approval ?expires, please speak to your doctor about your treatment." ? ?Updated pt and faxed approval letter to [facility] on [address] in [place]. Received a receipt of confirmation. ? ?

## 2022-01-01 NOTE — Telephone Encounter (Signed)
[name] PA completed on Cover My Meds. Key: [id number]. Approved by [employer] from 01/01/2022 to 01/01/2023. Approval letter faxed to pharmacy and pt has been updated.  ? ? ?

## 2022-01-31 ENCOUNTER — Ambulatory Visit: Payer: BC Managed Care – PPO | Admitting: Internal Medicine

## 2022-01-31 ENCOUNTER — Encounter: Payer: Self-pay | Admitting: Internal Medicine

## 2022-01-31 VITALS — BP 110/80 | HR 74 | Temp 98.4°F | Resp 14 | Ht 66.0 in | Wt 152.2 lb

## 2022-01-31 DIAGNOSIS — R5383 Other fatigue: Secondary | ICD-10-CM | POA: Diagnosis not present

## 2022-01-31 DIAGNOSIS — G4733 Obstructive sleep apnea (adult) (pediatric): Secondary | ICD-10-CM | POA: Diagnosis not present

## 2022-01-31 DIAGNOSIS — Z1231 Encounter for screening mammogram for malignant neoplasm of breast: Secondary | ICD-10-CM

## 2022-01-31 DIAGNOSIS — R0681 Apnea, not elsewhere classified: Secondary | ICD-10-CM | POA: Diagnosis not present

## 2022-01-31 NOTE — Patient Instructions (Addendum)
[facility]   Phone Fax E-mail Address  [phone]   [address]   [place] [address]     Specialties     Pulmonary Disease             Menopause Menopause is the normal time of a woman's life when menstrual periods stop completely. It marks the natural end to a woman's ability to become pregnant. It can be defined as the absence of a menstrual period for 12 months without another medical cause. The transition to menopause (perimenopause) most often happens between the ages of 37 and 42, and can last for many years. During perimenopause, hormone levels change in your body, which can cause symptoms and affect your health. Menopause may increase your risk for: Weakened bones (osteoporosis), which causes fractures. Depression. Hardening and narrowing of the arteries (atherosclerosis), which can cause heart attacks and strokes. What are the causes? This condition is usually caused by a natural change in hormone levels that happens as you get older. The condition may also be caused by changes that are not natural, including: Surgery to remove both ovaries (surgical menopause). Side effects from some medicines, such as chemotherapy used to treat cancer (chemical menopause). What increases the risk? This condition is more likely to start at an earlier age if you have certain medical conditions or have undergone treatments, including: A tumor of the pituitary gland in the brain. A disease that affects the ovaries and hormones. Certain cancer treatments, such as chemotherapy or hormone therapy, or radiation therapy on the pelvis. Heavy smoking and excessive alcohol use. Family history of early menopause. This condition is also more likely to develop earlier in women who are very thin. What are the signs or symptoms? Symptoms of this condition include: Hot flashes. Irregular menstrual periods. Night sweats. Changes in feelings about sex. This could be a decrease in sex  drive or an increased discomfort around your sexuality. Vaginal dryness and thinning of the vaginal walls. This may cause painful sex. Dryness of the skin and development of wrinkles. Headaches. Problems sleeping (insomnia). Mood swings or irritability. Memory problems. Weight gain. Hair growth on the face and chest. Bladder infections or problems with urinating. How is this diagnosed? This condition is diagnosed based on your medical history, a physical exam, your age, your menstrual history, and your symptoms. Hormone tests may also be done. How is this treated? In some cases, no treatment is needed. You and your health care provider should make a decision together about whether treatment is necessary. Treatment will be based on your individual condition and preferences. Treatment for this condition focuses on managing symptoms. Treatment may include: Menopausal hormone therapy (MHT). Medicines to treat specific symptoms or complications. Acupuncture. Vitamin or herbal supplements. Before starting treatment, make sure to let your health care provider know if you have a personal or family history of these conditions: Heart disease. Breast cancer. Blood clots. Diabetes. Osteoporosis. Follow these instructions at home: Lifestyle Do not use any products that contain nicotine or tobacco, such as cigarettes, e-cigarettes, and chewing tobacco. If you need help quitting, ask your health care provider. Get at least 30 minutes of physical activity on 5 or more days each week. Avoid alcoholic and caffeinated beverages, as well as spicy foods. This may help prevent hot flashes. Get 7-8 hours of sleep each night. If you have hot flashes, try: Dressing in layers. Avoiding things that may trigger hot flashes, such as spicy food, warm places,  or stress. Taking slow, deep breaths when a hot flash starts. Keeping a fan in your home and office. Find ways to manage stress, such as deep breathing,  meditation, or journaling. Consider going to group therapy with other women who are having menopause symptoms. Ask your health care provider about recommended group therapy meetings. Eating and drinking  Eat a healthy, balanced diet that contains whole grains, lean protein, low-fat dairy, and plenty of fruits and vegetables. Your health care provider may recommend adding more soy to your diet. Foods that contain soy include tofu, tempeh, and soy milk. Eat plenty of foods that contain calcium and vitamin D for bone health. Items that are rich in calcium include low-fat milk, yogurt, beans, almonds, sardines, broccoli, and kale. Medicines Take over-the-counter and prescription medicines only as told by your health care provider. Talk with your health care provider before starting any herbal supplements. If prescribed, take vitamins and supplements as told by your health care provider. General instructions  Keep track of your menstrual periods, including: When they occur. How heavy they are and how long they last. How much time passes between periods. Keep track of your symptoms, noting when they start, how often you have them, and how long they last. Use vaginal lubricants or moisturizers to help with vaginal dryness and improve comfort during sex. Keep all follow-up visits. This is important. This includes any group therapy or counseling. Contact a health care provider if: You are still having menstrual periods after age 67. You have pain during sex. You have not had a period for 12 months and you develop vaginal bleeding. Get help right away if you have: Severe depression. Excessive vaginal bleeding. Pain when you urinate. A fast or irregular heartbeat (palpitations). Severe headaches. Abdominal pain or severe indigestion. Summary Menopause is a normal time of life when menstrual periods stop completely. It is usually defined as the absence of a menstrual period for 12 months without  another medical cause. The transition to menopause (perimenopause) most often happens between the ages of 63 and 56 and can last for several years. Symptoms can be managed through medicines, lifestyle changes, and complementary therapies such as acupuncture. Eat a balanced diet that is rich in nutrients to promote bone health and heart health and to manage symptoms during menopause. This information is not intended to replace advice given to you by your health care provider. Make sure you discuss any questions you have with your health care provider. Document Revised: 05/04/2020 Document Reviewed: 01/19/2020 Elsevier Patient Education  [place].  Sleep Apnea Sleep apnea is a condition in which breathing pauses or becomes shallow during sleep. People with sleep apnea usually snore loudly. They may have times when they gasp and stop breathing for 10 seconds or more during sleep. This may happen many times during the night. Sleep apnea disrupts your sleep and keeps your body from getting the rest that it needs. This condition can increase your risk of certain health problems, including: Heart attack. Stroke. Obesity. Type 2 diabetes. Heart failure. Irregular heartbeat. High blood pressure. The goal of treatment is to help you breathe normally again. What are the causes?  The most common cause of sleep apnea is a collapsed or blocked airway. There are three kinds of sleep apnea: Obstructive sleep apnea. This kind is caused by a blocked or collapsed airway. Central sleep apnea. This kind happens when the part of the brain that controls breathing does not send the correct signals to the muscles  that control breathing. Mixed sleep apnea. This is a combination of obstructive and central sleep apnea. What increases the risk? You are more likely to develop this condition if you: Are overweight. Smoke. Have a smaller than normal airway. Are older. Are female. Drink alcohol. Take  sedatives or tranquilizers. Have a family history of sleep apnea. Have a tongue or tonsils that are larger than normal. What are the signs or symptoms? Symptoms of this condition include: Trouble staying asleep. Loud snoring. Morning headaches. Waking up gasping. Dry mouth or sore throat in the morning. Daytime sleepiness and tiredness. If you have daytime fatigue because of sleep apnea, you may be more likely to have: Trouble concentrating. Forgetfulness. Irritability or mood swings. Personality changes. Feelings of depression. Sexual dysfunction. This may include loss of interest if you are female, or erectile dysfunction if you are female. How is this diagnosed? This condition may be diagnosed with: A medical history. A physical exam. A series of tests that are done while you are sleeping (sleep study). These tests are usually done in a sleep lab, but they may also be done at home. How is this treated? Treatment for this condition aims to restore normal breathing and to ease symptoms during sleep. It may involve managing health issues that can affect breathing, such as high blood pressure or obesity. Treatment may include: Sleeping on your side. Using a decongestant if you have nasal congestion. Avoiding the use of depressants, including alcohol, sedatives, and narcotics. Losing weight if you are overweight. Making changes to your diet. Quitting smoking. Using a device to open your airway while you sleep, such as: An oral appliance. This is a custom-made mouthpiece that shifts your lower jaw forward. A continuous positive airway pressure (CPAP) device. This device blows air through a mask when you breathe out (exhale). A nasal expiratory positive airway pressure (EPAP) device. This device has valves that you put into each nostril. A bi-level positive airway pressure (BIPAP) device. This device blows air through a mask when you breathe in (inhale) and breathe out (exhale). Having  surgery if other treatments do not work. During surgery, excess tissue is removed to create a wider airway. Follow these instructions at home: Lifestyle Make any lifestyle changes that your health care provider recommends. Eat a healthy, well-balanced diet. Take steps to lose weight if you are overweight. Avoid using depressants, including alcohol, sedatives, and narcotics. Do not use any products that contain nicotine or tobacco. These products include cigarettes, chewing tobacco, and vaping devices, such as e-cigarettes. If you need help quitting, ask your health care provider. General instructions Take over-the-counter and prescription medicines only as told by your health care provider. If you were given a device to open your airway while you sleep, use it only as told by your health care provider. If you are having surgery, make sure to tell your health care provider you have sleep apnea. You may need to bring your device with you. Keep all follow-up visits. This is important. Contact a health care provider if: The device that you received to open your airway during sleep is uncomfortable or does not seem to be working. Your symptoms do not improve. Your symptoms get worse. Get help right away if: You develop: Chest pain. Shortness of breath. Discomfort in your back, arms, or stomach. You have: Trouble speaking. Weakness on one side of your body. Drooping in your face. These symptoms may represent a serious problem that is an emergency. Do not wait to see if  the symptoms will go away. Get medical help right away. Call your local emergency services (911 in the [place] Do not drive yourself to the hospital. Summary Sleep apnea is a condition in which breathing pauses or becomes shallow during sleep. The most common cause is a collapsed or blocked airway. The goal of treatment is to restore normal breathing and to ease symptoms during sleep. This information is not intended to replace  advice given to you by your health care provider. Make sure you discuss any questions you have with your health care provider. Document Revised: 03/13/2021 Document Reviewed: 07/13/2020 Elsevier Patient Education  [place].

## 2022-01-31 NOTE — Progress Notes (Signed)
Chief Complaint  Patient presents with   Referral    Disc referral to sleep study.   F/u  1. H/o sleep apnea prior to wt loss surgery and after c/o apneic episodes, daytime sleepiness/fatigue and falling asleep at the wheel and am h/a h/o migraines on ubrevly and emgality wants referral to repeat sleep study  She is sleeping 7-8 hours at night but has found herself waking herself up at night stopping breathing   Review of Systems  Constitutional:  Positive for malaise/fatigue. Negative for weight loss.  HENT:  Negative for hearing loss.   Eyes:  Negative for blurred vision.  Respiratory:  Negative for shortness of breath.   Cardiovascular:  Negative for chest pain.  Gastrointestinal:  Negative for abdominal pain and blood in stool.  Genitourinary:  Negative for dysuria.  Musculoskeletal:  Negative for falls and joint pain.  Skin:  Negative for rash.  Neurological:  Positive for headaches.  Psychiatric/Behavioral:  Negative for depression.    Past Medical History:  Diagnosis Date   Acid reflux    Allergic rhinitis    Allergy    Anxiety    COVID-19    09/2020   Heart murmur    History of hiatal hernia    Kidney stones    Mass of finger    myxoid neurofibroma multiple   Migraine    Ovarian cyst, left    Past Surgical History:  Procedure Laterality Date   ABDOMINAL HYSTERECTOMY     2008; left ovary still intact   BREAST BIOPSY Right 12/19/2013   stereo right- neg   CHOLECYSTECTOMY     COLONOSCOPY WITH PROPOFOL N/A 05/05/2016   Procedure: COLONOSCOPY WITH PROPOFOL;  Surgeon: Lollie Sails, MD;  Location: [facility] ENDOSCOPY;  Service: Endoscopy;  Laterality: N/A;   ESOPHAGOGASTRODUODENOSCOPY (EGD) WITH PROPOFOL N/A 05/05/2016   Procedure: ESOPHAGOGASTRODUODENOSCOPY (EGD) WITH PROPOFOL;  Surgeon: Lollie Sails, MD;  Location: [facility] ENDOSCOPY;  Service: Endoscopy;  Laterality: N/A;   EXTRACORPOREAL SHOCK WAVE LITHOTRIPSY Left 11/06/2016   Procedure: EXTRACORPOREAL SHOCK WAVE  LITHOTRIPSY (ESWL);  Surgeon: Nickie Retort, MD;  Location: [facility] ORS;  Service: Urology;  Laterality: Left;   gallbladder  2012   GASTRIC BYPASS  2012   REDUCTION MAMMAPLASTY Bilateral    2010   Family History  Problem Relation Age of Onset   Prostate cancer Father    Hyperlipidemia Father    Hypertension Father    Depression Father    Kidney Stones Maternal Grandfather    Stroke Maternal Grandfather    Cancer Maternal Grandfather        prostate cancer    Diabetes Mother    Hyperlipidemia Mother    Hypertension Mother    Osteoporosis Mother        ? if has this    Multiple sclerosis Sister    Diabetes Sister    Heart disease Maternal Grandmother    Cancer Other        breast cancer    Breast cancer Other    Osteoporosis Maternal Aunt    Kidney disease Neg Hx    Bladder Cancer Neg Hx    Social History   Socioeconomic History   Marital status: Divorced    Spouse name: Not on file   Number of children: Not on file   Years of education: Not on file   Highest education level: Not on file  Occupational History   Not on file  Tobacco Use   Smoking status: Never  Smokeless tobacco: Never  Substance and Sexual Activity   Alcohol use: No    Alcohol/week: 0.0 standard drinks of alcohol   Drug use: No   Sexual activity: Not on file  Other Topics Concern   Not on file  Social History Narrative   Bachelors    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   Galcanezumab-gnlm (EMGALITY) 120 MG/ML SOAJ Inject 120 mg into the skin every 30 (thirty) days.   Multiple Vitamin (MULTIVITAMIN) tablet Take 1 tablet by mouth daily.   ondansetron (ZOFRAN ODT) 4 MG disintegrating tablet Take 1 tablet (4 mg total) by mouth every 8 (eight) hours as needed for nausea or vomiting.   Ubrogepant (UBRELVY) 100 MG TABS Take 100 mg by mouth every 2 (two) hours as needed (migraine.). Take as close to onset of migraine as possible. DO NOT EXCEED 200 MG IN 24 HOURS.   No Known Allergies No results found for this or any previous visit (from the past 2160 hour(s)). Objective  Body mass index is 24.57 kg/m. Wt Readings from Last 3 Encounters:  01/31/22 152 lb 3.2 oz (69 kg)  06/11/21 199 lb 12.8 oz (90.6 kg)  01/29/21 201 lb 9.6 oz (91.4 kg)   Temp Readings from Last 3 Encounters:  01/31/22 98.4 F (36.9 C) (Oral)  06/11/21 (!) 97.5 F (36.4 C) (Oral)  12/29/20 97.8 F (36.6 C) (Oral)   BP Readings from Last 3 Encounters:  01/31/22 110/80  06/11/21 114/76  01/29/21 127/63   Pulse Readings from Last 3 Encounters:  01/31/22 74  06/11/21 76  01/29/21 75    Physical Exam Vitals and nursing note reviewed.  Constitutional:      Appearance: Normal appearance. She is well-developed and well-groomed.  HENT:     Head: Normocephalic and atraumatic.  Eyes:     Conjunctiva/sclera: Conjunctivae normal.     Pupils: Pupils are equal, round, and reactive to light.  Cardiovascular:     Rate and Rhythm: Normal rate and regular rhythm.     Heart sounds: Normal heart sounds. No murmur heard. Pulmonary:     Effort: Pulmonary effort is normal.     Breath sounds: Normal breath sounds.  Abdominal:     General: Abdomen is flat. Bowel sounds are normal.     Tenderness: There is no abdominal tenderness.  Musculoskeletal:        General: No tenderness.  Skin:    General: Skin  is warm and dry.  Neurological:     General: No focal deficit present.     Mental Status: She is alert and oriented to person, place, and time. Mental status is at baseline.     Cranial Nerves: Cranial nerves 2-12 are intact.  Motor: Motor function is intact.     Coordination: Coordination is intact.     Gait: Gait is intact.  Psychiatric:        Attention and Perception: Attention and perception normal.        Mood and Affect: Mood and affect normal.        Speech: Speech normal.        Behavior: Behavior normal. Behavior is cooperative.        Thought Content: Thought content normal.        Cognition and Memory: Cognition and memory normal.        Judgment: Judgment normal.     Assessment  Plan  OSA (obstructive sleep apnea) - Plan: Ambulatory referral to Pulmonology home sleep study  Apnea - Plan: Ambulatory referral to Pulmonology Fatigue, unspecified type - Plan: Ambulatory referral to Pulmonology  HM Flu shot given utd covid vx moderna 4/4 Tdap had 01/24/16 and 01/16/18   Hep B immune. [facility]  Consider check HIV/hep C/MMR in future   S/p hysterectomy only left +hemorrhagic cyst with right ovary f/u OB/GYN [place] pap had 08/11/2019 [facility] normal  with hot flashes disc amberen vs estrovan otc    Mammo recall 06/2021 and repeat screening 1 year ordered   Colonoscopy  had 04/2016 [facility] repeat rec in 5 years  [facility] 09/12/21 utd [facility]   Normal 10/31/222 DEXA FH osteoporosis      Continue healthy diet and exercise     Provider: Dr. Olivia Mackie McLean-Scocuzza-Internal Medicine

## 2022-02-20 ENCOUNTER — Encounter: Payer: Self-pay | Admitting: Pulmonary Disease

## 2022-02-20 ENCOUNTER — Ambulatory Visit (INDEPENDENT_AMBULATORY_CARE_PROVIDER_SITE_OTHER): Payer: BC Managed Care – PPO | Admitting: Pulmonary Disease

## 2022-02-20 VITALS — BP 116/68 | HR 65 | Temp 97.9°F | Ht 65.0 in | Wt 157.0 lb

## 2022-02-20 DIAGNOSIS — R0683 Snoring: Secondary | ICD-10-CM

## 2022-02-20 NOTE — Progress Notes (Signed)
[facility]  Chief Complaint  Patient presents with   Consult    Pt referred for a sleep study b/c past history of sleep apnea. Pt waking up with headaches and excessively tired.     Past Surgical History:  She  has a past surgical history that includes Gastric bypass (2012); gallbladder (2012); Cholecystectomy; Colonoscopy with propofol (N/A, 05/05/2016); Esophagogastroduodenoscopy (egd) with propofol (N/A, 05/05/2016); Extracorporeal shock wave lithotripsy (Left, 11/06/2016); Abdominal hysterectomy; Breast biopsy (Right, 12/19/2013); and Reduction mammaplasty (Bilateral).  Past Medical History:  GERD, Allergies, Anxiety, COVID 06 October 2020, Hiatal hernia, Nephrolithiasis, Migraine headache  Constitutional:  BP 116/68 (BP Location: Right Arm, Patient Position: Sitting, Cuff Size: Normal)   Pulse 65   Temp 97.9 F (36.6 C) (Oral)   Ht '5\' 5"'$  (1.651 m)   Wt 157 lb (71.2 kg)   LMP  (LMP Unknown) Comment: neg upt  SpO2 99%   BMI 26.13 kg/m   Brief Summary:  Valerie Colon is a 51 y.o. female with snoring.      Subjective:   She was previously diagnosed with sleep apnea in 2009 and used CPAP.  She had gastric bypass in 2012 and lost weight.  She transitioned off of CPAP therapy.  She has been going through menopause and noticed more trouble feeling sleepy.  She is snoring more and stops breathing when asleep.  She has mouth guard for bruxism, but doesn't wear this consistently.  She is a restless sleeper and gets cramps sometimes in her legs.  She has trouble staying awake when working on a computer or watching TV.  She goes to sleep at 9 pm.  She falls asleep in 10 minutes.  She wakes up some times to use the bathroom.  She gets out of bed at 5 am.  She feels tired in the morning.  She is prone to gettting headaches.  She does not use anything to help her fall sleep.  She will use "Oh Snap" energy boost to help stay awake.  She denies  sleep walking, sleep talking, bruxism, or nightmares.  There is no history of restless legs.  She denies sleep hallucinations, sleep paralysis, or cataplexy.  The Epworth score is 19 out of 24.   Physical Exam:   Appearance - well kempt   ENMT - no sinus tenderness, no oral exudate, no LAN, Mallampati 4 airway, no stridor, scalloped tongue  Respiratory - equal breath sounds bilaterally, no wheezing or rales  CV - s1s2 regular rate and rhythm, no murmurs  Ext - no clubbing, no edema  Skin - no rashes  Psych - normal mood and affect   Sleep Tests:    Social History:  She  reports that she has never smoked. She has been exposed to tobacco smoke. She has never used smokeless tobacco. She reports that she does not drink alcohol and does not use drugs.  Family History:  Her family history includes Breast cancer in an other family member; Cancer in her maternal grandfather and another family member; Depression in her father; Diabetes in her mother and sister; Heart disease in her maternal grandmother; Hyperlipidemia in her father and mother; Hypertension in her father and mother; Kidney Stones in her maternal grandfather; Multiple sclerosis in her sister; Osteoporosis in her maternal aunt and mother; Prostate cancer in her father; Stroke in her maternal grandfather.    Discussion:  She has snoring, sleep disruption, apnea, and daytime sleepiness.  I am concerned she  could have obstructive sleep apnea again.  Assessment/Plan:   Snoring with excessive daytime sleepiness. - will need to arrange for a home sleep study  Obesity. - discussed how weight can impact sleep and risk for sleep disordered breathing - discussed options to assist with weight loss: combination of diet modification, cardiovascular and strength training exercises  Cardiovascular risk. - had an extensive discussion regarding the adverse health consequences related to untreated sleep disordered breathing -  specifically discussed the risks for hypertension, coronary artery disease, cardiac dysrhythmias, cerebrovascular disease, and diabetes - lifestyle modification discussed  Safe driving practices. - discussed how sleep disruption can increase risk of accidents, particularly when driving - safe driving practices were discussed  Therapies for obstructive sleep apnea. - if the sleep study shows significant sleep apnea, then various therapies for treatment were reviewed: CPAP, oral appliance, and surgical interventions    Time Spent Involved in Patient Care on Day of Examination:  35 minutes  Follow up:   Patient Instructions  Will arrange for home sleep study to be picked up from [facility] office  Will call to arrange for follow up after sleep study reviewed   Medication List:   Allergies as of 02/20/2022   No Known Allergies      Medication List        Accurate as of February 20, 2022  4:09 PM. If you have any questions, ask your nurse or doctor.          STOP taking these medications    ferrous sulfate 325 (65 FE) MG EC tablet Stopped by: Chesley Mires, MD   furosemide 20 MG tablet Commonly known as: Lasix Stopped by: Chesley Mires, MD   Magnesium 400 MG Caps Stopped by: Chesley Mires, MD   VITAMIN D3 PO Stopped by: Chesley Mires, MD       TAKE these medications    CALCIUM PO Take by mouth.   Emgality 120 MG/ML Soaj Generic drug: Galcanezumab-gnlm Inject 120 mg into the skin every 30 (thirty) days.   multivitamin tablet Take 1 tablet by mouth daily.   ondansetron 4 MG disintegrating tablet Commonly known as: Zofran ODT Take 1 tablet (4 mg total) by mouth every 8 (eight) hours as needed for nausea or vomiting.   Ubrelvy 100 MG Tabs Generic drug: Ubrogepant Take 100 mg by mouth every 2 (two) hours as needed (migraine.). Take as close to onset of migraine as possible. DO NOT EXCEED 200 MG IN 24 HOURS.        Signature:  Chesley Mires, MD [place] Pager - [phone] 02/20/2022, 4:09 PM

## 2022-02-20 NOTE — Patient Instructions (Signed)
Will arrange for home sleep study to be picked up from [facility] office  Will call to arrange for follow up after sleep study reviewed

## 2022-03-10 ENCOUNTER — Encounter: Payer: Self-pay | Admitting: Pulmonary Disease

## 2022-03-18 DIAGNOSIS — J00 Acute nasopharyngitis [common cold]: Secondary | ICD-10-CM | POA: Diagnosis not present

## 2022-03-18 DIAGNOSIS — Z6825 Body mass index (BMI) 25.0-25.9, adult: Secondary | ICD-10-CM | POA: Diagnosis not present

## 2022-03-18 DIAGNOSIS — Z20822 Contact with and (suspected) exposure to covid-19: Secondary | ICD-10-CM | POA: Diagnosis not present

## 2022-03-18 DIAGNOSIS — Z03818 Encounter for observation for suspected exposure to other biological agents ruled out: Secondary | ICD-10-CM | POA: Diagnosis not present

## 2022-04-03 ENCOUNTER — Encounter: Payer: Self-pay | Admitting: Neurology

## 2022-04-03 MED ORDER — EMGALITY 120 MG/ML ~~LOC~~ SOAJ: 120.0000 mg | SUBCUTANEOUS | 5 refills | Status: DC

## 2022-04-11 ENCOUNTER — Ambulatory Visit: Payer: BC Managed Care – PPO

## 2022-04-11 DIAGNOSIS — R0683 Snoring: Secondary | ICD-10-CM

## 2022-04-24 ENCOUNTER — Telehealth (INDEPENDENT_AMBULATORY_CARE_PROVIDER_SITE_OTHER): Payer: BLUE CROSS/BLUE SHIELD | Admitting: Neurology

## 2022-04-24 ENCOUNTER — Encounter: Payer: Self-pay | Admitting: Neurology

## 2022-04-24 ENCOUNTER — Ambulatory Visit: Payer: BC Managed Care – PPO

## 2022-04-24 ENCOUNTER — Telehealth: Payer: Self-pay | Admitting: Neurology

## 2022-04-24 DIAGNOSIS — G43009 Migraine without aura, not intractable, without status migrainosus: Secondary | ICD-10-CM

## 2022-04-24 DIAGNOSIS — G4733 Obstructive sleep apnea (adult) (pediatric): Secondary | ICD-10-CM | POA: Diagnosis not present

## 2022-04-24 DIAGNOSIS — G43709 Chronic migraine without aura, not intractable, without status migrainosus: Secondary | ICD-10-CM | POA: Diagnosis not present

## 2022-04-24 DIAGNOSIS — R110 Nausea: Secondary | ICD-10-CM

## 2022-04-24 MED ORDER — UBRELVY 100 MG PO TABS: 100.0000 mg | ORAL_TABLET | ORAL | 3 refills | Status: DC | PRN

## 2022-04-24 MED ORDER — EMGALITY 120 MG/ML ~~LOC~~ SOAJ: 120.0000 mg | SUBCUTANEOUS | 5 refills | Status: DC

## 2022-04-24 MED ORDER — ONDANSETRON 4 MG PO TBDP: 4.0000 mg | ORAL_TABLET | Freq: Three times a day (TID) | ORAL | 6 refills | Status: DC | PRN

## 2022-04-24 NOTE — Progress Notes (Signed)
[facility]    Provider:  Dr Jaynee Eagles Requesting Provider: McLean-Scocuzza, Olivia Mackie * Primary Care Provider:  McLean-Scocuzza, Nino Glow, MD  CC:  migraines  Virtual Visit via Video Note  I connected with Valerie Colon on 04/24/22 at 10:45 AM EDT by a video enabled telemedicine application and verified that I am speaking with the correct person using two identifiers.  Location: Patient: home Provider: office   I discussed the limitations of evaluation and management by telemedicine and the availability of in person appointments. The patient expressed understanding and agreed to proceed.   I discussed the assessment and treatment plan with the patient. The patient was provided an opportunity to ask questions and all were answered. The patient agreed with the plan and demonstrated an understanding of the instructions.   The patient was advised to call back or seek an in-person evaluation if the symptoms worsen or if the condition fails to improve as anticipated.  I provided 20 minutes of non-face-to-face time during this encounter.   Melvenia Beam, MD  04/24/2021:  She is still doing well on [place] and [employer]. At baseline she had daily migraines and on the Emgality reduced to 4 migraine days a month and <7 total headache days a month in which she uses [place]. She has to redo her sleep study, she is on the process, she had a hx of sleep apnea before she lost weiht and was waking with a mild headache and fatigue is being retested otherwise No other focal neurologic deficits, associated symptoms, inciting events or modifiable factors.  Patient complains of symptoms per HPI as well as the following symptoms: fatigue, hx of sleep apnea . Pertinent negatives and positives per HPI. All others negative   06/02/2021: [name] is helping. Discussed preventative. Side effects to topiramate. Tried verapamil, gabapentin, topiramate(not helping), nortriptyline.  She has  episodic migraines, less than 8 migraine days a month, the [name] is helping, none of the other medications have helped, she has tried multiple classes, Ajovy is approved for episodic migraines we will see if we will get it approved or she can use the co-pay card. Cont ubrelvy.    Meds ordered this encounter  Medications   ondansetron (ZOFRAN ODT) 4 MG disintegrating tablet    Sig: Take 1 tablet (4 mg total) by mouth every 8 (eight) hours as needed for nausea or vomiting.    Dispense:  30 tablet    Refill:  6   Galcanezumab-gnlm (EMGALITY) 120 MG/ML SOAJ    Sig: Inject 120 mg into the skin every 30 (thirty) days.    Dispense:  1 mL    Refill:  5   Ubrogepant (UBRELVY) 100 MG TABS    Sig: Take 100 mg by mouth every 2 (two) hours as needed (migraine.). Take as close to onset of migraine as possible. DO NOT EXCEED 200 MG IN 24 HOURS.    Dispense:  16 tablet    Refill:  3    Patient has tried Sumatriptan and Rizatriptan     HPI:  Valerie Colon is a 51 y.o. female here as requested by McLean-Scocuzza, Olivia Mackie * for headaches.   Since the end of April  she ws having this intense pressure in the back of her head, never had anything before, started abruptly and severe, worst headache of life severe and acute and intense pressure, all across the back and radiating down the neck, associated neck pain and tightness. She went to the ED and a migraine  and had a CT of the head. Her pcp tried to order an MRI of the brain. For the last month getting pain on the left side of the head, associated with nausea, dizziness, throbbing/pulsating, nausea, no vision changes, worse with movement, happens every day.  If severe gets blurry vision. She had sleep apnea in the past and lost weight. But feels rested and not waking with with headaches, she is on the computer, she wears blu light blocker to help with the light sensitivity. Sumatriptan did not help at all, headaches can get worse positionally, Not exertional,  no aura, they are daily. Ondansetron for nausea helps. Uncle has headaches. No medication overuse. Has had hysterectomy. Can last all day. No other focal neurologic deficits, associated symptoms, inciting events or modifiable factors.  Reviewed notes, labs and imaging from outside physicians, which showed:  01/03/2021: TSH normal, BUN 22 and cr 1.14  CT HEAD WITHOUT CONTRAST   TECHNIQUE: Contiguous axial images were obtained from the base of the skull through the vertex without intravenous contrast.   COMPARISON:  None.   FINDINGS: Brain: No evidence of acute infarction, hemorrhage, hydrocephalus, extra-axial collection or mass lesion/mass effect.   Vascular: No hyperdense vessel or unexpected calcification.   Skull: Normal. Negative for fracture or focal lesion.   Sinuses/Orbits: Visualized paranasal sinuses and mastoid air cells are predominantly clear. Pneumatized petrous apices. Orbits are grossly unremarkable.   Other: None   IMPRESSION: No acute intracranial findings. Personally reviewed and agree.   Review of Systems: Patient complains of symptoms per HPI as well as the following symptoms headaches all others neg. Pertinent negatives and positives per HPI. All others negative.   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 [employer] in [place]   Married to significant other >as of 11/08/19 separated as of  01/31/22 dating partner lives in [place]    Wears seatbelt    Has gun at home    Safe in relationship          Caffeine: none   Right handed   Lives by herself   Social Determinants of Health   Financial Resource Strain: Not on file  Food Insecurity: Not on file  Transportation Needs: Not on file  Physical Activity: Not on file  Stress: Not on file  Social Connections: Not on file  Intimate Partner Violence: Not on file    Family History  Problem Relation Age of Onset   Prostate cancer Father    Hyperlipidemia Father    Hypertension Father    Depression Father    Kidney Stones Maternal Grandfather    Stroke Maternal Grandfather    Cancer Maternal Grandfather        prostate cancer    Diabetes Mother  Hyperlipidemia Mother    Hypertension Mother    Osteoporosis Mother        ? if has this    Multiple sclerosis Sister    Diabetes Sister    Heart disease Maternal Grandmother    Cancer Other        breast cancer    Breast cancer Other    Osteoporosis Maternal Aunt    Kidney disease Neg Hx    Bladder Cancer Neg Hx     Past Medical History:  Diagnosis Date   Acid reflux    Allergic rhinitis    Allergy    Anxiety    COVID-19    09/2020   Heart murmur    History of hiatal hernia    Kidney stones    Mass of finger    myxoid neurofibroma multiple   Migraine    Ovarian cyst, left     Patient Active Problem List   Diagnosis Date Noted   Chronic migraine without aura without status migrainosus, not intractable 04/24/2022   Abnormal thyroid function test 01/10/2021   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   ANA positive 05/16/2020   Hiatal hernia 05/16/2020   Benign cyst of right kidney 05/16/2020   Annual physical exam 05/06/2019   Family history of osteoporosis 05/07/2018   Cyst of left ovary 05/07/2018   Cardiac murmur 02/12/2018   Postprandial hypoglycemia 02/12/2018   Benign neoplasm of soft tissues of left upper extremity 11/23/2017    Epigastric abdominal pain 11/13/2017   Hypotension 11/13/2017   Chronic gastritis 10/09/2017   Migraine without aura and without status migrainosus, not intractable 10/09/2017   Soft tissue lesion of elbow region 10/09/2017   Chronic pain of both knees 10/09/2017   Shoulder pain, right 10/09/2017   Pelvic pain 10/09/2017   Breast calcifications 10/09/2017   Increased frequency of headaches 10/02/2017   Snoring 07/31/2017   Sprain of ligament of tarsometatarsal joint 04/01/2016   Nephrolithiasis 02/29/2016   Chronic fatigue 01/24/2016   Dizziness 01/24/2016   Gastroesophageal reflux disease without esophagitis 01/24/2016   GAD (generalized anxiety disorder) 11/20/2015   Emotional stress 11/09/2015   Intermittent palpitations 11/09/2015   Vitamin D deficiency 07/09/2015   Allergic rhinitis 03/29/2015   Chronic anemia 01/08/2015   Prediabetes 01/08/2015   Obstructive apnea 01/20/2014   Hypoglycemia after GI (gastrointestinal) surgery 11/26/2012    Past Surgical History:  Procedure Laterality Date   ABDOMINAL HYSTERECTOMY     2008; left ovary still intact   BREAST BIOPSY Right 12/19/2013   stereo right- neg   CHOLECYSTECTOMY     COLONOSCOPY WITH PROPOFOL N/A 05/05/2016   Procedure: COLONOSCOPY WITH PROPOFOL;  Surgeon: Lollie Sails, MD;  Location: [facility] ENDOSCOPY;  Service: Endoscopy;  Laterality: N/A;   ESOPHAGOGASTRODUODENOSCOPY (EGD) WITH PROPOFOL N/A 05/05/2016   Procedure: ESOPHAGOGASTRODUODENOSCOPY (EGD) WITH PROPOFOL;  Surgeon: Lollie Sails, MD;  Location: [facility] ENDOSCOPY;  Service: Endoscopy;  Laterality: N/A;   EXTRACORPOREAL SHOCK WAVE LITHOTRIPSY Left 11/06/2016   Procedure: EXTRACORPOREAL SHOCK WAVE LITHOTRIPSY (ESWL);  Surgeon: Nickie Retort, MD;  Location: [facility] ORS;  Service: Urology;  Laterality: Left;   gallbladder  2012   GASTRIC BYPASS  2012   REDUCTION MAMMAPLASTY Bilateral    2010    Current Outpatient Medications  Medication Sig Dispense Refill    CALCIUM PO Take by mouth.     Galcanezumab-gnlm (EMGALITY) 120 MG/ML SOAJ Inject 120 mg into the skin every 30 (thirty) days. 1 mL 5   Multiple Vitamin (MULTIVITAMIN)  tablet Take 1 tablet by mouth daily.     ondansetron (ZOFRAN ODT) 4 MG disintegrating tablet Take 1 tablet (4 mg total) by mouth every 8 (eight) hours as needed for nausea or vomiting. 30 tablet 6   Ubrogepant (UBRELVY) 100 MG TABS Take 100 mg by mouth every 2 (two) hours as needed (migraine.). Take as close to onset of migraine as possible. DO NOT EXCEED 200 MG IN 24 HOURS. 16 tablet 3   No current facility-administered medications for this visit.    Allergies as of 04/24/2022   (No Known Allergies)    Vitals: LMP  (LMP Unknown) Comment: neg upt Last Weight:  Wt Readings from Last 1 Encounters:  02/20/22 157 lb (71.2 kg)   Last Height:   Ht Readings from Last 1 Encounters:  02/20/22 '5\' 5"'$  (1.651 m)    Physical exam: Exam: Gen: NAD, conversant      CV: Denies palpitations or chest pain or SOB. VS: Breathing at a normal rate. Weight appears within normal limits. Not febrile. Eyes: Conjunctivae clear without exudates or hemorrhage  Neuro: Detailed Neurologic Exam  Speech:    Speech is normal; fluent and spontaneous with normal comprehension.  Cognition:    The patient is oriented to person, place, and time;     recent and remote memory intact;     language fluent;     normal attention, concentration,     fund of knowledge Cranial Nerves:    The pupils are equal, round, and reactive to light. Cannot perform fundoscopic exam. Visual fields are full to finger confrontation. Extraocular movements are intact.  The face is symmetric with normal sensation. The palate elevates in the midline. Hearing intact. Voice is normal. Shoulder shrug is normal. The tongue has normal motion without fasciculations.   Coordination:    Normal finger to nose  Gait:    Normal native gait  Motor Observation:   no involuntary  movements noted. Tone:    Appears normal  Posture:    Posture is normal. normal erect    Strength:    Strength is anti-gravity and symmetric in the upper and lower limbs.      Sensation: intact to LT          Assessment/Plan:  She is still doing well on [place] and [employer]. At baseline she had daily migraines and on the Emgality reduced to 4 migraine days a month and <7 total headache days a month in which she uses [place].   Chronic migraines: refill emgality Acute management: Roselyn MeierRoselyn Meier helps. Failed sumatriptan, rizatriptan Please take one tablet at the onset of your headache. If it does not improve the symptoms please take one additional tablet.  Sent note to staff to call for follow up  - MRI brain 02/06/2021 unremarkable.   - after further discussion she has tried nortriptyline in the past, verapamil, gabapentin, topamax did not tolerate or help. Tried Ajovy had to switch to [employer] due to insurance, aimovig contraindicated due to constipation, ubrelvy.   Meds ordered this encounter  Medications   ondansetron (ZOFRAN ODT) 4 MG disintegrating tablet    Sig: Take 1 tablet (4 mg total) by mouth every 8 (eight) hours as needed for nausea or vomiting.    Dispense:  30 tablet    Refill:  6   Galcanezumab-gnlm (EMGALITY) 120 MG/ML SOAJ    Sig: Inject 120 mg into the skin every 30 (thirty) days.    Dispense:  1 mL    Refill:  5   Ubrogepant (UBRELVY) 100 MG TABS    Sig: Take 100 mg by mouth every 2 (two) hours as needed (migraine.). Take as close to onset of migraine as possible. DO NOT EXCEED 200 MG IN 24 HOURS.    Dispense:  16 tablet    Refill:  3    Patient has tried Sumatriptan and Rizatriptan     Discussed: To prevent or relieve headaches, try the following: Cool Compress. Lie down and place a cool compress on your head.  Avoid headache triggers. If certain foods or odors seem to have triggered your migraines in the past, avoid them. A headache diary might  help you identify triggers.  Include physical activity in your daily routine. Try a daily walk or other moderate aerobic exercise.  Manage stress. Find healthy ways to cope with the stressors, such as delegating tasks on your to-do list.  Practice relaxation techniques. Try deep breathing, yoga, massage and visualization.  Eat regularly. Eating regularly scheduled meals and maintaining a healthy diet might help prevent headaches. Also, drink plenty of fluids.  Follow a regular sleep schedule. Sleep deprivation might contribute to headaches Consider biofeedback. With this mind-body technique, you learn to control certain bodily functions -- such as muscle tension, heart rate and blood pressure -- to prevent headaches or reduce headache pain.    Proceed to emergency room if you experience new or worsening symptoms or symptoms do not resolve, if you have new neurologic symptoms or if headache is severe, or for any concerning symptom.   Provided education and documentation from American headache Society toolbox including articles on: chronic migraine medication overuse headache, chronic migraines, prevention of migraines, behavioral and other nonpharmacologic treatments for headache.   Cc: McLean-Scocuzza, Olivia Mackie *,  McLean-Scocuzza, Nino Glow, MD  Sarina Ill, MD  [facility] [address] [place], [place] [address]  Phone [phone] Fax [phone]

## 2022-04-24 NOTE — Patient Instructions (Signed)
Chronic migraines: refill emgality Acute management: Roselyn MeierRoselyn Meier helps. Failed sumatriptan, rizatriptan Please take one tablet at the onset of your headache. If it does not improve the symptoms please take one additional tablet.  Sent note to staff to call for follow up  Ubrogepant Tablets What is this medication? UBROGEPANT (ue BROE je pant) treats migraines. It works by blocking a substance in the body that causes migraines. It is not used to prevent migraines. This medicine may be used for other purposes; ask your health care provider or pharmacist if you have questions. COMMON BRAND NAME(S): [name] What should I tell my care team before I take this medication? They need to know if you have any of these conditions: Kidney disease Liver disease An unusual or allergic reaction to ubrogepant, other medications, foods, dyes, or preservatives Pregnant or trying to get pregnant Breast-feeding How should I use this medication? Take this medication by mouth with a glass of water. Take it as directed on the prescription label. You can take it with or without food. If it upsets your stomach, take it with food. Keep taking it unless your care team tells you to stop. Talk to your care team about the use of this medication in children. Special care may be needed. Overdosage: If you think you have taken too much of this medicine contact a poison control center or emergency room at once. NOTE: This medicine is only for you. Do not share this medicine with others. What if I miss a dose? This does not apply. This medication is not for regular use. What may interact with this medication? Do not take this medication with any of the following: Adagrasib Ceritinib Certain antibiotics, such as chloramphenicol, clarithromycin, telithromycin Certain antivirals for HIV, such as atazanavir, cobicistat, darunavir, delavirdine, fosamprenavir, indinavir, ritonavir Certain medications for fungal  infections, such as itraconazole, ketoconazole, posaconazole, voriconazole Conivaptan Grapefruit Idelalisib Mifepristone Nefazodone Ribociclib This medication may also interact with the following: Carvedilol Certain medications for seizures, such as phenobarbital, phenytoin Ciprofloxacin Cyclosporine Eltrombopag Fluconazole Fluvoxamine Quinidine Rifampin St. John's wort Verapamil This list may not describe all possible interactions. Give your health care provider a list of all the medicines, herbs, non-prescription drugs, or dietary supplements you use. Also tell them if you smoke, drink alcohol, or use illegal drugs. Some items may interact with your medicine. What should I watch for while using this medication? Visit your care team for regular checks on your progress. Tell your care team if your symptoms do not start to get better or if they get worse. Your mouth may get dry. Chewing sugarless gum or sucking hard candy and drinking plenty of water may help. Contact your care team if the problem does not go away or is severe. What side effects may I notice from receiving this medication? Side effects that you should report to your care team as soon as possible: Allergic reactions--skin rash, itching, hives, swelling of the face, lips, tongue, or throat Side effects that usually do not require medical attention (report to your care team if they continue or are bothersome): Drowsiness Dry mouth Fatigue Nausea This list may not describe all possible side effects. Call your doctor for medical advice about side effects. You may report side effects to FDA at 1-800-FDA-1088. Where should I keep my medication? Keep out of the reach of children and pets. Store between 15 and 30 degrees C (59 and 86 degrees F). Get rid of any unused medication after the expiration date. To get rid of  medications that are no longer needed or have expired: Take the medication to a medication take-back  program. Check with your pharmacy or law enforcement to find a location. If you cannot return the medication, check the label or package insert to see if the medication should be thrown out in the garbage or flushed down the toilet. If you are not sure, ask your care team. If it is safe to put it in the trash, pour the medication out of the container. Mix the medication with cat litter, dirt, coffee grounds, or other unwanted substance. Seal the mixture in a bag or container. Put it in the trash. NOTE: This sheet is a summary. It may not cover all possible information. If you have questions about this medicine, talk to your doctor, pharmacist, or health care provider.  2023 Elsevier/Gold Standard (2021-08-16 00:00:00) Galcanezumab Injection What is this medication? GALCANEZUMAB (gal ka NEZ ue mab) prevents migraines. It works by blocking a substance in the body that causes migraines. It may also be used to treat cluster headaches. It is a monoclonal antibody. This medicine may be used for other purposes; ask your health care provider or pharmacist if you have questions. COMMON BRAND NAME(S): Emgality What should I tell my care team before I take this medication? They need to know if you have any of these conditions: An unusual or allergic reaction to galcanezumab, other medications, foods, dyes, or preservatives Pregnant or trying to get pregnant Breast-feeding How should I use this medication? This medication is injected under the skin. You will be taught how to prepare and give it. Take it as directed on the prescription label. Keep taking it unless your care team tells you to stop. It is important that you put your used needles and syringes in a special sharps container. Do not put them in a trash can. If you do not have a sharps container, call your pharmacist or care team to get one. Talk to your care team about the use of this medication in children. Special care may be needed. Overdosage:  If you think you have taken too much of this medicine contact a poison control center or emergency room at once. NOTE: This medicine is only for you. Do not share this medicine with others. What if I miss a dose? If you miss a dose, take it as soon as you can. If it is almost time for your next dose, take only that dose. Do not take double or extra doses. What may interact with this medication? Interactions are not expected. This list may not describe all possible interactions. Give your health care provider a list of all the medicines, herbs, non-prescription drugs, or dietary supplements you use. Also tell them if you smoke, drink alcohol, or use illegal drugs. Some items may interact with your medicine. What should I watch for while using this medication? Visit your care team for regular checks on your progress. Tell your care team if your symptoms do not start to get better or if they get worse. What side effects may I notice from receiving this medication? Side effects that you should report to your care team as soon as possible: Allergic reactions or angioedema--skin rash, itching or hives, swelling of the face, eyes, lips, tongue, arms, or legs, trouble swallowing or breathing Side effects that usually do not require medical attention (report to your care team if they continue or are bothersome): Pain, redness, or irritation at injection site This list may not describe all possible  side effects. Call your doctor for medical advice about side effects. You may report side effects to FDA at 1-800-FDA-1088. Where should I keep my medication? Keep out of the reach of children and pets. Store in a refrigerator or at room temperature between 20 and 25 degrees C (68 and 77 degrees F). Refrigeration (preferred): Store in the refrigerator. Do not freeze. Keep in the original container until you are ready to take it. Remove the dose from the carton about 30 minutes before it is time for you to use it.  If the dose is not used, it may be stored in original container at room temperature for 7 days. Get rid of any unused medication after the expiration date. Room Temperature: This medication may be stored at room temperature for up to 7 days. Keep it in the original container. Protect from light until time of use. If it is stored at room temperature, get rid of any unused medication after 7 days or after it expires, whichever is first. To get rid of medications that are no longer needed or have expired: Take the medication to a medication take-back program. Check with your pharmacy or law enforcement to find a location. If you cannot return the medication, ask your pharmacist or care team how to get rid of this medication safely. NOTE: This sheet is a summary. It may not cover all possible information. If you have questions about this medicine, talk to your doctor, pharmacist, or health care provider.  2023 Elsevier/Gold Standard (2021-09-24 00:00:00)

## 2022-04-24 NOTE — Telephone Encounter (Signed)
Pt scheduled for video visit with Dr. Jaynee Eagles on 04/27/23 at 3:30pm

## 2022-04-24 NOTE — Telephone Encounter (Signed)
Please call and schedule video follow up in one year dr Jaynee Eagles thanks

## 2022-04-25 ENCOUNTER — Encounter: Payer: Self-pay | Admitting: Internal Medicine

## 2022-04-25 ENCOUNTER — Telehealth: Payer: Self-pay | Admitting: Pulmonary Disease

## 2022-04-25 DIAGNOSIS — G4733 Obstructive sleep apnea (adult) (pediatric): Secondary | ICD-10-CM | POA: Diagnosis not present

## 2022-04-25 NOTE — Telephone Encounter (Signed)
HST 04/24/22 >> AHI 5.2, SpO2 low 83%   Please inform her that her sleep study shows mild obstructive sleep apnea.  Please arrange for ROV with me or NP to discuss treatment options.

## 2022-04-25 NOTE — Telephone Encounter (Signed)
Spoke with patient regarding HST results. They verbalized understanding. Scheduled an appt with TP NP on 05/02/22 to discuss treatment options  No further questions.  Nothing further needed at this time.

## 2022-05-02 ENCOUNTER — Ambulatory Visit: Payer: BC Managed Care – PPO | Admitting: Adult Health

## 2022-05-02 ENCOUNTER — Encounter: Payer: Self-pay | Admitting: Adult Health

## 2022-05-02 VITALS — BP 102/70 | HR 61 | Temp 98.0°F | Ht 65.0 in | Wt 163.8 lb

## 2022-05-02 DIAGNOSIS — G4733 Obstructive sleep apnea (adult) (pediatric): Secondary | ICD-10-CM

## 2022-05-02 NOTE — Assessment & Plan Note (Signed)
Mild obstructive sleep apnea with significant symptom burden.  Patient does not feel that she would be able to use a oral appliance for sleep apnea due to TMJ symptoms.  We will set patient up on auto CPAP 5 to 15 cm H2O.  Patient education was given on sleep apnea  - discussed how weight can impact sleep and risk for sleep disordered breathing - discussed options to assist with weight loss: combination of diet modification, cardiovascular and strength training exercises   - had an extensive discussion regarding the adverse health consequences related to untreated sleep disordered breathing - specifically discussed the risks for hypertension, coronary artery disease, cardiac dysrhythmias, cerebrovascular disease, and diabetes - lifestyle modification discussed   - discussed how sleep disruption can increase risk of accidents, particularly when driving - safe driving practices were discussed   Plan  Patient Instructions  Begin CPAP at bedtime goal is to wear all night long for at least 6 or more hours Healthy sleep regimen Activity as tolerated Do not drive if sleepy Work on healthy weight loss You may like the DreamWear nasal mask Follow-up with Dr. Halford Chessman in 3 months and as needed

## 2022-05-02 NOTE — Progress Notes (Signed)
$'@Patient'B$  ID: Valerie Colon, female    DOB: Jul 04, 1971, 51 y.o.   MRN: [id number]  Chief Complaint  Patient presents with   Follow-up    Referring provider: McLean-Scocuzza, Olivia Mackie *  HPI: 51 year old female seen for sleep consult February 20, 2022 to be evaluated for sleep apnea with headaches and daytime sleepiness.  Previous diagnosis of sleep apnea in the past  TEST/EVENTS :  HST 04/24/22 >> AHI 5.2, SpO2 low 83%  05/02/2022 Follow up ; OSA  Patient returns for a 63-monthfollow-up.  Patient was previously diagnosed with sleep and to 9 she was on see PAP for several years.  She says she had gastric bypass surgery in 2012 lost a significant amount of weight and was able to come off of CPAP.  Over the last couple years she has noticed that she is having more trouble sleeping feeling tired and waking up feeling unrefreshed.  She has snoring and frequent headaches.  Patient was set up for home sleep study that was completed on April 18, 2022, this showed mild sleep apnea with AHI at 5.2 and SPO2 low at 83%.  We discussed her sleep study was in detail.  We treatment is including oral appliance and sleep therapy.  Patient says that she would be unable to use a oral appliance as she had TMJ patient says she has significant symptom burden and wants to go back onto CPAP.  No Known Allergies  Immunization History  Administered Date(s) Administered   Influenza,inj,Quad PF,6+ Mos 06/05/2017, 05/07/2018, 05/06/2019, 05/16/2020   Influenza,inj,quad, With Preservative 06/05/2017   Influenza-Unspecified 04/30/2012, 06/13/2015, 05/01/2021   Moderna Covid-19 Vaccine Bivalent Booster 171yr& up 05/01/2021   Moderna SARS-COV2 Booster Vaccination 07/30/2020   Moderna Sars-Covid-2 Vaccination 11/01/2019, 11/29/2019, 12/02/2019, 07/27/2020   Tdap 01/24/2016, 01/16/2018    Past Medical History:  Diagnosis Date   Acid reflux    Allergic rhinitis    Allergy    Anxiety    COVID-19    09/2020   Heart  murmur    History of hiatal hernia    Kidney stones    Mass of finger    myxoid neurofibroma multiple   Migraine    Ovarian cyst, left     Tobacco History: Social History   Tobacco Use  Smoking Status Never   Passive exposure: Past  Smokeless Tobacco Never   Counseling given: Not Answered   Outpatient Medications Prior to Visit  Medication Sig Dispense Refill   CALCIUM PO Take by mouth.     Galcanezumab-gnlm (EMGALITY) 120 MG/ML SOAJ Inject 120 mg into the skin every 30 (thirty) days. 1 mL 5   Multiple Vitamin (MULTIVITAMIN) tablet Take 1 tablet by mouth daily.     ondansetron (ZOFRAN ODT) 4 MG disintegrating tablet Take 1 tablet (4 mg total) by mouth every 8 (eight) hours as needed for nausea or vomiting. 30 tablet 6   Ubrogepant (UBRELVY) 100 MG TABS Take 100 mg by mouth every 2 (two) hours as needed (migraine.). Take as close to onset of migraine as possible. DO NOT EXCEED 200 MG IN 24 HOURS. 16 tablet 3   No facility-administered medications prior to visit.     Review of Systems:   Constitutional:   No  weight loss, night sweats,  Fevers, chills,  +fatigue, or  lassitude.  HEENT:   No headaches,  Difficulty swallowing,  Tooth/dental problems, or  Sore throat,  No sneezing, itching, ear ache, nasal congestion, post nasal drip,   CV:  No chest pain,  Orthopnea, PND, swelling in lower extremities, anasarca, dizziness, palpitations, syncope.   GI  No heartburn, indigestion, abdominal pain, nausea, vomiting, diarrhea, change in bowel habits, loss of appetite, bloody stools.   Resp: No shortness of breath with exertion or at rest.  No excess mucus, no productive cough,  No non-productive cough,  No coughing up of blood.  No change in color of mucus.  No wheezing.  No chest wall deformity  Skin: no rash or lesions.  GU: no dysuria, change in color of urine, no urgency or frequency.  No flank pain, no hematuria   MS:  No joint pain or swelling.  No  decreased range of motion.  No back pain.    Physical Exam  BP 102/70 (BP Location: Left Arm)   Pulse 61   Temp 98 F (36.7 C) (Oral)   Ht '5\' 5"'$  (1.651 m)   Wt 163 lb 12.8 oz (74.3 kg)   LMP  (LMP Unknown) Comment: neg upt  SpO2 100%   BMI 27.26 kg/m   GEN: A/Ox3; pleasant , NAD, well nourished    HEENT:  [place]/AT,   NOSE-clear, THROAT-clear, no lesions, no postnasal drip or exudate noted.  Class III MP airway  NECK:  Supple w/ fair ROM; no JVD; normal carotid impulses w/o bruits; no thyromegaly or nodules palpated; no lymphadenopathy.    RESP  Clear  P & A; w/o, wheezes/ rales/ or rhonchi. no accessory muscle use, no dullness to percussion  CARD:  RRR, no m/r/g, no peripheral edema, pulses intact, no cyanosis or clubbing.  GI:   Soft & nt; nml bowel sounds; no organomegaly or masses detected.   Musco: Warm bil, no deformities or joint swelling noted.   Neuro: alert, no focal deficits noted.    Skin: Warm, no lesions or rashes    Lab Results:  CBC  No results found for: "BNP"  ProBNP No results found for: "PROBNP"  Imaging: No results found.        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  Patient does not feel that she would be able to use a oral appliance for sleep apnea due to TMJ symptoms.  We will set patient up on auto CPAP 5 to 15 cm H2O.  Patient education was given on sleep apnea  - discussed how weight can impact sleep and risk for sleep disordered breathing - discussed options to assist with weight loss: combination of diet modification, cardiovascular and strength training exercises   - had an extensive discussion regarding the adverse health consequences related to untreated sleep disordered breathing - specifically discussed the risks for hypertension, coronary artery disease, cardiac  dysrhythmias, cerebrovascular disease, and diabetes - lifestyle modification discussed   - discussed how sleep disruption can increase risk of accidents, particularly when driving - safe driving practices were discussed   Plan  Patient Instructions  Begin CPAP at bedtime goal is to wear all night long for at least 6 or more hours Healthy sleep regimen Activity as tolerated Do not drive if sleepy Work on healthy weight loss You may like the DreamWear nasal mask Follow-up with Dr. Halford Chessman in 3 months and as needed  Rexene Edison, NP 05/02/2022

## 2022-05-02 NOTE — Patient Instructions (Signed)
Begin CPAP at bedtime goal is to wear all night long for at least 6 or more hours Healthy sleep regimen Activity as tolerated Do not drive if sleepy Work on healthy weight loss You may like the DreamWear nasal mask Follow-up with Dr. Halford Chessman in 3 months and as needed

## 2022-05-05 NOTE — Progress Notes (Signed)
Reviewed and agree with assessment/plan.   Chesley Mires, MD [facility] Pulmonary/Critical Care 05/05/2022, 10:40 PM Pager:  [phone]

## 2022-05-22 DIAGNOSIS — G4733 Obstructive sleep apnea (adult) (pediatric): Secondary | ICD-10-CM | POA: Diagnosis not present

## 2022-06-20 ENCOUNTER — Ambulatory Visit
Admission: RE | Admit: 2022-06-20 | Discharge: 2022-06-20 | Disposition: A | Discharge status: Home / Self Care | Payer: BC Managed Care – PPO | Source: Ambulatory Visit | Attending: Internal Medicine | Admitting: Internal Medicine

## 2022-06-20 DIAGNOSIS — Z1231 Encounter for screening mammogram for malignant neoplasm of breast: Secondary | ICD-10-CM | POA: Diagnosis not present

## 2022-06-22 DIAGNOSIS — G4733 Obstructive sleep apnea (adult) (pediatric): Secondary | ICD-10-CM | POA: Diagnosis not present

## 2022-07-05 DIAGNOSIS — G4733 Obstructive sleep apnea (adult) (pediatric): Secondary | ICD-10-CM | POA: Diagnosis not present

## 2022-07-09 ENCOUNTER — Encounter: Payer: Self-pay | Admitting: *Deleted

## 2022-07-09 MED ORDER — EMGALITY 120 MG/ML ~~LOC~~ SOSY: 120.0000 mg | PREFILLED_SYRINGE | SUBCUTANEOUS | 2 refills | Status: DC

## 2022-07-09 NOTE — Telephone Encounter (Addendum)
Spoke to Patient agreed to prefilled emgality syringes Auto injectors are on back order .Will send order to pharmacy this am

## 2022-07-09 NOTE — Telephone Encounter (Signed)
Similar to the auto-syringe, the Emgality syringe can be injected into the stomach, front of the thigh, or back of the arm. The skin should be pinched and the needle should be inserted at a 45 degree angle before it is injected

## 2022-07-21 NOTE — Telephone Encounter (Signed)
Valerie Colon ([name]) Rx #: [id number] Emgality '120MG'$ /ML syringes (migraine    PA Emgality Complete Waiting on approval

## 2022-07-22 DIAGNOSIS — G4733 Obstructive sleep apnea (adult) (pediatric): Secondary | ICD-10-CM | POA: Diagnosis not present

## 2022-07-31 ENCOUNTER — Encounter: Payer: Self-pay | Admitting: Neurology

## 2022-07-31 DIAGNOSIS — G43009 Migraine without aura, not intractable, without status migrainosus: Secondary | ICD-10-CM

## 2022-07-31 MED ORDER — UBRELVY 100 MG PO TABS: 100.0000 mg | ORAL_TABLET | ORAL | 9 refills | Status: DC | PRN

## 2022-07-31 MED ORDER — UBRELVY 100 MG PO TABS: 100.0000 mg | ORAL_TABLET | ORAL | 3 refills | Status: DC | PRN

## 2022-07-31 NOTE — Telephone Encounter (Signed)
Sent refill for Ubrevly this evening

## 2022-07-31 NOTE — Addendum Note (Signed)
Addended by: Skeet Simmer A on: 07/31/2022 04:48 PM   Modules accepted: Orders

## 2022-08-01 ENCOUNTER — Encounter: Payer: Self-pay | Admitting: Adult Health

## 2022-08-01 ENCOUNTER — Ambulatory Visit: Payer: BC Managed Care – PPO | Admitting: Adult Health

## 2022-08-01 VITALS — BP 122/62 | HR 86 | Temp 98.0°F | Ht 66.0 in | Wt 163.0 lb

## 2022-08-01 DIAGNOSIS — G4733 Obstructive sleep apnea (adult) (pediatric): Secondary | ICD-10-CM | POA: Diagnosis not present

## 2022-08-01 NOTE — Progress Notes (Signed)
$'@Patient'S$  ID: Valerie Colon, female    DOB: 11-Oct-1970, 51 y.o.   MRN: [id number]  Chief Complaint  Patient presents with   Follow-up    Referring provider: McLean-Scocuzza, Olivia Mackie *  HPI: 51 year old female seen for sleep consult February 20, 2022 to establish for sleep apnea. Previous diagnosis of sleep apnea but was able to come off of CPAP after gastric bypass with extensive weight loss.  TEST/EVENTS :  HST 04/24/22 >> AHI 5.2, SpO2 low 83%   08/01/2022 Follow up ; OSA Patient returns for a 66-monthfollow-up.  Patient was seen last visit for mild sleep apnea.  Patient previously been diagnosed with sleep apnea in the past was on CPAP for a long period of time.  She came off the CPAP after losing a significant amount of weight with gastric bypass.  Patient had complained of snoring, daytime sleepiness and frequent headaches.  Repeat sleep study done on April 24, 2022 showed mild sleep apnea with AHI 5.2/hour and SpO2 low at 83%.  She declined oral appliance as she has had TMJ issues in the past.  She wanted to start back on CPAP.  Patient was restarted on CPAP.  Says she feels so much better.  She says her symptoms have improved significantly.  Feels that she is really benefited from restarting her CPAP.  CPAP download shows excellent compliance with 100% compliance.  Daily average usage at 6 hours.  AHI 0.4/hour.  Daily average pressure at 10.5/hour.  Patient is using a nasal mask.   No Known Allergies  Immunization History  Administered Date(s) Administered   Influenza,inj,Quad PF,6+ Mos 06/05/2017, 05/07/2018, 05/06/2019, 05/16/2020   Influenza,inj,quad, With Preservative 06/05/2017   Influenza-Unspecified 04/30/2012, 06/13/2015, 05/01/2021, 07/18/2022   Moderna Covid-19 Vaccine Bivalent Booster 169yr& up 05/01/2021   Moderna SARS-COV2 Booster Vaccination 07/30/2020   Moderna Sars-Covid-2 Vaccination 11/01/2019, 11/29/2019, 12/02/2019, 07/27/2020   Tdap 01/24/2016, 01/16/2018     Past Medical History:  Diagnosis Date   Acid reflux    Allergic rhinitis    Allergy    Anxiety    COVID-19    09/2020   Heart murmur    History of hiatal hernia    Kidney stones    Mass of finger    myxoid neurofibroma multiple   Migraine    Ovarian cyst, left     Tobacco History: Social History   Tobacco Use  Smoking Status Never   Passive exposure: Past  Smokeless Tobacco Never   Counseling given: Not Answered   Outpatient Medications Prior to Visit  Medication Sig Dispense Refill   CALCIUM PO Take by mouth.     Galcanezumab-gnlm (EMGALITY) 120 MG/ML SOAJ Inject 120 mg into the skin every 30 (thirty) days. 1 mL 5   Galcanezumab-gnlm (EMGALITY) 120 MG/ML SOSY Inject 120 mg into the skin every 30 (thirty) days. 1 mL 2   Multiple Vitamin (MULTIVITAMIN) tablet Take 1 tablet by mouth daily.     ondansetron (ZOFRAN ODT) 4 MG disintegrating tablet Take 1 tablet (4 mg total) by mouth every 8 (eight) hours as needed for nausea or vomiting. 30 tablet 6   Ubrogepant (UBRELVY) 100 MG TABS Take 100 mg by mouth every 2 (two) hours as needed (migraine.). Take as close to onset of migraine as possible. DO NOT EXCEED 200 MG IN 24 HOURS. 16 tablet 9   No facility-administered medications prior to visit.     Review of Systems:   Constitutional:   No  weight loss, night sweats,  Fevers, chills, fatigue, or  lassitude.  HEENT:   No headaches,  Difficulty swallowing,  Tooth/dental problems, or  Sore throat,                No sneezing, itching, ear ache, nasal congestion, post nasal drip,   CV:  No chest pain,  Orthopnea, PND, swelling in lower extremities, anasarca, dizziness, palpitations, syncope.   GI  No heartburn, indigestion, abdominal pain, nausea, vomiting, diarrhea, change in bowel habits, loss of appetite, bloody stools.   Resp: No shortness of breath with exertion or at rest.  No excess mucus, no productive cough,  No non-productive cough,  No coughing up of blood.  No  change in color of mucus.  No wheezing.  No chest wall deformity  Skin: no rash or lesions.  GU: no dysuria, change in color of urine, no urgency or frequency.  No flank pain, no hematuria   MS:  No joint pain or swelling.  No decreased range of motion.  No back pain.    Physical Exam  BP 122/62 (BP Location: Left Arm, Patient Position: Sitting, Cuff Size: Normal)   Pulse 86   Temp 98 F (36.7 C) (Oral)   Ht '5\' 6"'$  (1.676 m)   Wt 163 lb (73.9 kg)   LMP  (LMP Unknown) Comment: neg upt  SpO2 99%   BMI 26.31 kg/m   GEN: A/Ox3; pleasant , NAD, well nourished    HEENT:  [place]/AT,   NOSE-clear, THROAT-clear, no lesions, no postnasal drip or exudate noted.  Class III MP airway  NECK:  Supple w/ fair ROM; no JVD; normal carotid impulses w/o bruits; no thyromegaly or nodules palpated; no lymphadenopathy.    RESP  Clear  P & A; w/o, wheezes/ rales/ or rhonchi. no accessory muscle use, no dullness to percussion  CARD:  RRR, no m/r/g, no peripheral edema, pulses intact, no cyanosis or clubbing.  GI:   Soft & nt; nml bowel sounds; no organomegaly or masses detected.   Musco: Warm bil, no deformities or joint swelling noted.   Neuro: alert, no focal deficits noted.    Skin: Warm, no lesions or rashes    Lab Results:     BNP No results found for: "BNP"  ProBNP No results found for: "PROBNP"  Imaging: No results found.        No data to display          No results found for: "NITRICOXIDE"      Assessment & Plan:   OSA (obstructive sleep apnea) Mild obstructive sleep apnea with significant perceived benefit on CPAP.  Continue on current settings.  She has excellent control and compliance.  Plan Patient Instructions  Continue on CPAP at bedtime goal is to wear all night long for at least 6 or more hours Healthy sleep regimen Activity as tolerated Do not drive if sleepy Work on healthy weight loss Follow-up with Dr. Halford Chessman or Jamarion [place] NP in 6 months and as  needed      Rexene Edison, NP 08/01/2022

## 2022-08-01 NOTE — Patient Instructions (Signed)
Continue on CPAP at bedtime goal is to wear all night long for at least 6 or more hours Healthy sleep regimen Activity as tolerated Do not drive if sleepy Work on healthy weight loss Follow-up with Dr. Halford Chessman or Zamora Colton NP in 6 months and as needed

## 2022-08-01 NOTE — Assessment & Plan Note (Signed)
Mild obstructive sleep apnea with significant perceived benefit on CPAP.  Continue on current settings.  She has excellent control and compliance.  Plan Patient Instructions  Continue on CPAP at bedtime goal is to wear all night long for at least 6 or more hours Healthy sleep regimen Activity as tolerated Do not drive if sleepy Work on healthy weight loss Follow-up with Dr. Halford Chessman or Taliana Mersereau NP in 6 months and as needed

## 2022-08-04 ENCOUNTER — Other Ambulatory Visit: Payer: Self-pay | Admitting: *Deleted

## 2022-08-04 DIAGNOSIS — G43009 Migraine without aura, not intractable, without status migrainosus: Secondary | ICD-10-CM

## 2022-08-04 DIAGNOSIS — G4733 Obstructive sleep apnea (adult) (pediatric): Secondary | ICD-10-CM | POA: Diagnosis not present

## 2022-08-04 MED ORDER — UBRELVY 100 MG PO TABS: 100.0000 mg | ORAL_TABLET | ORAL | 9 refills | Status: DC | PRN

## 2022-08-05 ENCOUNTER — Encounter: Payer: Self-pay | Admitting: Family Medicine

## 2022-08-05 ENCOUNTER — Ambulatory Visit: Payer: BC Managed Care – PPO | Admitting: Family Medicine

## 2022-08-05 ENCOUNTER — Encounter: Payer: BC Managed Care – PPO | Admitting: Family Medicine

## 2022-08-05 VITALS — BP 122/84 | HR 67 | Temp 97.6°F | Ht 66.0 in | Wt 165.8 lb

## 2022-08-05 DIAGNOSIS — R7303 Prediabetes: Secondary | ICD-10-CM

## 2022-08-05 DIAGNOSIS — G4733 Obstructive sleep apnea (adult) (pediatric): Secondary | ICD-10-CM

## 2022-08-05 DIAGNOSIS — Z9884 Bariatric surgery status: Secondary | ICD-10-CM | POA: Diagnosis not present

## 2022-08-05 DIAGNOSIS — N951 Menopausal and female climacteric states: Secondary | ICD-10-CM | POA: Diagnosis not present

## 2022-08-05 DIAGNOSIS — E559 Vitamin D deficiency, unspecified: Secondary | ICD-10-CM

## 2022-08-05 DIAGNOSIS — R946 Abnormal results of thyroid function studies: Secondary | ICD-10-CM | POA: Diagnosis not present

## 2022-08-05 DIAGNOSIS — G43709 Chronic migraine without aura, not intractable, without status migrainosus: Secondary | ICD-10-CM

## 2022-08-05 DIAGNOSIS — Z0000 Encounter for general adult medical examination without abnormal findings: Secondary | ICD-10-CM

## 2022-08-05 DIAGNOSIS — Z13220 Encounter for screening for lipoid disorders: Secondary | ICD-10-CM

## 2022-08-05 NOTE — Patient Instructions (Signed)
It was a pleasure meeting you today. Thank you for allowing me to take part in your health care.  Our goals for today as we discussed include:  We will get some labs today.  If they are abnormal or we need to do something about them, I will call you.  If they are normal, I will send you a message on MyChart (if it is active) or a letter in the mail.  If you don't hear from [place] in 2 weeks, please call the office at the number below.   Please schedule an appointment for fasting lab work 1 week prior to next visit  If you have any questions or concerns, please do not hesitate to call the office at [phone].  I look forward to our next visit and until then take care and stay safe.  Regards,   Carollee Leitz, MD   [facility]

## 2022-08-05 NOTE — Progress Notes (Signed)
SUBJECTIVE:   Chief Complaint  Patient presents with   Establish Care    Transfer of Care   HPI Patient presents to clinic to transfer care  No acute concerns today.  Menopausal symptoms Patient reports symptoms of hot flashes, vaginal dryness.  Does not want to take estrogen therapy.  Is inquiring about Veozah, nonhormonal medication for menopause. History of hysterectomy for fibroids,  with left ovary intact.  Follows with OBGYN.  Weight management Currently on Tirzepatide Plus 2.5 mg with Vit B12 500 mcg weekly and Metformin 500 mg daily for maintenance weight loss therapy.  Follows with Lynelle Doctor, NP at [facility] without auras Doing well.  Follows with neurology.  Currently takes [place] and Zofran.  OSA Doing well.  Compliant with CPAP.  [place] pulmonology.    PERTINENT PMH / PSH: OSA on CPAP GERD Migraines CKD stage III a Hyperthyroidism Gastric Bypass  OBJECTIVE:  BP 122/84   Pulse 67   Temp 97.6 F (36.4 C)   Ht '5\' 6"'$  (1.676 m)   Wt 165 lb 12.8 oz (75.2 kg)   LMP  (LMP Unknown) Comment: neg upt  SpO2 99%   BMI 26.76 kg/m    Physical Exam Vitals reviewed.  Constitutional:      General: She is not in acute distress.    Appearance: She is not ill-appearing.  HENT:     Head: Normocephalic.     Nose: Nose normal.  Eyes:     Conjunctiva/sclera: Conjunctivae normal.  Cardiovascular:     Rate and Rhythm: Normal rate and regular rhythm.     Heart sounds: Normal heart sounds.  Pulmonary:     Effort: Pulmonary effort is normal.     Breath sounds: Normal breath sounds.  Abdominal:     General: Abdomen is flat. Bowel sounds are normal.     Palpations: Abdomen is soft.  Musculoskeletal:        General: Normal range of motion.     Cervical back: Normal range of motion.  Neurological:     Mental Status: She is alert and oriented to person, place, and time. Mental status is at baseline.  Psychiatric:        Mood and Affect:  Mood normal.        Behavior: Behavior normal.        Thought Content: Thought content normal.        Judgment: Judgment normal.     ASSESSMENT/PLAN:  Menopausal symptoms Assessment & Plan: Chronic.  Stable.  Hot flashes, vaginal dryness.  Interested in starting new nonhormonal medication Veozah.  Not interested in hormonal therapy. Will look into medication and if no contraindications discussed with patient at next visit.    Vitamin D deficiency Assessment & Plan: Chronic.  Not currently on supplementation.  History of TAH with preserved left ovary. Postmenopausal Check vitamin D levels Recommend vitamin D 800 international unit daily  Orders: -     VITAMIN D 25 Hydroxy (Vit-D Deficiency, Fractures); Future  Abnormal thyroid function test Assessment & Plan: Chronic.  Stable.  Asymptomatic Recent TSH within normal limits.  Repeat TSH annually   Orders: -     TSH; Future -     Urinalysis, Routine w reflex microscopic; Future  H/O gastric bypass -     CBC with Differential/Platelet; Future -     Vitamin B12; Future  Lipid screening -     Lipid panel; Future  Prediabetes Assessment & Plan: Chronic.  Stable.  Currently on metformin and Mounjaro plus. History of gastric bypass Follows with being treated for weight loss management. Repeat A1c at next visit     Orders: -     Comprehensive metabolic panel; Future -     Hemoglobin A1c; Future  OSA (obstructive sleep apnea) Assessment & Plan: Chronic.  Stable.  Follows with pulmonology. Continue CPAP   Chronic migraine without aura without status migrainosus, not intractable Assessment & Plan: Chronic.  Stable. Continue Emgality 120 mg injectable q. monthly Continue Ubrelvy 100 mg every 2 hours, max dose 200 mg in 24 hours Continue Zofran 4 mg every 8 hours as needed Continue to follow with neurology as scheduled.     EGD 2023, chronic gastritis Colonoscopy up-to-date PDMP reviewed  Return in about  3 months (around 11/04/2022) for annual visit with fasting labs 1 week prior.  Carollee Leitz, MD

## 2022-08-10 NOTE — Progress Notes (Signed)
Reviewed and agree with assessment/plan.   Athanasios Heldman, MD [place] Pulmonary/Critical Care 08/10/2022, 7:19 PM Pager:  [phone]  

## 2022-08-18 ENCOUNTER — Encounter: Payer: Self-pay | Admitting: Family Medicine

## 2022-08-19 NOTE — Assessment & Plan Note (Signed)
Chronic.  Stable.  Follows with pulmonology. Continue CPAP

## 2022-08-19 NOTE — Assessment & Plan Note (Signed)
Chronic.  Stable.  Asymptomatic Recent TSH within normal limits.  Repeat TSH annually

## 2022-08-20 DIAGNOSIS — Z13220 Encounter for screening for lipoid disorders: Secondary | ICD-10-CM | POA: Insufficient documentation

## 2022-08-22 ENCOUNTER — Encounter: Payer: Self-pay | Admitting: Family Medicine

## 2022-08-22 DIAGNOSIS — N951 Menopausal and female climacteric states: Secondary | ICD-10-CM | POA: Insufficient documentation

## 2022-08-22 DIAGNOSIS — G4733 Obstructive sleep apnea (adult) (pediatric): Secondary | ICD-10-CM | POA: Diagnosis not present

## 2022-08-22 NOTE — Assessment & Plan Note (Signed)
Chronic.  Stable. Continue Emgality 120 mg injectable q. monthly Continue Ubrelvy 100 mg every 2 hours, max dose 200 mg in 24 hours Continue Zofran 4 mg every 8 hours as needed Continue to follow with neurology as scheduled.

## 2022-08-22 NOTE — Assessment & Plan Note (Signed)
Chronic.  Not currently on supplementation.  History of TAH with preserved left ovary. Postmenopausal Check vitamin D levels Recommend vitamin D 800 international unit daily

## 2022-08-22 NOTE — Assessment & Plan Note (Signed)
Chronic.  Stable.  Hot flashes, vaginal dryness.  Interested in starting new nonhormonal medication Veozah.  Not interested in hormonal therapy. Will look into medication and if no contraindications discussed with patient at next visit.

## 2022-08-22 NOTE — Assessment & Plan Note (Addendum)
Chronic.  Stable.  Currently on metformin and Mounjaro plus. History of gastric bypass Follows with being treated for weight loss management. Repeat A1c at next visit

## 2022-09-04 DIAGNOSIS — G4733 Obstructive sleep apnea (adult) (pediatric): Secondary | ICD-10-CM | POA: Diagnosis not present

## 2022-09-12 DIAGNOSIS — R748 Abnormal levels of other serum enzymes: Secondary | ICD-10-CM | POA: Diagnosis not present

## 2022-09-18 ENCOUNTER — Encounter: Payer: Self-pay | Admitting: Neurology

## 2022-09-18 ENCOUNTER — Other Ambulatory Visit: Payer: Self-pay | Admitting: Neurology

## 2022-09-18 DIAGNOSIS — G43009 Migraine without aura, not intractable, without status migrainosus: Secondary | ICD-10-CM

## 2022-09-22 DIAGNOSIS — G4733 Obstructive sleep apnea (adult) (pediatric): Secondary | ICD-10-CM | POA: Diagnosis not present

## 2022-10-03 DIAGNOSIS — G4733 Obstructive sleep apnea (adult) (pediatric): Secondary | ICD-10-CM | POA: Diagnosis not present

## 2022-10-05 DIAGNOSIS — G4733 Obstructive sleep apnea (adult) (pediatric): Secondary | ICD-10-CM | POA: Diagnosis not present

## 2022-10-21 DIAGNOSIS — G4733 Obstructive sleep apnea (adult) (pediatric): Secondary | ICD-10-CM | POA: Diagnosis not present

## 2022-10-31 ENCOUNTER — Other Ambulatory Visit (INDEPENDENT_AMBULATORY_CARE_PROVIDER_SITE_OTHER): Payer: BC Managed Care – PPO

## 2022-10-31 DIAGNOSIS — Z9884 Bariatric surgery status: Secondary | ICD-10-CM

## 2022-10-31 DIAGNOSIS — Z13220 Encounter for screening for lipoid disorders: Secondary | ICD-10-CM | POA: Diagnosis not present

## 2022-10-31 DIAGNOSIS — R946 Abnormal results of thyroid function studies: Secondary | ICD-10-CM

## 2022-10-31 DIAGNOSIS — R7303 Prediabetes: Secondary | ICD-10-CM

## 2022-10-31 DIAGNOSIS — E559 Vitamin D deficiency, unspecified: Secondary | ICD-10-CM | POA: Diagnosis not present

## 2022-10-31 LAB — COMPREHENSIVE METABOLIC PANEL
ALT: 20 U/L (ref 0–35)
AST: 23 U/L (ref 0–37)
Albumin: 3.6 g/dL (ref 3.5–5.2)
Alkaline Phosphatase: 68 U/L (ref 39–117)
BUN: 17 mg/dL (ref 6–23)
CO2: 26 mEq/L (ref 19–32)
Calcium: 9.2 mg/dL (ref 8.4–10.5)
Chloride: 104 mEq/L (ref 96–112)
Creatinine, Ser: 1.01 mg/dL (ref 0.40–1.20)
GFR: 64.64 mL/min (ref >60.00)
Glucose, Bld: 80 mg/dL (ref 70–99)
Potassium: 4.3 mEq/L (ref 3.5–5.1)
Sodium: 142 mEq/L (ref 135–145)
Total Bilirubin: 0.3 mg/dL (ref 0.2–1.2)
Total Protein: 6.9 g/dL (ref 6.0–8.3)

## 2022-10-31 LAB — VITAMIN D 25 HYDROXY (VIT D DEFICIENCY, FRACTURES): VITD: 31.65 ng/mL (ref 30.00–100.00)

## 2022-10-31 LAB — LIPID PANEL
Cholesterol: 177 mg/dL (ref 0–200)
HDL: 84.7 mg/dL (ref >39.00)
LDL Cholesterol: 82 mg/dL (ref 0–99)
NonHDL: 92.72
Total CHOL/HDL Ratio: 2
Triglycerides: 56 mg/dL (ref 0.0–149.0)
VLDL: 11.2 mg/dL (ref 0.0–40.0)

## 2022-10-31 LAB — URINALYSIS, ROUTINE W REFLEX MICROSCOPIC
Bilirubin Urine: NEGATIVE
Hgb urine dipstick: NEGATIVE
Ketones, ur: NEGATIVE
Nitrite: NEGATIVE
Specific Gravity, Urine: 1.02 (ref 1.000–1.030)
Total Protein, Urine: NEGATIVE
Urine Glucose: NEGATIVE
Urobilinogen, UA: 0.2 (ref 0.0–1.0)
pH: 6.5 (ref 5.0–8.0)

## 2022-10-31 LAB — CBC WITH DIFFERENTIAL/PLATELET
Basophils Absolute: 0 10*3/uL (ref 0.0–0.1)
Basophils Relative: 0.5 % (ref 0.0–3.0)
Eosinophils Absolute: 0.1 10*3/uL (ref 0.0–0.7)
Eosinophils Relative: 2.4 % (ref 0.0–5.0)
HCT: 36.1 % (ref 36.0–46.0)
Hemoglobin: 11.8 g/dL — ABNORMAL LOW (ref 12.0–15.0)
Lymphocytes Relative: 43.3 % (ref 12.0–46.0)
Lymphs Abs: 2.1 10*3/uL (ref 0.7–4.0)
MCHC: 32.8 g/dL (ref 30.0–36.0)
MCV: 92.4 fl (ref 78.0–100.0)
Monocytes Absolute: 0.3 10*3/uL (ref 0.1–1.0)
Monocytes Relative: 6.9 % (ref 3.0–12.0)
Neutro Abs: 2.3 10*3/uL (ref 1.4–7.7)
Neutrophils Relative %: 46.9 % (ref 43.0–77.0)
Platelets: 268 10*3/uL (ref 150.0–400.0)
RBC: 3.9 Mil/uL (ref 3.87–5.11)
RDW: 14.1 % (ref 11.5–15.5)
WBC: 4.8 10*3/uL (ref 4.0–10.5)

## 2022-10-31 LAB — HEMOGLOBIN A1C: Hgb A1c MFr Bld: 6 % (ref 4.6–6.5)

## 2022-10-31 LAB — VITAMIN B12: Vitamin B-12: >1500 pg/mL — ABNORMAL HIGH (ref 211–911)

## 2022-10-31 LAB — TSH: TSH: 1.64 u[IU]/mL (ref 0.35–5.50)

## 2022-11-01 DIAGNOSIS — G4733 Obstructive sleep apnea (adult) (pediatric): Secondary | ICD-10-CM | POA: Diagnosis not present

## 2022-11-07 DIAGNOSIS — K912 Postsurgical malabsorption, not elsewhere classified: Secondary | ICD-10-CM | POA: Diagnosis not present

## 2022-11-12 ENCOUNTER — Encounter: Payer: BC Managed Care – PPO | Admitting: Family Medicine

## 2022-11-21 DIAGNOSIS — G4733 Obstructive sleep apnea (adult) (pediatric): Secondary | ICD-10-CM | POA: Diagnosis not present

## 2022-11-26 ENCOUNTER — Ambulatory Visit (INDEPENDENT_AMBULATORY_CARE_PROVIDER_SITE_OTHER): Payer: BC Managed Care – PPO | Admitting: Family Medicine

## 2022-11-26 ENCOUNTER — Encounter: Payer: Self-pay | Admitting: Family Medicine

## 2022-11-26 VITALS — BP 105/78 | HR 61 | Temp 98.5°F | Ht 66.0 in | Wt 169.6 lb

## 2022-11-26 DIAGNOSIS — G4733 Obstructive sleep apnea (adult) (pediatric): Secondary | ICD-10-CM

## 2022-11-26 DIAGNOSIS — Z9884 Bariatric surgery status: Secondary | ICD-10-CM

## 2022-11-26 DIAGNOSIS — N951 Menopausal and female climacteric states: Secondary | ICD-10-CM | POA: Diagnosis not present

## 2022-11-26 DIAGNOSIS — E559 Vitamin D deficiency, unspecified: Secondary | ICD-10-CM

## 2022-11-26 DIAGNOSIS — R5382 Chronic fatigue, unspecified: Secondary | ICD-10-CM

## 2022-11-26 DIAGNOSIS — E611 Iron deficiency: Secondary | ICD-10-CM | POA: Diagnosis not present

## 2022-11-26 DIAGNOSIS — R5383 Other fatigue: Secondary | ICD-10-CM

## 2022-11-26 NOTE — Patient Instructions (Signed)
It was a pleasure meeting you today. Thank you for allowing me to take part in your health care.  Our goals for today as we discussed include:  Will check iron levels today  Recommend you journal food and beverage intake to for the next couple of weeks to ensure that your caloric intake is adequate  Increase water intake to 64 oz daily  Follow up with pulmonology for evaluation of CPAP and possible adjustment.   If you have any questions or concerns, please do not hesitate to call the office at [phone].  I look forward to our next visit and until then take care and stay safe.  Regards,   Dana Allan, MD   [facility]

## 2022-11-26 NOTE — Progress Notes (Signed)
SUBJECTIVE:   Chief Complaint  Patient presents with   Annual Exam    FATIGUE/SLEEP ISSUES/ daytime sleepingl ess/ menopause/urine symptoms. Hemorrhoid pain   HPI Patient presents to clinic with multiple concerns.  Annual physical deferred given multiple concerns.  Patient agreeable to continue with visit.  Fatigue.   Reports excessive daytime fatigue.  Has CPAP but not followed up with adjustments.  Recent labs reassuring.  Ferritin had not been checked at that time.  She wakes up at 4 AM, works out at [employer] then work at home.  In bed by 8 PM.  Still feels fatigued during the day.  Nutrition appears healthy however does not appear a lot of iron has been implemented.  Does not have monthly cycles given history of total abdominal hysterectomy.  Menopause/weight maintenance/elevated B12 level At previous visit had discussed initiation of NK 3 antagonist.  Information provided to patient and patient reports she has started Vezoah 45 mg daily.  Was prescribed by NP at [facility].  Has had some improvement in symptoms however continues to have fatigue.  NP is being treated also manages weight loss medication.  Metformin 500 twice daily, combination of tirzepatide with vitamin B12.  She is currently waiting for stepdown to be initiated.  Patient reports she was also taking additional B12 supplements along with vitamin D and K supplements.  She also takes multivitamins daily.  Her recent B12 levels have been elevated.  Recommended discontinuing B12 supplementation and can repeat in 12 months.  OSA Has CPAP and uses nightly.  Has not followed up with pulmonology for adjustments in machine.  Possibly attributing to excessive fatigue.  PERTINENT PMH / PSH: OSA compliant with CPAP GERD Migraines CKD stage IIIa Hypothyroidism History of gastric bypass Menopausal   OBJECTIVE:  BP 105/78   Pulse 61   Temp 98.5 F (36.9 C) (Oral)   Ht  (1.676 m)   Wt 169 lb 9.6 oz (76.9 kg)   LMP   (LMP Unknown) Comment: neg upt  SpO2 99%   BMI 27.37 kg/m    Physical Exam Vitals reviewed.  Constitutional:      General: She is not in acute distress.    Appearance: Normal appearance. She is normal weight. She is not ill-appearing, toxic-appearing or diaphoretic.  Eyes:     General:        Right eye: No discharge.        Left eye: No discharge.     Conjunctiva/sclera: Conjunctivae normal.  Cardiovascular:     Rate and Rhythm: Normal rate and regular rhythm.     Heart sounds: Normal heart sounds.  Pulmonary:     Effort: Pulmonary effort is normal.     Breath sounds: Normal breath sounds.  Abdominal:     General: Bowel sounds are normal.  Musculoskeletal:        General: Normal range of motion.  Skin:    General: Skin is warm and dry.  Neurological:     General: No focal deficit present.     Mental Status: She is alert and oriented to person, place, and time. Mental status is at baseline.  Psychiatric:        Mood and Affect: Mood normal.        Behavior: Behavior normal.        Thought Content: Thought content normal.        Judgment: Judgment normal.     ASSESSMENT/PLAN:  Other fatigue Assessment & Plan: Chronic problem.  Currently uses CPAP at night.    Normocytic anemia, slight decrease in hemoglobin 11.8.  Otherwise labs reassuring. Suspect lack of sleep and caloric intake compared to energy expenditure contributing to fatigue. Follow-up with pulmonology to check CPAP titration Will check ferritin levels to rule out iron deficiency  Orders: -     Iron, TIBC and Ferritin Panel  Iron deficiency -     Iron (Ferrous Sulfate); Take 325 mg by mouth every other day.  Dispense: 180 tablet; Refill: 1 -     Iron, TIBC and Ferritin Panel; Future  Chronic fatigue Assessment & Plan: Chronic problem.  Currently uses CPAP at night.    Normocytic anemia, slight decrease in hemoglobin 11.8.  Otherwise labs reassuring. Suspect lack of sleep and caloric intake compared to  energy expenditure contributing to fatigue. Follow-up with pulmonology to check CPAP titration Will check ferritin levels to rule out iron deficiency   Menopausal symptoms Assessment & Plan: Was prescribed NK3 antagonist by [facility].  Takes Vezoha 45 mg daily. Will defer continued management to [facility] provider   OSA (obstructive sleep apnea) Assessment & Plan: Chronic.  Stable.   Continue CPAP Follow-up with pulmonology for possible titration of CPAP   H/O gastric bypass Assessment & Plan: Suspect vitamin deficiency contributing to fatigue. Currently on metformin, tirzepatide with vitamin B12, multivitamins. Follow-up with [facility] for continued weight loss maintenance    Vitamin D deficiency Assessment & Plan: Chronic Vitamin D level low normal Continue vitamin D supplements    PDMP reviewed  Return if symptoms worsen or fail to improve.  Dana Allan, MD

## 2022-11-27 ENCOUNTER — Encounter: Payer: Self-pay | Admitting: Family Medicine

## 2022-11-27 LAB — IRON,TIBC AND FERRITIN PANEL
%SAT: 15 % (calc) — ABNORMAL LOW (ref 16–45)
Ferritin: 14 ng/mL — ABNORMAL LOW (ref 16–232)
Iron: 62 ug/dL (ref 45–160)
TIBC: 426 mcg/dL (calc) (ref 250–450)

## 2022-11-27 MED ORDER — IRON (FERROUS SULFATE) 325 (65 FE) MG PO TABS
325.0000 mg | ORAL_TABLET | ORAL | 1 refills | Status: AC
Start: 2022-11-27 — End: ?

## 2022-11-30 ENCOUNTER — Encounter: Payer: Self-pay | Admitting: Family Medicine

## 2022-11-30 DIAGNOSIS — E611 Iron deficiency: Secondary | ICD-10-CM | POA: Insufficient documentation

## 2022-11-30 NOTE — Assessment & Plan Note (Signed)
Suspect vitamin deficiency contributing to fatigue. Currently on metformin, tirzepatide with vitamin B12, multivitamins. Follow-up with [facility] for continued weight loss maintenance

## 2022-11-30 NOTE — Assessment & Plan Note (Addendum)
Was prescribed NK3 antagonist by NP at [facility].  Takes Vezoha 45 mg daily. Will defer continued management to [facility] provider

## 2022-11-30 NOTE — Assessment & Plan Note (Signed)
Chronic Vitamin D level low normal Continue vitamin D supplements

## 2022-11-30 NOTE — Assessment & Plan Note (Signed)
Chronic.  Stable.   Continue CPAP Follow-up with pulmonology for possible titration of CPAP

## 2022-11-30 NOTE — Assessment & Plan Note (Addendum)
Chronic problem.  Currently uses CPAP at night.    Normocytic anemia, slight decrease in hemoglobin 11.8.  Otherwise labs reassuring. Suspect lack of sleep and caloric intake compared to energy expenditure contributing to fatigue. Follow-up with pulmonology to check CPAP titration Will check ferritin levels to rule out iron deficiency

## 2022-12-02 DIAGNOSIS — G4733 Obstructive sleep apnea (adult) (pediatric): Secondary | ICD-10-CM | POA: Diagnosis not present

## 2022-12-11 ENCOUNTER — Encounter: Payer: Self-pay | Admitting: Adult Health

## 2022-12-11 ENCOUNTER — Ambulatory Visit: Payer: BC Managed Care – PPO | Admitting: Adult Health

## 2022-12-11 VITALS — BP 90/66 | HR 73 | Temp 97.5°F | Ht 65.0 in | Wt 167.8 lb

## 2022-12-11 DIAGNOSIS — G4733 Obstructive sleep apnea (adult) (pediatric): Secondary | ICD-10-CM | POA: Diagnosis not present

## 2022-12-11 DIAGNOSIS — G4710 Hypersomnia, unspecified: Secondary | ICD-10-CM | POA: Diagnosis not present

## 2022-12-11 NOTE — Assessment & Plan Note (Signed)
Excellent control on CPAP.  Change CPAP pressure for comfort to auto CPAP 10 to 15 cm H2O.  Encourage patient to wear at least 6 hours each night.  See if this will help with her ongoing hypersomnia.  Plan  Patient Instructions  Continue on CPAP at bedtime goal is to wear all night long for at least 6 or more hours Change CPAP pressure 10 to 15 cm.  Set up for Sleep study followed by MSLT. Healthy sleep regimen Activity as tolerated Do not drive if sleepy Work on healthy weight loss Follow-up with Dr. Craige Cotta or Jocob Dambach NP in 3 months and As needed    n

## 2022-12-11 NOTE — Assessment & Plan Note (Signed)
Hypersomnia despite improved control of underlying sleep apnea.  Patient may have a component of narcolepsy.  Will set up for an in lab sleep study followed by multi sleep latency test.  Plan  Patient Instructions  Continue on CPAP at bedtime goal is to wear all night long for at least 6 or more hours Change CPAP pressure 10 to 15 cm.  Set up for Sleep study followed by MSLT. Healthy sleep regimen Activity as tolerated Do not drive if sleepy Work on healthy weight loss Follow-up with Dr. Craige Cotta or Dagny Fiorentino NP in 3 months and As needed

## 2022-12-11 NOTE — Progress Notes (Signed)
Reviewed and agree with assessment/plan.   Coralyn Helling, MD [facility] Pulmonary/Critical Care 12/11/2022, 4:27 PM Pager:  [phone]

## 2022-12-11 NOTE — Progress Notes (Signed)
@Patient  ID: Valerie Colon, female    DOB: [birth year], 52 y.o.   MRN: [id number]  Chief Complaint  Patient presents with   Follow-up    Referring provider: Dana Allan, MD  HPI: 52 year old female seen for sleep consult February 20, 2022 to establish for sleep apnea Previous diagnosis sleep apnea , CPAP stopped after gastric bypass with significant weight loss  TEST/EVENTS :  HST 04/24/22 >> AHI 5.2, SpO2 low 83%   12/11/2022 Follow up ; OSA  Patient returns for a 47-month follow-up.  Patient has underlying mild obstructive sleep apnea.  Patient recently restarted CPAP therapy.  She did not want to try oral appliance due to TMJ issues in the past.  Patient says she is wearing her CPAP.  She feels like she is benefiting from CPAP but still has daytime sleepiness.  CPAP download shows excellent compliance with daily average usage at 5 hours.  Patient is on auto CPAP 5 to 15 cm H2O.  AHI is 0.6.  Daily average pressure at 10.4 cm H2O. Complains that she still gets sleepy working, driving, watching TV, working on [employer]. She is better if she stays busy. Not taking any sedating medications .    No Known Allergies  Immunization History  Administered Date(s) Administered   Influenza,inj,Quad PF,6+ Mos 06/05/2017, 05/07/2018, 05/06/2019, 05/16/2020   Influenza,inj,quad, With Preservative 06/05/2017   Influenza-Unspecified 04/30/2012, 06/13/2015, 05/01/2021, 07/18/2022   Moderna Covid-19 Vaccine Bivalent Booster 75yrs & up 05/01/2021   Moderna SARS-COV2 Booster Vaccination 07/30/2020, 07/18/2022   Moderna Sars-Covid-2 Vaccination 11/01/2019, 11/29/2019, 12/02/2019, 07/27/2020   Tdap 01/24/2016, 01/16/2018   Zoster Recombinat (Shingrix) 11/16/2021, 01/16/2022    Past Medical History:  Diagnosis Date   Acid reflux    Allergic rhinitis    Allergy    ANA positive 05/16/2020   Dr. Dierdre Forth rheumatology seen in the past for elevated ANA obtained notes and ENA panel negative and negative  autoimmune etiology as of 2021    Annual physical exam 05/06/2019   Anxiety    Benign cyst of right kidney 05/16/2020   CT renal 09/30/2018   Adrenals bilaterally appear unremarkable.  There is a cyst arising from the lateral mid to lower portion right  kidney measuring 1.2 x 1.2 cm. There is no appreciable  hydronephrosis on either side. There is a calculus in the lower pole  of the right kidney measuring 5 x 5 mm. There is a calculus in the  mid left kidney measuring 1 mm. 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 Acarbose prescribed.    Hypotension 11/13/2017   Increased frequency of headaches 10/02/2017   Intermittent palpitations 11/09/2015   Kidney stones    Mass of finger    myxoid neurofibroma multiple   Migraine    Migraine without aura and without status migrainosus, not intractable 10/09/2017   Nephrolithiasis  02/29/2016   CT 09/30/2018   Adrenals bilaterally appear unremarkable.  There is a cyst arising from the lateral mid to lower portion right  kidney measuring 1.2 x 1.2 cm. There is no appreciable  hydronephrosis on either side. There is a calculus in the lower pole  of the right kidney measuring 5 x 5 mm. There is a calculus in the  mid left kidney measuring 1 mm. There is no appreciable ureteral  calculus on e   Obstructive apnea 01/20/2014   Ovarian cyst,  left    Pelvic pain 10/09/2017   Postprandial hypoglycemia 02/12/2018   Shoulder pain, right 10/09/2017   Snoring 07/31/2017   Soft tissue lesion of elbow region 10/09/2017   Sprain of ligament of tarsometatarsal joint 04/01/2016    Tobacco History: Social History   Tobacco Use  Smoking Status Never   Passive exposure: Past  Smokeless Tobacco Never   Counseling given: Not Answered   Outpatient Medications Prior to Visit  Medication Sig Dispense Refill   CALCIUM PO Take by mouth.     Galcanezumab-gnlm (EMGALITY) 120 MG/ML SOSY Inject 120 mg into the skin every 30 (thirty) days. 1 mL 2   ibuprofen (ADVIL) 600 MG tablet Take 1 tablet by mouth 3 (three) times daily.     Iron, Ferrous Sulfate, 325 (65 Fe) MG TABS Take 325 mg by mouth every other day. 180 tablet 1   metFORMIN (GLUCOPHAGE) 500 MG tablet Take 500 mg by mouth 2 (two) times daily.     Multiple Vitamin (MULTIVITAMIN) tablet Take 1 tablet by mouth daily.     ondansetron (ZOFRAN ODT) 4 MG disintegrating tablet Take 1 tablet (4 mg total) by mouth every 8 (eight) hours as needed for nausea or vomiting. 30 tablet 6   pantoprazole (PROTONIX) 40 MG tablet Take 40 mg by mouth daily as needed.     Ubrogepant (UBRELVY) 100 MG TABS Take 100 mg by mouth every 2 (two) hours as needed (migraine.). Take as close to onset of migraine as possible. DO NOT EXCEED 200 MG IN 24 HOURS. 16 tablet 9   VEOZAH 45 MG TABS Take 1 tablet by mouth daily.     ZEPBOUND 5 MG/0.5ML Pen Inject into the skin.     tirzepatide (MOUNJARO) 12.5 MG/0.5ML Pen Inject into the skin. (Patient not taking: Reported on 12/11/2022)     No facility-administered medications prior to visit.     Review of Systems:   Constitutional:   No  weight loss, night sweats,  Fevers, chills + fatigue, or  lassitude.  HEENT:   No headaches,  Difficulty swallowing,  Tooth/dental problems, or  Sore throat,                No sneezing, itching, ear ache, nasal congestion, post nasal  drip,   CV:  No chest pain,  Orthopnea, PND, swelling in lower extremities, anasarca, dizziness, palpitations, syncope.   GI  No heartburn, indigestion, abdominal pain, nausea, vomiting, diarrhea, change in bowel habits, loss of appetite, bloody stools.   Resp: No shortness of breath with exertion or at rest.  No excess mucus, no productive cough,  No non-productive cough,  No coughing up of blood.  No change in color of mucus.  No wheezing.  No chest wall deformity  Skin: no rash or lesions.  GU: no dysuria, change in color of urine, no urgency or frequency.  No flank pain, no hematuria   MS:  No joint pain or swelling.  No decreased  range of motion.  No back pain.    Physical Exam  BP 90/66 (BP Location: Left Arm, Patient Position: Sitting, Cuff Size: Normal)   Pulse 73   Temp (!) 97.5 F (36.4 C) (Oral)   Ht 5\' 5"  (1.651 m)   Wt 167 lb 12.8 oz (76.1 kg)   LMP  (LMP Unknown) Comment: neg upt  SpO2 100%   BMI 27.92 kg/m   GEN: A/Ox3; pleasant , NAD, well nourished    HEENT:  [place]/AT,  EACs-clear, TMs-wnl, NOSE-clear, THROAT-clear, no lesions, no postnasal drip or exudate noted. Class 3 MP airway   NECK:  Supple w/ fair ROM; no JVD; normal carotid impulses w/o bruits; no thyromegaly or nodules palpated; no lymphadenopathy.    RESP  Clear  P & A; w/o, wheezes/ rales/ or rhonchi. no accessory muscle use, no dullness to percussion  CARD:  RRR, no m/r/g, no peripheral edema, pulses intact, no cyanosis or clubbing.  GI:   Soft & nt; nml bowel sounds; no organomegaly or masses detected.   Musco: Warm bil, no deformities or joint swelling noted.   Neuro: alert, no focal deficits noted.    Skin: Warm, no lesions or rashes    Lab Results:  CBC    Component Value Date/Time   WBC 4.8 10/31/2022 0745   RBC 3.90 10/31/2022 0745   HGB 11.8 (L) 10/31/2022 0745   HGB 11.4 04/21/2018 1045   HCT 36.1 10/31/2022 0745   HCT 34.7 04/21/2018 1045   PLT 268.0 10/31/2022 0745   PLT  251 04/21/2018 1045   MCV 92.4 10/31/2022 0745   MCV 95 04/21/2018 1045   MCH 31.4 12/29/2020 2100   MCHC 32.8 10/31/2022 0745   RDW 14.1 10/31/2022 0745   RDW 12.9 04/21/2018 1045   LYMPHSABS 2.1 10/31/2022 0745   LYMPHSABS 1.7 04/21/2018 1045   MONOABS 0.3 10/31/2022 0745   EOSABS 0.1 10/31/2022 0745   EOSABS 0.1 04/21/2018 1045   BASOSABS 0.0 10/31/2022 0745   BASOSABS 0.0 04/21/2018 1045    BMET    Component Value Date/Time   NA 142 10/31/2022 0745   NA 140 11/09/2020 1424   K 4.3 10/31/2022 0745   CL 104 10/31/2022 0745   CO2 26 10/31/2022 0745   GLUCOSE 80 10/31/2022 0745   BUN 17 10/31/2022 0745   BUN 27 (H) 11/09/2020 1424   CREATININE 1.01 10/31/2022 0745   CREATININE 1.45 (H) 05/06/2019 1550   CALCIUM 9.2 10/31/2022 0745   GFRNONAA >60 12/29/2020 2100   GFRAA >60 09/30/2018 0730    BNP No results found for: "BNP"  ProBNP No results found for: "PROBNP"  Imaging: No results found.        No data to display          No results found for: "NITRICOXIDE"      Assessment & Plan:   OSA (obstructive sleep apnea) Excellent control on CPAP.  Change CPAP pressure for comfort to auto CPAP 10 to 15 cm H2O.  Encourage patient to wear at least 6 hours each night.  See if this will help with her ongoing hypersomnia.  Plan  Patient Instructions  Continue on CPAP at bedtime goal is to wear all night long for at least 6 or more hours Change CPAP pressure 10 to 15 cm.  Set up for Sleep study followed by MSLT. Healthy sleep regimen Activity as tolerated Do not drive if sleepy Work on healthy weight loss Follow-up with Dr. Craige Cotta or Clent Ridges NP  in 3 months and As needed    n   Hypersomnia Hypersomnia despite improved control of underlying sleep apnea.  Patient may have a component of narcolepsy.  Will set up for an in lab sleep study followed by multi sleep latency test.  Plan  Patient Instructions  Continue on CPAP at bedtime goal is to wear all night  long for at least 6 or more hours Change CPAP pressure 10 to 15 cm.  Set up for Sleep study followed by MSLT. Healthy sleep regimen Activity as tolerated Do not drive if sleepy Work on healthy weight loss Follow-up with Dr. Craige Cotta or Perrin Eddleman NP in 3 months and As needed         Rubye Oaks, NP 12/11/2022

## 2022-12-11 NOTE — Addendum Note (Signed)
Addended by: Delrae Rend on: 12/11/2022 04:39 PM   Modules accepted: Orders

## 2022-12-11 NOTE — Patient Instructions (Addendum)
Continue on CPAP at bedtime goal is to wear all night long for at least 6 or more hours Change CPAP pressure 10 to 15 cm.  Set up for Sleep study followed by MSLT. Healthy sleep regimen Activity as tolerated Do not drive if sleepy Work on healthy weight loss Follow-up with Dr. Craige Cotta or Tonda Wiederhold NP in 3 months and As needed

## 2022-12-16 ENCOUNTER — Telehealth: Payer: Self-pay | Admitting: Neurology

## 2022-12-16 NOTE — Telephone Encounter (Signed)
Sent mychart msg informing pt of appt change due to provider schedule change 

## 2022-12-21 DIAGNOSIS — G4733 Obstructive sleep apnea (adult) (pediatric): Secondary | ICD-10-CM | POA: Diagnosis not present

## 2023-01-01 ENCOUNTER — Telehealth: Payer: Self-pay | Admitting: Pharmacy Technician

## 2023-01-01 NOTE — Telephone Encounter (Signed)
Patient Advocate Encounter   Received notification that prior authorization for Emgality 120MG /ML auto-injectors (migraine) is required.   PA submitted on 01/01/2023 [facility] Insurance [employer] Electronic PA Form Status is pending

## 2023-01-02 ENCOUNTER — Other Ambulatory Visit (HOSPITAL_COMMUNITY): Payer: Self-pay

## 2023-01-02 NOTE — Telephone Encounter (Signed)
Pharmacy Patient Advocate Encounter  Prior Authorization for Emgality 120MG /ML auto-injectors (migraine) has been approved by [employer] (ins).    PA # N/A Effective dates: 01/01/2023 through 01/01/2024  Copay is $80.00 per test claim

## 2023-01-05 ENCOUNTER — Other Ambulatory Visit (HOSPITAL_COMMUNITY): Payer: Self-pay

## 2023-01-05 DIAGNOSIS — G4733 Obstructive sleep apnea (adult) (pediatric): Secondary | ICD-10-CM | POA: Diagnosis not present

## 2023-01-06 ENCOUNTER — Other Ambulatory Visit (HOSPITAL_COMMUNITY): Payer: Self-pay

## 2023-01-16 NOTE — Telephone Encounter (Signed)
Authorization for both MSLT and NPSG are required and are currently pending

## 2023-01-16 NOTE — Telephone Encounter (Signed)
Valerie Colon,   Let me check on her insurance requirements now for this.

## 2023-01-21 DIAGNOSIS — G4733 Obstructive sleep apnea (adult) (pediatric): Secondary | ICD-10-CM | POA: Diagnosis not present

## 2023-02-03 NOTE — Addendum Note (Signed)
Addended by: Delrae Rend on: 02/03/2023 04:42 PM   Modules accepted: Orders

## 2023-02-05 DIAGNOSIS — G4733 Obstructive sleep apnea (adult) (pediatric): Secondary | ICD-10-CM | POA: Diagnosis not present

## 2023-02-08 ENCOUNTER — Ambulatory Visit (HOSPITAL_BASED_OUTPATIENT_CLINIC_OR_DEPARTMENT_OTHER): Payer: BC Managed Care – PPO | Attending: Adult Health | Admitting: Pulmonary Disease

## 2023-02-08 DIAGNOSIS — G4733 Obstructive sleep apnea (adult) (pediatric): Secondary | ICD-10-CM | POA: Diagnosis not present

## 2023-02-08 DIAGNOSIS — G4710 Hypersomnia, unspecified: Secondary | ICD-10-CM | POA: Diagnosis not present

## 2023-02-09 ENCOUNTER — Ambulatory Visit (HOSPITAL_BASED_OUTPATIENT_CLINIC_OR_DEPARTMENT_OTHER): Payer: BC Managed Care – PPO | Attending: Adult Health | Admitting: Pulmonary Disease

## 2023-02-09 DIAGNOSIS — G4710 Hypersomnia, unspecified: Secondary | ICD-10-CM | POA: Diagnosis not present

## 2023-02-09 DIAGNOSIS — G4733 Obstructive sleep apnea (adult) (pediatric): Secondary | ICD-10-CM

## 2023-02-09 DIAGNOSIS — G4711 Idiopathic hypersomnia with long sleep time: Secondary | ICD-10-CM | POA: Insufficient documentation

## 2023-02-20 DIAGNOSIS — G4733 Obstructive sleep apnea (adult) (pediatric): Secondary | ICD-10-CM | POA: Diagnosis not present

## 2023-02-25 NOTE — Telephone Encounter (Signed)
Received message from patient asking about her sleep study results from 6/23 and 6/24.   TP, can you please advise? Thanks!

## 2023-02-25 NOTE — Telephone Encounter (Signed)
I do not see final results , can we reach out to see if they have been read

## 2023-03-03 DIAGNOSIS — G4733 Obstructive sleep apnea (adult) (pediatric): Secondary | ICD-10-CM | POA: Diagnosis not present

## 2023-03-03 NOTE — Procedures (Signed)
      Patient Name: Valerie, Colon Date: 02/09/2023 Gender: Female D.O.B: 1970-12-18 Age (years): 44 Referring Provider: Tammy Parrett Height (inches): 65 Interpreting Physician: Coralyn Helling MD, ABSM Weight (lbs): 160 RPSGT: Haworth Sink BMI: 27 MRN: [id number] Neck Size: 13.00  CLINICAL INFORMATION Sleep Study Type: MSLT  The patient was referred to the sleep center for evaluation of daytime sleepiness.  Epworth Sleepiness Score: 19  SLEEP STUDY TECHNIQUE A Multiple Sleep Latency Test was performed after an overnight polysomnogram according to the AASM scoring manual v2.3 (April 2016) and clinical guidelines. Five nap opportunities occurred over the course of the test which followed an overnight polysomnogram. The channels recorded and monitored were frontal, central, and occipital electroencephalography (EEG), right and left electrooculogram (EOG), chin electromyography (EMG), and electrocardiogram (EKG).  MEDICATIONS Medications taken by the patient : N/A Medications administered by patient during sleep study : No sleep medicine administered.  IMPRESSIONS - Total number of naps attempted: 5. Total number of naps with sleep attained: 5. The Mean Sleep Latency was 3.02 minutes. There were no sleep-onset REM periods. - The patient appears to have pathologic sleepiness, evidenced by a short mean sleep latency (8 minutes or less) on this MSLT.  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 - Return for follow up to evaluate other causes of excessive daytime sleepiness.  [Electronically signed] 03/03/2023 10:41 AM  Coralyn Helling MD, ABSM Diplomate, American Board of Sleep Medicine NPI: [id number]  [facility] PH: [phone]   FX: [phone] ACCREDITED BY THE AMERICAN ACADEMY OF SLEEP MEDICINE

## 2023-03-03 NOTE — Procedures (Signed)
      Patient Name: Valerie Colon, Valerie Colon Date: 02/08/2023 Gender: Female D.O.B: January 25, 1971 Age (years): 39 Referring Provider: Babette Relic Parrett Height (inches): 65 Interpreting Physician: Coralyn Helling MD, ABSM Weight (lbs): 160 RPSGT: Rosette Reveal BMI: 27 MRN: [id number] Neck Size: 13.00  CLINICAL INFORMATION The patient is referred for a CPAP titration to treat sleep apnea.  SLEEP STUDY TECHNIQUE As per the AASM Manual for the Scoring of Sleep and Associated Events v2.3 (April 2016) with a hypopnea requiring 4% desaturations.  The channels recorded and monitored were frontal, central and occipital EEG, electrooculogram (EOG), submentalis EMG (chin), nasal and oral airflow, thoracic and abdominal wall motion, anterior tibialis EMG, snore microphone, electrocardiogram, and pulse oximetry. Continuous positive airway pressure (CPAP) was initiated at the beginning of the study and titrated to treat sleep-disordered breathing.  MEDICATIONS Medications self-administered by patient taken the night of the study : N/A  TECHNICIAN COMMENTS Comments added by technician: None Comments added by scorer: N/A  RESPIRATORY PARAMETERS Optimal PAP Pressure (cm): 10 AHI at Optimal Pressure (/hr): 0 Overall Minimal O2 (%): 91.0 Supine % at Optimal Pressure (%): 27 Minimal O2 at Optimal Pressure (%): 91.0   SLEEP ARCHITECTURE The study was initiated at 10:57:52 PM and ended at 6:01:48 AM.  Sleep onset time was 25.3 minutes and the sleep efficiency was 86.1%. The total sleep time was 365 minutes.  The patient spent 7.9% of the night in stage N1 sleep, 76.3% in stage N2 sleep, 0.0% in stage N3 and 15.8% in REM.Stage REM latency was 91.0 minutes  Wake after sleep onset was 33.7. Alpha intrusion was absent. Supine sleep was 26.99%.  CARDIAC DATA The 2 lead EKG demonstrated sinus rhythm. The mean heart rate was 57.2 beats per minute. Other EKG findings include: None.  LEG MOVEMENT DATA The  total Periodic Limb Movements of Sleep (PLMS) were 0. The PLMS index was 0.0. A PLMS index of <15 is considered normal in adults.  IMPRESSIONS - The optimal PAP pressure was 10 cm of water. - Significant oxygen desaturations were not observed during this titration (min O2 = 91.0%). - No snoring was audible during this study. - No cardiac abnormalities were observed during this study. - Clinically significant periodic limb movements were not noted during this study. Arousals associated with PLMs were rare.  DIAGNOSIS - Obstructive Sleep Apnea (G47.33)  RECOMMENDATIONS - Trial of CPAP therapy on 10 cm H2O with a Small size Resmed Nasal AirTouch N20 mask and heated humidification. - Avoid alcohol, sedatives and other CNS depressants that may worsen sleep apnea and disrupt normal sleep architecture. - Sleep hygiene should be reviewed to assess factors that may improve sleep quality. - Weight management and regular exercise should be initiated or continued. - Correlate with multiple sleep latency test findings on 02/09/2023  [Electronically signed] 03/03/2023 10:34 AM  Coralyn Helling MD, ABSM Diplomate, American Board of Sleep Medicine NPI: [id number]  [facility] PH: [phone]   FX: [phone] ACCREDITED BY THE AMERICAN ACADEMY OF SLEEP MEDICINE

## 2023-03-07 DIAGNOSIS — G4733 Obstructive sleep apnea (adult) (pediatric): Secondary | ICD-10-CM | POA: Diagnosis not present

## 2023-03-11 NOTE — Telephone Encounter (Signed)
Patient has an appt tomorrow to review results. Will close encounter.

## 2023-03-12 ENCOUNTER — Encounter: Payer: Self-pay | Admitting: Adult Health

## 2023-03-12 ENCOUNTER — Ambulatory Visit: Payer: BC Managed Care – PPO | Admitting: Adult Health

## 2023-03-12 VITALS — BP 102/70 | HR 69 | Ht 65.0 in | Wt 163.8 lb

## 2023-03-12 DIAGNOSIS — G4711 Idiopathic hypersomnia with long sleep time: Secondary | ICD-10-CM | POA: Diagnosis not present

## 2023-03-12 DIAGNOSIS — G4733 Obstructive sleep apnea (adult) (pediatric): Secondary | ICD-10-CM

## 2023-03-12 MED ORDER — MODAFINIL 200 MG PO TABS: 200.0000 mg | ORAL_TABLET | Freq: Every day | ORAL | 0 refills | Status: DC

## 2023-03-12 NOTE — Patient Instructions (Addendum)
Begin Provigil 200mg  daily in am .  Continue on CPAP at bedtime goal is to wear all night long for at least 6 or more hours Healthy sleep regimen Activity as tolerated Do not drive if sleepy Work on healthy weight loss Follow-up with Dr. Craige Cotta or Ardyth Kelso in 6-8 weeks and As needed

## 2023-03-12 NOTE — Assessment & Plan Note (Signed)
MSLT shows Idiopathic hypersomnia. Has significant symptom burden during daytime. Will try Provigil. Patient education given. Discussed side effect profile.  Start at provigil 200mg  daily follow up in 6 weeks and eval repsonse.  Plan Patient Instructions  Begin Provigil 200mg  daily in am .  Continue on CPAP at bedtime goal is to wear all night long for at least 6 or more hours Healthy sleep regimen Activity as tolerated Do not drive if sleepy Work on healthy weight loss Follow-up with Dr. Craige Cotta or Hai Grabe in 6-8 weeks and As needed

## 2023-03-12 NOTE — Progress Notes (Signed)
Reviewed and agree with assessment/plan.   Coralyn Helling, MD [facility] 03/12/2023, 10:12 AM Pager:  [phone]

## 2023-03-12 NOTE — Assessment & Plan Note (Signed)
Encouraged on increased daily usage.  Continue on current settings.  Plan  Patient Instructions  Begin Provigil 200mg  daily in am .  Continue on CPAP at bedtime goal is to wear all night long for at least 6 or more hours Healthy sleep regimen Activity as tolerated Do not drive if sleepy Work on healthy weight loss Follow-up with Dr. Craige Cotta or Freddie Nghiem in 6-8 weeks and As needed     '

## 2023-03-12 NOTE — Progress Notes (Signed)
@Patient  ID: Valerie Colon, female    DOB: 1970-10-03, 52 y.o.   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  Patient has underlying mild obstructive sleep apnea.  She was recently restarted on CPAP therapy.  She does feel like the CPAP is helping.  But she continued to have some ongoing daytime sleepiness that was interfering with her daily work and activities.  She was set up for an in lab CPAP titration study followed by a multi sleep latency test.  These were completed on February 08, 2023.  Titration study showed optimal control on CPAP 10 cm H2O.  Multi sleep latency test was negative for narcolepsy.  Showed some pathological sleep.  And idiopathic hypersomnia.  Total number of naps attempted with 5.  Total number of naps obtained was 5.  Main sleep latency was 3 minutes.  No sleep onset REM.Marland Kitchen CPAP download shows good compliance with daily average usage ~5hr , AHI 0.6 . Autoset 10-15cmH2o. Uses nasal mask . Feels it helps but still has sleepiness.  No history of Hypertension, CAD, DM.   No Known Allergies  Immunization History  Administered Date(s) Administered   Influenza,inj,Quad PF,6+ Mos 06/05/2017, 05/07/2018, 05/06/2019, 05/16/2020   Influenza,inj,quad, With Preservative 06/05/2017   Influenza-Unspecified 04/30/2012, 06/13/2015, 05/01/2021, 07/18/2022   Moderna Covid-19 Vaccine Bivalent Booster 12yrs & up 05/01/2021   Moderna SARS-COV2 Booster Vaccination 07/30/2020, 07/18/2022   Moderna Sars-Covid-2 Vaccination 11/01/2019, 11/29/2019, 12/02/2019, 07/27/2020   Tdap 01/24/2016, 01/16/2018   Zoster  Recombinant(Shingrix) 11/16/2021, 01/16/2022    Past Medical History:  Diagnosis Date   Acid reflux    Allergic rhinitis    Allergy    ANA positive 05/16/2020   Dr. Dierdre Forth rheumatology seen in the past for elevated ANA obtained notes and ENA panel negative and negative autoimmune etiology as of 2021    Annual physical exam 05/06/2019   Anxiety    Benign cyst of right kidney 05/16/2020   CT renal 09/30/2018   Adrenals bilaterally appear unremarkable.  There is a cyst arising from the lateral mid to lower portion right  kidney measuring 1.2 x 1.2 cm. There is no appreciable  hydronephrosis on either side. There is a calculus in the lower pole  of the right kidney measuring 5 x 5 mm. There is a calculus in the  mid left kidney measuring 1 mm. 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 Acarbose prescribed.    Hypotension 11/13/2017  Increased frequency of headaches 10/02/2017   Intermittent palpitations 11/09/2015   Kidney stones    Mass of finger    myxoid neurofibroma multiple   Migraine    Migraine without aura and without status migrainosus, not intractable 10/09/2017   Nephrolithiasis 02/29/2016   CT 09/30/2018   Adrenals bilaterally appear unremarkable.  There is a cyst arising from the lateral mid to lower portion right  kidney measuring 1.2 x 1.2 cm. There is no  appreciable  hydronephrosis on either side. There is a calculus in the lower pole  of the right kidney measuring 5 x 5 mm. There is a calculus in the  mid left kidney measuring 1 mm. There is no appreciable ureteral  calculus on e   Obstructive apnea 01/20/2014   Ovarian cyst, left    Pelvic pain 10/09/2017   Postprandial hypoglycemia 02/12/2018   Shoulder pain, right 10/09/2017   Snoring 07/31/2017   Soft tissue lesion of elbow region 10/09/2017   Sprain of ligament of tarsometatarsal joint 04/01/2016    Tobacco History: Social History   Tobacco Use  Smoking Status Never   Passive exposure: Past  Smokeless Tobacco Never   Counseling given: Not Answered   Outpatient Medications Prior to Visit  Medication Sig Dispense Refill   CALCIUM PO Take by mouth.     Galcanezumab-gnlm (EMGALITY) 120 MG/ML SOSY Inject 120 mg into the skin every 30 (thirty) days. 1 mL 2   ibuprofen (ADVIL) 600 MG tablet Take 1 tablet by mouth 3 (three) times daily.     Iron, Ferrous Sulfate, 325 (65 Fe) MG TABS Take 325 mg by mouth every other day. 180 tablet 1   metFORMIN (GLUCOPHAGE) 500 MG tablet Take 500 mg by mouth 2 (two) times daily.     Multiple Vitamin (MULTIVITAMIN) tablet Take 1 tablet by mouth daily.     ondansetron (ZOFRAN ODT) 4 MG disintegrating tablet Take 1 tablet (4 mg total) by mouth every 8 (eight) hours as needed for nausea or vomiting. 30 tablet 6   pantoprazole (PROTONIX) 40 MG tablet Take 40 mg by mouth daily as needed.     Ubrogepant (UBRELVY) 100 MG TABS Take 100 mg by mouth every 2 (two) hours as needed (migraine.). Take as close to onset of migraine as possible. DO NOT EXCEED 200 MG IN 24 HOURS. 16 tablet 9   VEOZAH 45 MG TABS Take 1 tablet by mouth daily.     ZEPBOUND 5 MG/0.5ML Pen Inject into the skin.     No facility-administered medications prior to visit.     Review of Systems:   Constitutional:   No  weight loss, night sweats,  Fevers, chills,  +fatigue, or   lassitude.  HEENT:   No headaches,  Difficulty swallowing,  Tooth/dental problems, or  Sore throat,                No sneezing, itching, ear ache, nasal congestion, post nasal drip,   CV:  No chest pain,  Orthopnea, PND, swelling in lower extremities, anasarca, dizziness, palpitations, syncope.   GI  No heartburn, indigestion, abdominal pain, nausea, vomiting, diarrhea, change in bowel habits, loss of appetite, bloody stools.   Resp: No shortness of breath with exertion or at rest.  No excess mucus, no productive cough,  No non-productive cough,  No coughing up of blood.  No change in color of mucus.  No wheezing.  No chest wall deformity  Skin: no rash or lesions.  GU: no dysuria, change  in color of urine, no urgency or frequency.  No flank pain, no hematuria   MS:  No joint pain or swelling.  No decreased range of motion.  No back pain.    Physical Exam  BP 102/70 (BP Location: Left Arm, Patient Position: Sitting, Cuff Size: Normal)   Pulse 69   Ht 5\' 5"  (1.651 m)   Wt 163 lb 12.8 oz (74.3 kg)   LMP  (LMP Unknown) Comment: neg upt  SpO2 100%   BMI 27.26 kg/m   GEN: A/Ox3; pleasant , NAD, well nourished    HEENT:  [place]/AT,   NOSE-clear, THROAT-clear, no lesions, no postnasal drip or exudate noted. Class 3 MP airway    NECK:  Supple w/ fair ROM; no JVD; normal carotid impulses w/o bruits; no thyromegaly or nodules palpated; no lymphadenopathy.    RESP  Clear  P & A; w/o, wheezes/ rales/ or rhonchi. no accessory muscle use, no dullness to percussion  CARD:  RRR, no m/r/g, no peripheral edema, pulses intact, no cyanosis or clubbing.  GI:   Soft & nt; nml bowel sounds; no organomegaly or masses detected.   Musco: Warm bil, no deformities or joint swelling noted.   Neuro: alert, no focal deficits noted.    Skin: Warm, no lesions or rashes    Lab Results:  CBC    Component Value Date/Time   WBC 4.8 10/31/2022 0745   RBC 3.90 10/31/2022 0745   HGB 11.8 (L) 10/31/2022  0745   HGB 11.4 04/21/2018 1045   HCT 36.1 10/31/2022 0745   HCT 34.7 04/21/2018 1045   PLT 268.0 10/31/2022 0745   PLT 251 04/21/2018 1045   MCV 92.4 10/31/2022 0745   MCV 95 04/21/2018 1045   MCH 31.4 12/29/2020 2100   MCHC 32.8 10/31/2022 0745   RDW 14.1 10/31/2022 0745   RDW 12.9 04/21/2018 1045   LYMPHSABS 2.1 10/31/2022 0745   LYMPHSABS 1.7 04/21/2018 1045   MONOABS 0.3 10/31/2022 0745   EOSABS 0.1 10/31/2022 0745   EOSABS 0.1 04/21/2018 1045   BASOSABS 0.0 10/31/2022 0745   BASOSABS 0.0 04/21/2018 1045    BMET    Component Value Date/Time   NA 142 10/31/2022 0745   NA 140 11/09/2020 1424   K 4.3 10/31/2022 0745   CL 104 10/31/2022 0745   CO2 26 10/31/2022 0745   GLUCOSE 80 10/31/2022 0745   BUN 17 10/31/2022 0745   BUN 27 (H) 11/09/2020 1424   CREATININE 1.01 10/31/2022 0745   CREATININE 1.45 (H) 05/06/2019 1550   CALCIUM 9.2 10/31/2022 0745   GFRNONAA >60 12/29/2020 2100   GFRAA >60 09/30/2018 0730    BNP No results found for: "BNP"  ProBNP No results found for: "PROBNP"  Imaging: SLEEP STUDY DOCUMENTS  Result Date: 02/11/2023 Ordered by an unspecified provider.   Administration History     None           No data to display          No results found for: "NITRICOXIDE"      Assessment & Plan:   No problem-specific Assessment & Plan notes found for this encounter.     Rubye Oaks, NP 03/12/2023

## 2023-03-16 ENCOUNTER — Telehealth: Payer: Self-pay

## 2023-03-16 ENCOUNTER — Other Ambulatory Visit (HOSPITAL_COMMUNITY): Payer: Self-pay

## 2023-03-16 NOTE — Telephone Encounter (Signed)
*  Pulm  Pharmacy Patient Advocate Encounter   Received notification from CoverMyMeds that prior authorization for Modafinil 200MG  tablets  is required/requested.   Insurance verification completed.   The patient is insured through [employer] [facility] .   Per test claim: PA required; PA submitted to [facility] via CoverMyMeds Key/confirmation #/EOC [id number] Status is pending  Pharmacy Patient Advocate Encounter  Received notification from [facility] that Prior Authorization for {Modafinil 200MG  tablets has been APPROVED from 03/16/2023 to 03/15/2024. Ran test claim, Copay is $0.00/30 days.  PA #/Case ID/Reference #: [id number]

## 2023-04-13 ENCOUNTER — Other Ambulatory Visit (HOSPITAL_BASED_OUTPATIENT_CLINIC_OR_DEPARTMENT_OTHER): Payer: Self-pay

## 2023-04-14 DIAGNOSIS — G4733 Obstructive sleep apnea (adult) (pediatric): Secondary | ICD-10-CM | POA: Diagnosis not present

## 2023-04-24 ENCOUNTER — Ambulatory Visit (INDEPENDENT_AMBULATORY_CARE_PROVIDER_SITE_OTHER): Payer: BLUE CROSS/BLUE SHIELD | Admitting: Adult Health

## 2023-04-24 ENCOUNTER — Encounter: Payer: Self-pay | Admitting: Adult Health

## 2023-04-24 VITALS — BP 110/76 | HR 81 | Ht 65.0 in | Wt 163.6 lb

## 2023-04-24 DIAGNOSIS — Z23 Encounter for immunization: Secondary | ICD-10-CM | POA: Diagnosis not present

## 2023-04-24 DIAGNOSIS — G4711 Idiopathic hypersomnia with long sleep time: Secondary | ICD-10-CM | POA: Diagnosis not present

## 2023-04-24 DIAGNOSIS — G4733 Obstructive sleep apnea (adult) (pediatric): Secondary | ICD-10-CM | POA: Diagnosis not present

## 2023-04-24 MED ORDER — MODAFINIL 200 MG PO TABS: 200.0000 mg | ORAL_TABLET | Freq: Every day | ORAL | 3 refills | Status: DC

## 2023-04-24 NOTE — Assessment & Plan Note (Signed)
Encouraged on increase daily usage   Plan  Patient Instructions  Continue on Provigil 200mg  daily in am .  Continue on CPAP at bedtime goal is to wear all night long for at least 6 or more hours Healthy sleep regimen Activity as tolerated Do not drive if sleepy Work on healthy weight loss Flu shot today  Follow-up with Dr. Wynona Neat or Jeanpierre Thebeau in 4 months and As needed  (30 min slot)

## 2023-04-24 NOTE — Patient Instructions (Addendum)
Continue on Provigil 200mg  daily in am .  Continue on CPAP at bedtime goal is to wear all night long for at least 6 or more hours Healthy sleep regimen Activity as tolerated Do not drive if sleepy Work on healthy weight loss Flu shot today  Follow-up with Dr. Wynona Neat or Leyanna Bittman in 4 months and As needed  (30 min slot)

## 2023-04-24 NOTE — Progress Notes (Signed)
@Patient  ID: Valerie Colon, female    DOB: [birth year], 52 y.o.   MRN: [id number]  Chief Complaint  Patient presents with   Follow-up    Referring provider: Dana Allan, MD  HPI: 52 year old female followed for sleep apnea with idiopathic hypersomnia   TEST/EVENTS :  02/08/23 CPAP titration study followed by a multi sleep latency test.  Titration study showed optimal control on CPAP 10 cm H2O.  Multi sleep latency test was negative for narcolepsy.  Showed some pathological sleep.  And idiopathic hypersomnia.  Total number of naps attempted with 5.  Total number of naps obtained was 5.  Main sleep latency was 3 minutes.  No sleep onset REM.Marland Kitchen   04/24/2023 Follow up  : Obstructive sleep apnea, hypersomnia Patient returns for a 6-week follow-up.  Patient is followed for obstructive sleep apnea on nocturnal CPAP.  Despite good compliance with CPAP patient had ongoing daytime sleepiness.  Multi sleep latency test was negative for narcolepsy.  Consistent with idiopathic hypersomnia.  Last visit she was started on Provigil 200 mg daily.  Patient says it has helped some with decreased daytime sleepiness.  Does feel like it wears off towards the end of the evening.  Especially around 2:30 PM.  She typically gets off work at 430. Patient denies any chest pain, palpitations, increased headaches. Patient continues to wear her CPAP at bedtime.  Does feel better when she wears her CPAP.  CPAP download shows 90% compliance.  Daily average usage at 6 hours.  Patient is on auto CPAP 10 to 15 cm H2O.  AHI 0.6/hour.  Patient is using a nasal mask.  No Known Allergies  Immunization History  Administered Date(s) Administered   Influenza, Seasonal, Injecte, Preservative Fre 04/24/2023   Influenza,inj,Quad PF,6+ Mos 06/05/2017, 05/07/2018, 05/06/2019, 05/16/2020   Influenza,inj,quad, With Preservative 06/05/2017   Influenza-Unspecified 04/30/2012, 06/13/2015, 05/01/2021, 07/18/2022   Moderna Covid-19 Vaccine  Bivalent Booster 44yrs & up 05/01/2021   Moderna SARS-COV2 Booster Vaccination 07/30/2020, 07/18/2022   Moderna Sars-Covid-2 Vaccination 11/01/2019, 11/29/2019, 12/02/2019, 07/27/2020   Tdap 01/24/2016, 01/16/2018   Zoster Recombinant(Shingrix) 11/16/2021, 01/16/2022    Past Medical History:  Diagnosis Date   Acid reflux    Allergic rhinitis    Allergy    ANA positive 05/16/2020   Dr. Dierdre Forth rheumatology seen in the past for elevated ANA obtained notes and ENA panel negative and negative autoimmune etiology as of 2021    Annual physical exam 05/06/2019   Anxiety    Benign cyst of right kidney 05/16/2020   CT renal 09/30/2018   Adrenals bilaterally appear unremarkable.  There is a cyst arising from the lateral mid to lower portion right  kidney measuring 1.2 x 1.2 cm. There is no appreciable  hydronephrosis on either side. There is a calculus in the lower pole  of the right kidney measuring 5 x 5 mm. There is a calculus in the  mid left kidney measuring 1 mm. There is no appreciable ureteral  calculu   Benign neoplasm of soft tissues of left upper extremity 11/23/2017   Cardiac murmur 02/12/2018   Chronic anemia 01/08/2015   Chronic fatigue 01/24/2016   Chronic gastritis 10/09/2017   Erosion noted anastamosis EGD 04/2016    Chronic pain of both knees 10/09/2017   COVID-19    09/2020   Dizziness 01/24/2016   Emotional stress 11/09/2015   Epigastric abdominal pain 11/13/2017   Family history of osteoporosis 05/07/2018   GAD (generalized anxiety disorder) 11/20/2015   Heart murmur  History of hiatal hernia    Hypoglycemia after GI (gastrointestinal) surgery 11/26/2012   Overview:   Followed at bariatric center. Acarbose prescribed.    Hypotension 11/13/2017   Increased frequency of headaches 10/02/2017   Intermittent palpitations 11/09/2015   Kidney stones    Mass of finger    myxoid neurofibroma multiple   Migraine    Migraine without aura and without status migrainosus, not  intractable 10/09/2017   Nephrolithiasis 02/29/2016   CT 09/30/2018   Adrenals bilaterally appear unremarkable.  There is a cyst arising from the lateral mid to lower portion right  kidney measuring 1.2 x 1.2 cm. There is no appreciable  hydronephrosis on either side. There is a calculus in the lower pole  of the right kidney measuring 5 x 5 mm. There is a calculus in the  mid left kidney measuring 1 mm. There is no appreciable ureteral  calculus on e   Obstructive apnea 01/20/2014   Ovarian cyst, left    Pelvic pain 10/09/2017   Postprandial hypoglycemia 02/12/2018   Shoulder pain, right 10/09/2017   Snoring 07/31/2017   Soft tissue lesion of elbow region 10/09/2017   Sprain of ligament of tarsometatarsal joint 04/01/2016    Tobacco History: Social History   Tobacco Use  Smoking Status Never   Passive exposure: Past  Smokeless Tobacco Never   Counseling given: Not Answered   Outpatient Medications Prior to Visit  Medication Sig Dispense Refill   CALCIUM PO Take by mouth.     Galcanezumab-gnlm (EMGALITY) 120 MG/ML SOSY Inject 120 mg into the skin every 30 (thirty) days. 1 mL 2   ibuprofen (ADVIL) 600 MG tablet Take 1 tablet by mouth 3 (three) times daily.     Iron, Ferrous Sulfate, 325 (65 Fe) MG TABS Take 325 mg by mouth every other day. 180 tablet 1   metFORMIN (GLUCOPHAGE) 500 MG tablet Take 500 mg by mouth 2 (two) times daily.     Multiple Vitamin (MULTIVITAMIN) tablet Take 1 tablet by mouth daily.     ondansetron (ZOFRAN ODT) 4 MG disintegrating tablet Take 1 tablet (4 mg total) by mouth every 8 (eight) hours as needed for nausea or vomiting. 30 tablet 6   pantoprazole (PROTONIX) 40 MG tablet Take 40 mg by mouth daily as needed.     Ubrogepant (UBRELVY) 100 MG TABS Take 100 mg by mouth every 2 (two) hours as needed (migraine.). Take as close to onset of migraine as possible. DO NOT EXCEED 200 MG IN 24 HOURS. 16 tablet 9   VEOZAH 45 MG TABS Take 1 tablet by mouth daily.      ZEPBOUND 5 MG/0.5ML Pen Inject into the skin.     modafinil (PROVIGIL) 200 MG tablet Take 1 tablet (200 mg total) by mouth daily. 30 tablet 0   No facility-administered medications prior to visit.     Review of Systems:   Constitutional:   No  weight loss, night sweats,  Fevers, chills, + fatigue, or  lassitude.  HEENT:   No headaches,  Difficulty swallowing,  Tooth/dental problems, or  Sore throat,                No sneezing, itching, ear ache, nasal congestion, post nasal drip,   CV:  No chest pain,  Orthopnea, PND, swelling in lower extremities, anasarca, dizziness, palpitations, syncope.   GI  No heartburn, indigestion, abdominal pain, nausea, vomiting, diarrhea, change in bowel habits, loss of appetite, bloody stools.   Resp: No shortness  of breath with exertion or at rest.  No excess mucus, no productive cough,  No non-productive cough,  No coughing up of blood.  No change in color of mucus.  No wheezing.  No chest wall deformity  Skin: no rash or lesions.  GU: no dysuria, change in color of urine, no urgency or frequency.  No flank pain, no hematuria   MS:  No joint pain or swelling.  No decreased range of motion.  No back pain.    Physical Exam  BP 110/76 (BP Location: Left Arm, Patient Position: Sitting, Cuff Size: Normal)   Pulse 81   Ht 5\' 5"  (1.651 m)   Wt 163 lb 9.6 oz (74.2 kg)   LMP  (LMP Unknown) Comment: neg upt  SpO2 99%   BMI 27.22 kg/m   GEN: A/Ox3; pleasant , NAD, well nourished    HEENT:  [employer]/AT,   NOSE-clear, THROAT-clear, no lesions, no postnasal drip or exudate noted.   NECK:  Supple w/ fair ROM; no JVD; normal carotid impulses w/o bruits; no thyromegaly or nodules palpated; no lymphadenopathy.    RESP  Clear  P & A; w/o, wheezes/ rales/ or rhonchi. no accessory muscle use, no dullness to percussion  CARD:  RRR, no m/r/g, no peripheral edema, pulses intact, no cyanosis or clubbing.  GI:   Soft & nt; nml bowel sounds; no organomegaly or masses  detected.   Musco: Warm bil, no deformities or joint swelling noted.   Neuro: alert, no focal deficits noted.    Skin: Warm, no lesions or rashes    Lab Results:  CBC     BNP No results found for: "BNP"  ProBNP No results found for: "PROBNP"  Imaging: No results found.  Administration History     None           No data to display          No results found for: "NITRICOXIDE"      Assessment & Plan:   OSA (obstructive sleep apnea) Encouraged on increase daily usage   Plan  Patient Instructions  Continue on Provigil 200mg  daily in am .  Continue on CPAP at bedtime goal is to wear all night long for at least 6 or more hours Healthy sleep regimen Activity as tolerated Do not drive if sleepy Work on healthy weight loss Flu shot today  Follow-up with Dr. Wynona Neat or Michella Detjen in 4 months and As needed  (30 min slot)     Hypersomnia, idiopathic Underlying sleep apnea with idiopathic hypersomnia.  Patient has had some perceived benefit with Provigil.  Continue on current dose. Discussed some helpful hints. Recommended to increase activity level.   Plan  Patient Instructions  Continue on Provigil 200mg  daily in am .  Continue on CPAP at bedtime goal is to wear all night long for at least 6 or more hours Healthy sleep regimen Activity as tolerated Do not drive if sleepy Work on healthy weight loss Flu shot today  Follow-up with Dr. Wynona Neat or Leva Baine in 4 months and As needed  (30 min slot)       Rubye Oaks, NP 04/24/2023

## 2023-04-24 NOTE — Assessment & Plan Note (Addendum)
Underlying sleep apnea with idiopathic hypersomnia.  Patient has had some perceived benefit with Provigil.  Continue on current dose. Discussed some helpful hints. Recommended to increase activity level.   Plan  Patient Instructions  Continue on Provigil 200mg  daily in am .  Continue on CPAP at bedtime goal is to wear all night long for at least 6 or more hours Healthy sleep regimen Activity as tolerated Do not drive if sleepy Work on healthy weight loss Flu shot today  Follow-up with Dr. Wynona Neat or Laini Urick in 4 months and As needed  (30 min slot)

## 2023-04-27 ENCOUNTER — Telehealth: Payer: BLUE CROSS/BLUE SHIELD | Admitting: Neurology

## 2023-04-28 NOTE — Progress Notes (Signed)
Reviewed and agree with assessment/plan.   Coralyn Helling, MD [facility] Pulmonary/Critical Care 04/28/2023, 5:52 PM Pager:  [phone]

## 2023-04-30 ENCOUNTER — Telehealth: Payer: BLUE CROSS/BLUE SHIELD | Admitting: Neurology

## 2023-04-30 ENCOUNTER — Encounter: Payer: Self-pay | Admitting: Neurology

## 2023-04-30 MED ORDER — EMGALITY 120 MG/ML ~~LOC~~ SOSY: 120.0000 mg | PREFILLED_SYRINGE | SUBCUTANEOUS | 1 refills | Status: DC

## 2023-04-30 NOTE — Telephone Encounter (Signed)
We had to reschedule today's VV due to MD being out of office. Pt is now scheduled for 11/6 @ 3:00 pm. Pt stated she is out of Emgality refills and is requesting one be sent in while she waits for next appt.

## 2023-04-30 NOTE — Addendum Note (Signed)
Addended by: Bertram Savin on: 04/30/2023 08:30 AM   Modules accepted: Orders

## 2023-04-30 NOTE — Telephone Encounter (Signed)
done

## 2023-05-06 ENCOUNTER — Other Ambulatory Visit: Payer: Self-pay | Admitting: Family Medicine

## 2023-05-06 DIAGNOSIS — Z1231 Encounter for screening mammogram for malignant neoplasm of breast: Secondary | ICD-10-CM

## 2023-05-15 DIAGNOSIS — G4733 Obstructive sleep apnea (adult) (pediatric): Secondary | ICD-10-CM | POA: Diagnosis not present

## 2023-06-14 DIAGNOSIS — G4733 Obstructive sleep apnea (adult) (pediatric): Secondary | ICD-10-CM | POA: Diagnosis not present

## 2023-06-24 ENCOUNTER — Telehealth: Payer: BLUE CROSS/BLUE SHIELD | Admitting: Neurology

## 2023-06-24 DIAGNOSIS — G43709 Chronic migraine without aura, not intractable, without status migrainosus: Secondary | ICD-10-CM

## 2023-06-24 NOTE — Progress Notes (Signed)
[facility]    Provider:  Dr Lucia Gaskins Requesting Provider: McLean-Scocuzza, French Ana * Primary Care Provider:  Dana Allan, MD  CC:  migraines  Virtual Visit via Video Note  I connected with Valerie Colon on 06/26/23 at  3:00 PM EST by a video enabled telemedicine application and verified that I am speaking with the correct person using two identifiers.  Location: Patient: home Provider: office   I discussed the limitations of evaluation and management by telemedicine and the availability of in person appointments. The patient expressed understanding and agreed to proceed.   I discussed the assessment and treatment plan with the patient. The patient was provided an opportunity to ask questions and all were answered. The patient agreed with the plan and demonstrated an understanding of the instructions.   The patient was advised to call back or seek an in-person evaluation if the symptoms worsen or if the condition fails to improve as anticipated.  I provided 20 minutes of non-face-to-face time during this encounter.   Anson Fret, MD  June 24, 2023: Interval update, this is a patient we know well, she has been coming to our office for several years, they first saw her she was having daily migraines, she had tried multiple medications and failed them, Aimovig was contraindicated due to constipation, we tried Ajovy but had to switch to [employer] due to insurance.  Likely she has been doing extremely well on Emgality and since starting that she only has 4-5 migraine days a month and less than 10 total headache days a month which is a tremendous improvement, and since she failed multiple triptans we started her on Ubrelvy acutely for the remainder of the migraines which helps at onset within an hour.  I discussed with patient that her treatment plan appears to be working, we discussed if we should make any changes in what those changes would be, since she is doing so  well I do not see any reason to change her management, she appears to be happy on this, she would like to follow-up with [place] in a year.  The last time we refilled her Emgality we gave her the injections, she would like to make sure that we prescribed the autoinjector which I will do.  She has Bernita Raisin when she needs a new prescription she can request it and we will refill.  Today I will give her Emgality autoinjector.  She already has a co-pay card on file and knows to get another co-pay card the beginning of the year.  Patient complains of symptoms per HPI as well as the following symptoms: none . Pertinent negatives and positives per HPI. All others negative   04/24/2021:  She is still doing well on [place] and [employer]. At baseline she had daily migraines and on the Emgality reduced to 4 migraine days a month and <7 total headache days a month in which she uses [place]. She has to redo her sleep study, she is on the process, she had a hx of sleep apnea before she lost weiht and was waking with a mild headache and fatigue is being retested otherwise No other focal neurologic deficits, associated symptoms, inciting events or modifiable factors.  Patient complains of symptoms per HPI as well as the following symptoms: fatigue, hx of sleep apnea . Pertinent negatives and positives per HPI. All others negative   06/02/2021: Bernita Raisin is helping. Discussed preventative. Side effects to topiramate. Tried verapamil, gabapentin, topiramate(not helping), nortriptyline.  She has episodic migraines,  less than 8 migraine days a month, the Bernita Raisin is helping, none of the other medications have helped, she has tried multiple classes, Ajovy is approved for episodic migraines we will see if we will get it approved or she can use the co-pay card. Cont ubrelvy.    Meds ordered this encounter  Medications   Galcanezumab-gnlm (EMGALITY) 120 MG/ML SOAJ    Sig: Inject 120 mg into the skin every 30 (thirty) days.    Dispense:   3 mL    Refill:  11    If approved by insurance, please dispense 42-month supply.  Otherwise dispense a 1 month supply.     HPI:  [name] is a 52 y.o. female here as requested by McLean-Scocuzza, French Ana * for headaches.   Since the end of April  she ws having this intense pressure in the back of her head, never had anything before, started abruptly and severe, worst headache of life severe and acute and intense pressure, all across the back and radiating down the neck, associated neck pain and tightness. She went to the ED and a migraine and had a CT of the head. Her pcp tried to order an MRI of the brain. For the last month getting pain on the left side of the head, associated with nausea, dizziness, throbbing/pulsating, nausea, no vision changes, worse with movement, happens every day.  If severe gets blurry vision. She had sleep apnea in the past and lost weight. But feels rested and not waking with with headaches, she is on the computer, she wears blu light blocker to help with the light sensitivity. Sumatriptan did not help at all, headaches can get worse positionally, Not exertional, no aura, they are daily. Ondansetron for nausea helps. Uncle has headaches. No medication overuse. Has had hysterectomy. Can last all day. No other focal neurologic deficits, associated symptoms, inciting events or modifiable factors.  Reviewed notes, labs and imaging from outside physicians, which showed:  01/03/2021: TSH normal, BUN 22 and cr 1.14  CT HEAD WITHOUT CONTRAST   TECHNIQUE: Contiguous axial images were obtained from the base of the skull through the vertex without intravenous contrast.   COMPARISON:  None.   FINDINGS: Brain: No evidence of acute infarction, hemorrhage, hydrocephalus, extra-axial collection or mass lesion/mass effect.   Vascular: No hyperdense vessel or unexpected calcification.   Skull: Normal. Negative for fracture or focal lesion.   Sinuses/Orbits: Visualized  paranasal sinuses and mastoid air cells are predominantly clear. Pneumatized petrous apices. Orbits are grossly unremarkable.   Other: None   IMPRESSION: No acute intracranial findings. Personally reviewed and agree.   Review of Systems: Patient complains of symptoms per HPI as well as the following symptoms headaches all others neg. Pertinent negatives and positives per HPI. All others negative.   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 [employer] in [place]   Married to significant other >as of 11/08/19 separated as of 01/31/22 dating partner lives in [place]      [place] [facility]    Wears seatbelt    Has gun at home    Safe in relationship          Caffeine: none   Right handed   Lives by herself   Social Determinants of Health   Financial Resource Strain: Not on file  Food Insecurity: Not on file  Transportation Needs: Not on file  Physical Activity: Not on file  Stress: Not on file  Social Connections: Not on file  Intimate Partner Violence: Not on file    Family History  Problem Relation Age of Onset   Breast cancer Mother 33   Diabetes Mother    Hyperlipidemia Mother    Hypertension Mother    Osteoporosis Mother        ? if has this    Prostate cancer Father    Hyperlipidemia Father    Hypertension Father    Depression Father    Multiple sclerosis Sister    Diabetes Sister    Osteoporosis  Maternal Aunt    Heart disease Maternal Grandmother    Kidney Stones Maternal Grandfather    Stroke Maternal Grandfather    Cancer Maternal Grandfather        prostate cancer    Cancer Other        breast cancer    Breast cancer Other    Kidney disease Neg Hx    Bladder Cancer Neg Hx     Past Medical History:  Diagnosis Date   Acid reflux    Allergic rhinitis    Allergy    ANA positive 05/16/2020   Dr. Dierdre Forth rheumatology seen in the past for elevated ANA obtained notes and ENA panel negative and negative autoimmune etiology as of 2021    Annual physical exam 05/06/2019   Anxiety    Benign cyst of right kidney 05/16/2020   CT renal 09/30/2018   Adrenals bilaterally appear unremarkable.  There is a cyst arising from the lateral mid to lower portion right  kidney measuring 1.2 x 1.2 cm. There is no appreciable  hydronephrosis on either side. There is a calculus in the lower pole  of the right kidney measuring 5 x 5 mm. There is a calculus in the  mid left kidney measuring 1 mm. 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 Acarbose prescribed.    Hypotension 11/13/2017   Increased frequency of headaches 10/02/2017   Intermittent palpitations 11/09/2015   Kidney stones    Mass of finger    myxoid neurofibroma multiple   Migraine    Migraine without aura and without  status migrainosus, not intractable 10/09/2017   Nephrolithiasis 02/29/2016  CT 09/30/2018   Adrenals bilaterally appear unremarkable.  There is a cyst arising from the lateral mid to lower portion right  kidney measuring 1.2 x 1.2 cm. There is no appreciable  hydronephrosis on either side. There is a calculus in the lower pole  of the right kidney measuring 5 x 5 mm. There is a calculus in the  mid left kidney measuring 1 mm. 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   Hypersomnia 12/11/2022   Iron deficiency 11/30/2022   Menopausal symptoms 08/22/2022   Lipid screening 08/20/2022   OSA (obstructive sleep apnea) 05/02/2022   Chronic migraine without aura without status migrainosus, not intractable 04/24/2022   Abnormal thyroid function test 01/10/2021   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   Hiatal hernia 05/16/2020   Impingement syndrome of shoulder region 01/27/2019   Osteoarthritis of knee 01/27/2019   Cyst of left ovary 05/07/2018   Breast calcifications 10/09/2017   Chronic fatigue 01/24/2016   Gastroesophageal reflux disease without esophagitis 01/24/2016   Vitamin D deficiency 07/09/2015   Allergic rhinitis 03/29/2015    Past Surgical History:  Procedure Laterality Date   ABDOMINAL HYSTERECTOMY     2008; left ovary still intact   BREAST BIOPSY Right 12/19/2013   stereo right- neg   CHOLECYSTECTOMY     COLONOSCOPY WITH PROPOFOL N/A 05/05/2016   Procedure: COLONOSCOPY WITH PROPOFOL;  Surgeon: Christena Deem, MD;  Location: [facility] ENDOSCOPY;  Service: Endoscopy;  Laterality: N/A;   ESOPHAGOGASTRODUODENOSCOPY  (EGD) WITH PROPOFOL N/A 05/05/2016   Procedure: ESOPHAGOGASTRODUODENOSCOPY (EGD) WITH PROPOFOL;  Surgeon: Christena Deem, MD;  Location[facility] ENDOSCOPY;  Service: Endoscopy;  Laterality: N/A;   EXTRACORPOREAL SHOCK WAVE LITHOTRIPSY Left 11/06/2016   Procedure: EXTRACORPOREAL SHOCK WAVE LITHOTRIPSY (ESWL);  Surgeon: Hildred Laser, MD;  Location: [facility] ORS;  Service: Urology;  Laterality: Left;   gallbladder  2012   GASTRIC BYPASS  2012   REDUCTION MAMMAPLASTY Bilateral    2010    Current Outpatient Medications  Medication Sig Dispense Refill   Galcanezumab-gnlm (EMGALITY) 120 MG/ML SOAJ Inject 120 mg into the skin every 30 (thirty) days. 3 mL 11   CALCIUM PO Take by mouth.     ibuprofen (ADVIL) 600 MG tablet Take 1 tablet by mouth 3 (three) times daily.     Iron, Ferrous Sulfate, 325 (65 Fe) MG TABS Take 325 mg by mouth every other day. 180 tablet 1   metFORMIN (GLUCOPHAGE) 500 MG tablet Take 500 mg by mouth 2 (two) times daily.     modafinil (PROVIGIL) 200 MG tablet Take 1 tablet (200 mg total) by mouth daily. 30 tablet 3   Multiple Vitamin (MULTIVITAMIN) tablet Take 1 tablet by mouth daily.     ondansetron (ZOFRAN ODT) 4 MG disintegrating tablet Take 1 tablet (4 mg total) by mouth every 8 (eight) hours as needed for nausea or vomiting. 30 tablet 6   pantoprazole (PROTONIX) 40 MG tablet Take 40 mg by mouth daily as needed.     Ubrogepant (UBRELVY) 100 MG TABS Take 100 mg by mouth every 2 (two) hours as needed (migraine.). Take as  close to onset of migraine as possible. DO NOT EXCEED 200 MG IN 24 HOURS. 16 tablet 9   VEOZAH 45 MG TABS Take 1 tablet by mouth daily.     ZEPBOUND 5 MG/0.5ML Pen Inject into the skin.     No current facility-administered medications for this visit.    Allergies as of 06/24/2023   (No Known Allergies)    Vitals: LMP  (LMP Unknown) Comment: neg upt Last Weight:  Wt Readings from Last 1 Encounters:  04/24/23 163 lb 9.6 oz (74.2 kg)   Last Height:    Ht Readings from Last 1 Encounters:  04/24/23 5\' 5"  (1.651 m)     Physical exam: Exam: Gen: NAD, conversant      CV: No palpitations or chest pain or SOB. VS: Breathing at a normal rate. Weight appears within normal limits. Not febrile. Eyes: Conjunctivae clear without exudates or hemorrhage  Neuro: Detailed Neurologic Exam  Speech:    Speech is normal; fluent and spontaneous with normal comprehension.  Cognition:    The patient is oriented to person, place, and time;     recent and remote memory intact;     language fluent;     normal attention, concentration, fund of knowledge Cranial Nerves:    The pupils are equal, round, and reactive to light. Visual fields are full Extraocular movements are intact.  The face is symmetric with normal sensation. The palate elevates in the midline. Hearing intact. Voice is normal. Shoulder shrug is normal. The tongue has normal motion without fasciculations.   Coordination: normal  Gait:    No abnormalities noted or reported  Motor Observation:   no involuntary movements noted. Tone:    Appears normal  Posture:    Posture is normal. normal erect    Strength:    Strength is anti-gravity and symmetric in the upper and lower limbs.      Sensation: intact to LT, no reports of numbness or tingling or paresthesias          Assessment/Plan:  She is still doing well on [place] and Emgality. At baseline she had daily migraines and on the Emgality reduced to 4 migraine days a month and <7 total headache days a month in which she uses [place].   Chronic migraines: refill emgality, make sure is autoinjector Acute management: Bernita RaisinBernita Raisin helps. Failed sumatriptan, rizatriptan Please take one tablet at the onset of your headache. If it does not improve the symptoms please take one additional tablet.  Sent note to staff to call for follow up  - MRI brain 02/06/2021 unremarkable.   - after further discussion she has tried  nortriptyline in the past, verapamil, gabapentin, topamax did not tolerate or help. Tried Ajovy had to switch to [employer] due to insurance, aimovig contraindicated due to constipation, Bernita Raisin helps.    Meds ordered this encounter  Medications   Galcanezumab-gnlm (EMGALITY) 120 MG/ML SOAJ    Sig: Inject 120 mg into the skin every 30 (thirty) days.    Dispense:  3 mL    Refill:  11    If approved by insurance, please dispense 70-month supply.  Otherwise dispense a 1 month supply.     Discussed: To prevent or relieve headaches, try the following: Cool Compress. Lie down and place a cool compress on your head.  Avoid headache triggers. If certain foods or odors seem to have triggered your migraines in the past, avoid them. A headache diary might help you identify triggers.  Include physical  activity in your daily routine. Try a daily walk or other moderate aerobic exercise.  Manage stress. Find healthy ways to cope with the stressors, such as delegating tasks on your to-do list.  Practice relaxation techniques. Try deep breathing, yoga, massage and visualization.  Eat regularly. Eating regularly scheduled meals and maintaining a healthy diet might help prevent headaches. Also, drink plenty of fluids.  Follow a regular sleep schedule. Sleep deprivation might contribute to headaches Consider biofeedback. With this mind-body technique, you learn to control certain bodily functions -- such as muscle tension, heart rate and blood pressure -- to prevent headaches or reduce headache pain.    Proceed to emergency room if you experience new or worsening symptoms or symptoms do not resolve, if you have new neurologic symptoms or if headache is severe, or for any concerning symptom.   Provided education and documentation from American headache Society toolbox including articles on: chronic migraine medication overuse headache, chronic migraines, prevention of migraines, behavioral and other  nonpharmacologic treatments for headache.   Cc: McLean-Scocuzza, Rushie Goltz, MD  Naomie Dean, MD  [facility] [address] [place], [place] [address]  Phone [phone] Fax [phone]

## 2023-06-26 ENCOUNTER — Encounter: Payer: Self-pay | Admitting: Neurology

## 2023-06-26 ENCOUNTER — Telehealth: Payer: Self-pay | Admitting: Neurology

## 2023-06-26 ENCOUNTER — Ambulatory Visit
Admission: RE | Admit: 2023-06-26 | Discharge: 2023-06-26 | Disposition: A | Discharge status: Home / Self Care | Payer: BC Managed Care – PPO | Source: Ambulatory Visit | Attending: Family Medicine | Admitting: Family Medicine

## 2023-06-26 DIAGNOSIS — Z1231 Encounter for screening mammogram for malignant neoplasm of breast: Secondary | ICD-10-CM | POA: Diagnosis not present

## 2023-06-26 MED ORDER — EMGALITY 120 MG/ML ~~LOC~~ SOAJ
120.0000 mg | SUBCUTANEOUS | 11 refills | Status: DC
Start: 2023-06-26 — End: 2024-07-05

## 2023-06-26 NOTE — Telephone Encounter (Signed)
Call for follow-up 1 year for med refill can be within NP and video she has been very stable on [place].

## 2023-06-29 NOTE — Telephone Encounter (Signed)
Pt scheduled for video visit with Megan for 07/05/24 at 3pm. Pt would like to know if she can have the auto injector instead of the syringe for [employer]

## 2023-06-29 NOTE — Telephone Encounter (Signed)
Autoinjector was ordered, not syringe.

## 2023-07-13 DIAGNOSIS — G4733 Obstructive sleep apnea (adult) (pediatric): Secondary | ICD-10-CM | POA: Diagnosis not present

## 2023-07-15 DIAGNOSIS — G4733 Obstructive sleep apnea (adult) (pediatric): Secondary | ICD-10-CM | POA: Diagnosis not present

## 2023-07-28 ENCOUNTER — Other Ambulatory Visit (HOSPITAL_COMMUNITY): Payer: Self-pay

## 2023-08-01 MED ORDER — MODAFINIL 200 MG PO TABS: 200.0000 mg | ORAL_TABLET | Freq: Every day | ORAL | 3 refills | Status: DC

## 2023-08-01 NOTE — Telephone Encounter (Signed)
Rx sent and message to patient .

## 2023-08-01 NOTE — Telephone Encounter (Signed)
Valerie Colon, please see mychart message about refill request and advise.

## 2023-08-12 DIAGNOSIS — G4733 Obstructive sleep apnea (adult) (pediatric): Secondary | ICD-10-CM | POA: Diagnosis not present

## 2023-08-24 ENCOUNTER — Ambulatory Visit: Payer: BC Managed Care – PPO | Admitting: Adult Health

## 2023-08-24 ENCOUNTER — Encounter: Payer: Self-pay | Admitting: Adult Health

## 2023-08-24 VITALS — BP 104/70 | HR 65 | Ht 65.0 in | Wt 165.6 lb

## 2023-08-24 DIAGNOSIS — G4733 Obstructive sleep apnea (adult) (pediatric): Secondary | ICD-10-CM

## 2023-08-24 DIAGNOSIS — G4700 Insomnia, unspecified: Secondary | ICD-10-CM

## 2023-08-24 DIAGNOSIS — G4711 Idiopathic hypersomnia with long sleep time: Secondary | ICD-10-CM

## 2023-08-24 MED ORDER — MODAFINIL 200 MG PO TABS: 200.0000 mg | ORAL_TABLET | Freq: Every day | ORAL | 3 refills | Status: DC

## 2023-08-24 NOTE — Patient Instructions (Addendum)
 Continue on Provigil  200mg  daily in am .  Continue on CPAP at bedtime goal is to wear all night long for at least 6 or more hours Healthy sleep regimen Activity as tolerated Do not drive if sleepy Work on healthy weight loss Follow-up with Dr. Neda or Korynne Dols in 6 months and As needed  (30 min slot)

## 2023-08-24 NOTE — Progress Notes (Signed)
 @Patient  ID: Valerie Colon, female    DOB: [birth year], 53 y.o.   MRN: [id number]  Chief Complaint  Patient presents with   Follow-up    Referring provider: Hope Merle, MD  HPI: Discussed the use of AI scribe software for clinical note transcription with the patient, who gave verbal consent to proceed.  53 year old female followed for sleep apnea with idiopathic hypersomnia Medical history significant for obesity status post gastric bypass surgery, migraine headaches  TEST/EVENTS :  02/08/23 CPAP titration study followed by a multi sleep latency test.  Titration study showed optimal control on CPAP 10 cm H2O.  Multi sleep latency test was negative for narcolepsy.  Showed some pathological sleep.  And idiopathic hypersomnia.  Total number of naps attempted with 5.  Total number of naps obtained was 5.  Main sleep latency was 3 minutes.  No sleep onset REM..   08/24/23 Follow up : OSA , Hypersomnia  Patient presents for 48-month follow-up.  Patient is followed for obstructive sleep apnea on nocturnal CPAP.  Patient has idiopathic hypersomnia.  She remains on Provigil  200 mg daily.  Patient has had perceived benefit with decreased daytime sleepiness with the addition of CPAP and Provigil .  Last visit patient was having some increased sleepiness towards the end of the day.  We adjusted her Provigil  daytime dosing to later in the morning.  Now she reports difficulty falling asleep despite regular use of their CPAP machine. The patient is currently on Provigil  (modafinil ) for daytime sleepiness, which they take around 9:30 AM.  CPAP download shows 80% compliance.  Daily average usage of 5 hours.  Patient is on auto CPAP 10 to 15 cm H2O.  AHI 1.0/hour daily average pressure at 12.4 cm H2O.  Patient says when she is having trouble going to sleep it is hard for her to keep her CPAP on.  She denies any increased headaches.  Denies any chest pain, palpitations, shortness of breath. She is continue to work  on weight loss.      No Known Allergies  Immunization History  Administered Date(s) Administered   Influenza, Seasonal, Injecte, Preservative Fre 04/24/2023   Influenza,inj,Quad PF,6+ Mos 06/05/2017, 05/07/2018, 05/06/2019, 05/16/2020   Influenza,inj,quad, With Preservative 06/05/2017   Influenza-Unspecified 04/30/2012, 06/13/2015, 05/01/2021, 07/18/2022   Moderna Covid-19 Vaccine Bivalent Booster 2yrs & up 05/01/2021   Moderna SARS-COV2 Booster Vaccination 07/30/2020, 07/18/2022   Moderna Sars-Covid-2 Vaccination 11/01/2019, 11/29/2019, 12/02/2019, 07/27/2020   Tdap 01/24/2016, 01/16/2018   Zoster Recombinant(Shingrix) 11/16/2021, 01/16/2022    Past Medical History:  Diagnosis Date   Acid reflux    Allergic rhinitis    Allergy    ANA positive 05/16/2020   Dr. Mai rheumatology seen in the past for elevated ANA obtained notes and ENA panel negative and negative autoimmune etiology as of 2021    Annual physical exam 05/06/2019   Anxiety    Benign cyst of right kidney 05/16/2020   CT renal 09/30/2018   Adrenals bilaterally appear unremarkable.  There is a cyst arising from the lateral mid to lower portion right  kidney measuring 1.2 x 1.2 cm. There is no appreciable  hydronephrosis on either side. There is a calculus in the lower pole  of the right kidney measuring 5 x 5 mm. There is a calculus in the  mid left kidney measuring 1 mm. There is no appreciable ureteral  calculu   Benign neoplasm of soft tissues of left upper extremity 11/23/2017   Cardiac murmur 02/12/2018   Chronic anemia  01/08/2015   Chronic fatigue 01/24/2016   Chronic gastritis 10/09/2017   Erosion noted anastamosis EGD 04/2016    Chronic pain of both knees 10/09/2017   COVID-19    09/2020   Dizziness 01/24/2016   Emotional stress 11/09/2015   Epigastric abdominal pain 11/13/2017   Family history of osteoporosis 05/07/2018   GAD (generalized anxiety disorder) 11/20/2015   Heart murmur    History of hiatal  hernia    Hypoglycemia after GI (gastrointestinal) surgery 11/26/2012   Overview:   Followed at bariatric center. Acarbose prescribed.    Hypotension 11/13/2017   Increased frequency of headaches 10/02/2017   Intermittent palpitations 11/09/2015   Kidney stones    Mass of finger    myxoid neurofibroma multiple   Migraine    Migraine without aura and without status migrainosus, not intractable 10/09/2017   Nephrolithiasis 02/29/2016   CT 09/30/2018   Adrenals bilaterally appear unremarkable.  There is a cyst arising from the lateral mid to lower portion right  kidney measuring 1.2 x 1.2 cm. There is no appreciable  hydronephrosis on either side. There is a calculus in the lower pole  of the right kidney measuring 5 x 5 mm. There is a calculus in the  mid left kidney measuring 1 mm. There is no appreciable ureteral  calculus on e   Obstructive apnea 01/20/2014   Ovarian cyst, left    Pelvic pain 10/09/2017   Postprandial hypoglycemia 02/12/2018   Shoulder pain, right 10/09/2017   Snoring 07/31/2017   Soft tissue lesion of elbow region 10/09/2017   Sprain of ligament of tarsometatarsal joint 04/01/2016    Tobacco History: Social History   Tobacco Use  Smoking Status Never   Passive exposure: Past  Smokeless Tobacco Never   Counseling given: Not Answered   Outpatient Medications Prior to Visit  Medication Sig Dispense Refill   CALCIUM PO Take by mouth.     Galcanezumab -gnlm (EMGALITY ) 120 MG/ML SOAJ Inject 120 mg into the skin every 30 (thirty) days. 3 mL 11   ibuprofen  (ADVIL ) 600 MG tablet Take 1 tablet by mouth 3 (three) times daily.     Iron , Ferrous Sulfate , 325 (65 Fe) MG TABS Take 325 mg by mouth every other day. 180 tablet 1   metFORMIN (GLUCOPHAGE) 500 MG tablet Take 500 mg by mouth 2 (two) times daily.     modafinil  (PROVIGIL ) 200 MG tablet Take 1 tablet (200 mg total) by mouth daily. 30 tablet 3   Multiple Vitamin (MULTIVITAMIN) tablet Take 1 tablet by mouth daily.      ondansetron  (ZOFRAN  ODT) 4 MG disintegrating tablet Take 1 tablet (4 mg total) by mouth every 8 (eight) hours as needed for nausea or vomiting. 30 tablet 6   pantoprazole  (PROTONIX ) 40 MG tablet Take 40 mg by mouth daily as needed.     Ubrogepant  (UBRELVY ) 100 MG TABS Take 100 mg by mouth every 2 (two) hours as needed (migraine.). Take as close to onset of migraine as possible. DO NOT EXCEED 200 MG IN 24 HOURS. 16 tablet 9   VEOZAH 45 MG TABS Take 1 tablet by mouth daily.     ZEPBOUND 5 MG/0.5ML Pen Inject into the skin.     No facility-administered medications prior to visit.     Review of Systems:   Constitutional:   No  weight loss, night sweats,  Fevers, chills, +fatigue, or  lassitude.  HEENT:   No headaches,  Difficulty swallowing,  Tooth/dental problems, or  Sore throat,  No sneezing, itching, ear ache, nasal congestion, post nasal drip,   CV:  No chest pain,  Orthopnea, PND, swelling in lower extremities, anasarca, dizziness, palpitations, syncope.   GI  No heartburn, indigestion, abdominal pain, nausea, vomiting, diarrhea, change in bowel habits, loss of appetite, bloody stools.   Resp: No shortness of breath with exertion or at rest.  No excess mucus, no productive cough,  No non-productive cough,  No coughing up of blood.  No change in color of mucus.  No wheezing.  No chest wall deformity  Skin: no rash or lesions.  GU: no dysuria, change in color of urine, no urgency or frequency.  No flank pain, no hematuria   MS:  No joint pain or swelling.  No decreased range of motion.  No back pain.    Physical Exam  BP 104/70 (BP Location: Left Arm, Patient Position: Sitting, Cuff Size: Normal)   Pulse 65   Ht 5' 5 (1.651 m)   Wt 165 lb 9.6 oz (75.1 kg)   LMP  (LMP Unknown) Comment: neg upt  SpO2 100%   BMI 27.56 kg/m   GEN: A/Ox3; pleasant , NAD, well nourished    HEENT:  [place]/AT,   NOSE-clear, THROAT-clear, no lesions, no postnasal drip or exudate  noted.   NECK:  Supple w/ fair ROM; no JVD; normal carotid impulses w/o bruits; no thyromegaly or nodules palpated; no lymphadenopathy.    RESP  Clear  P & A; w/o, wheezes/ rales/ or rhonchi. no accessory muscle use, no dullness to percussion  CARD:  RRR, no m/r/g, no peripheral edema, pulses intact, no cyanosis or clubbing.  GI:   Soft & nt; nml bowel sounds; no organomegaly or masses detected.   Musco: Warm bil, no deformities or joint swelling noted.   Neuro: alert, no focal deficits noted.    Skin: Warm, no lesions or rashes    Lab Results:  CBC    BNP No results found for: BNP  ProBNP No results found for: PROBNP  Imaging: No results found.  Administration History     None           No data to display          No results found for: NITRICOXIDE      Assessment & Plan:  Assessment and Plan    Obstructive Sleep Apnea-patient has perceived benefit on CPAP but is having difficulty using due to insomnia.  We will adjust her Provigil  daytime dosing.  To see if we can help with this.  Healthy sleep regimen discussed..  If insomnia persist could consider decreasing Provigil  dose. We discussed the importance of consistent CPAP use to prevent sleep deprivation and daytime sleepiness, with a goal to increase usage to 6-7 hours per night for effective management. We will encourage consistent CPAP usage every night and aim for 6-7 hours of CPAP usage per night.  Insomnia- They experience bouts of insomnia despite CPAP use, possibly due to the timing of Provigil  (modafinil ) intake at 9:30 AM, which may interfere with nighttime sleep. We emphasized balancing Provigil 's benefits for daytime alertness with its impact on nighttime sleep. We will adjust Provigil  intake to 8:00 AM, limit caffeine  intake after lunchtime, encourage physical activity during the day, including hourly movement at work, recommend morning exposure to natural light, advise reducing screen  time before bed, consider stress-relief activities such as stretching, yoga, or meditation, and monitor and provide feedback on sleep patterns.  Daytime Sleepiness-idiopathic hypersomnia. They are on Provigil  (  modafinil ) for daytime sleepiness, with perceived benefit.  We discussed potential dose adjustment if insomnia persists. We will refill the Provigil  prescription and consider reducing the Provigil  dose if insomnia persists.  General Health Maintenance Continue follow-up with primary care..  Follow-up Follow-up in 6 months and as needed        Madelin Stank, NP 08/24/2023

## 2023-09-04 ENCOUNTER — Other Ambulatory Visit: Payer: Self-pay | Admitting: Neurology

## 2023-09-04 DIAGNOSIS — G43009 Migraine without aura, not intractable, without status migrainosus: Secondary | ICD-10-CM

## 2023-09-07 NOTE — Telephone Encounter (Signed)
Rx refilled per last office visit note.

## 2023-09-12 DIAGNOSIS — G4733 Obstructive sleep apnea (adult) (pediatric): Secondary | ICD-10-CM | POA: Diagnosis not present

## 2023-09-22 DIAGNOSIS — M67442 Ganglion, left hand: Secondary | ICD-10-CM | POA: Diagnosis not present

## 2023-10-12 DIAGNOSIS — G4733 Obstructive sleep apnea (adult) (pediatric): Secondary | ICD-10-CM | POA: Diagnosis not present

## 2023-10-15 DIAGNOSIS — G4733 Obstructive sleep apnea (adult) (pediatric): Secondary | ICD-10-CM | POA: Diagnosis not present

## 2023-11-09 DIAGNOSIS — G4733 Obstructive sleep apnea (adult) (pediatric): Secondary | ICD-10-CM | POA: Diagnosis not present

## 2023-12-02 ENCOUNTER — Other Ambulatory Visit (HOSPITAL_COMMUNITY): Payer: Self-pay

## 2023-12-03 ENCOUNTER — Ambulatory Visit: Payer: BC Managed Care – PPO | Admitting: Family Medicine

## 2023-12-03 ENCOUNTER — Encounter: Payer: Self-pay | Admitting: Family Medicine

## 2023-12-03 VITALS — BP 100/60 | HR 67 | Temp 97.6°F | Resp 20 | Ht 65.0 in | Wt 156.0 lb

## 2023-12-03 DIAGNOSIS — E538 Deficiency of other specified B group vitamins: Secondary | ICD-10-CM

## 2023-12-03 DIAGNOSIS — E559 Vitamin D deficiency, unspecified: Secondary | ICD-10-CM | POA: Diagnosis not present

## 2023-12-03 DIAGNOSIS — Z0000 Encounter for general adult medical examination without abnormal findings: Secondary | ICD-10-CM

## 2023-12-03 DIAGNOSIS — L680 Hirsutism: Secondary | ICD-10-CM

## 2023-12-03 DIAGNOSIS — Z13220 Encounter for screening for lipoid disorders: Secondary | ICD-10-CM | POA: Diagnosis not present

## 2023-12-03 DIAGNOSIS — R946 Abnormal results of thyroid function studies: Secondary | ICD-10-CM

## 2023-12-03 DIAGNOSIS — E611 Iron deficiency: Secondary | ICD-10-CM | POA: Diagnosis not present

## 2023-12-03 DIAGNOSIS — R7309 Other abnormal glucose: Secondary | ICD-10-CM

## 2023-12-03 DIAGNOSIS — Z7689 Persons encountering health services in other specified circumstances: Secondary | ICD-10-CM

## 2023-12-03 DIAGNOSIS — F39 Unspecified mood [affective] disorder: Secondary | ICD-10-CM

## 2023-12-03 DIAGNOSIS — Z1231 Encounter for screening mammogram for malignant neoplasm of breast: Secondary | ICD-10-CM

## 2023-12-03 DIAGNOSIS — K219 Gastro-esophageal reflux disease without esophagitis: Secondary | ICD-10-CM

## 2023-12-03 DIAGNOSIS — N951 Menopausal and female climacteric states: Secondary | ICD-10-CM

## 2023-12-03 NOTE — Progress Notes (Signed)
 SUBJECTIVE:   Chief Complaint  Patient presents with   Annual Exam   HPI Presents for annual visit  Discussed the use of AI scribe software for clinical note transcription with the patient, who gave verbal consent to proceed.  History of Present Illness Valerie Colon is a 53 year old female who presents for an annual physical exam.  She experiences intermittent chest pain over the past two months, attributed to work-related anxiety. The pain is a dull ache, lasting a few minutes, occurring periodically, primarily at work or on the way to work. There is no sharp pain, radiation, or occurrence during physical activity. She has a history of similar symptoms in the past, related to work stress. No shortness of breath or swelling is noted.  She has a history of low iron  levels and is not currently taking iron  supplements, although she takes a multivitamin. She plans to resume iron  supplementation if her levels are found to be low again. Blood work is planned to reassess her iron  levels.  She is currently taking several medications: Veozah, which she finds effective; Emgality  injections; metformin twice daily; modafinil ; Zofran  as needed for nausea; and Zepbound, managed through telehealth [facility]. She also has a prescription for Protonix , used periodically for acid reflux, a condition improved since her gastric bypass surgery.  She experiences facial hair growth, which she finds bothersome. She recalls a previous hormone workup by another provider but is unsure of the details. She has a history of ovarian cysts and has one ovary remaining. Her sister also experiences issues with facial hair.    PERTINENT PMH / PSH: As above  OBJECTIVE:  BP 100/60   Pulse 67   Temp 97.6 F (36.4 C)   Resp 20   Ht 5\' 5"  (1.651 m)   Wt 156 lb (70.8 kg)   LMP  (LMP Unknown) Comment: neg upt  SpO2 99%   BMI 25.96 kg/m    Physical Exam Vitals reviewed.  Constitutional:      General: She is  not in acute distress.    Appearance: Normal appearance. She is normal weight. She is not ill-appearing, toxic-appearing or diaphoretic.  Eyes:     General:        Right eye: No discharge.        Left eye: No discharge.     Conjunctiva/sclera: Conjunctivae normal.  Cardiovascular:     Rate and Rhythm: Normal rate and regular rhythm.     Heart sounds: Normal heart sounds.  Pulmonary:     Effort: Pulmonary effort is normal.     Breath sounds: Normal breath sounds.  Abdominal:     General: Bowel sounds are normal.  Musculoskeletal:        General: Normal range of motion.  Skin:    General: Skin is warm and dry.  Neurological:     General: No focal deficit present.     Mental Status: She is alert and oriented to person, place, and time. Mental status is at baseline.  Psychiatric:        Mood and Affect: Mood normal.        Behavior: Behavior normal.        Thought Content: Thought content normal.        Judgment: Judgment normal.           12/03/2023    8:32 AM 11/26/2022   10:18 AM 08/05/2022    1:17 PM 06/11/2021    1:52 PM 11/09/2020  1:46 PM  Depression screen PHQ 2/9  Decreased Interest 0 0 0 0 0  Down, Depressed, Hopeless 0 0 0 0 0  PHQ - 2 Score 0 0 0 0 0  Altered sleeping 0  0  0  Tired, decreased energy 0  1  0  Change in appetite 0  0  0  Feeling bad or failure about yourself  0  0  0  Trouble concentrating 0  0  0  Moving slowly or fidgety/restless 0  0  0  Suicidal thoughts 0  0  0  PHQ-9 Score 0  1  0  Difficult doing work/chores Not difficult at all  Not difficult at all  Not difficult at all      12/03/2023    8:32 AM 11/26/2022   10:18 AM 08/05/2022    1:17 PM 11/09/2020    1:46 PM  GAD 7 : Generalized Anxiety Score  Nervous, Anxious, on Edge 1 0 0 0  Control/stop worrying 0 0 0 0  Worry too much - different things 0  0 0  Trouble relaxing 0 0 0 0  Restless 0 0 0 0  Easily annoyed or irritable 0 0 0 0  Afraid - awful might happen 0 0 0 0   Total GAD 7 Score 1  0 0  Anxiety Difficulty Not difficult at all Not difficult at all Not difficult at all Not difficult at all    ASSESSMENT/PLAN:  Annual physical exam Assessment & Plan: Routine health maintenance discussed. Mammogram due November 2025. Routine blood work needed. Recommend regular self breast exams Vaccines up to date Depression screening negative    Vitamin D  deficiency Assessment & Plan: Chronic Check Vitamin D  level Continue vitamin D  supplements  Orders: -     VITAMIN D  25 Hydroxy (Vit-D Deficiency, Fractures); Future  Lipid screening -     Lipid panel; Future  Iron  deficiency Assessment & Plan: Low iron  levels, no supplementation. Iron  levels to be re-evaluated. - Check iron  levels with routine blood work. - Consider restarting iron  supplements if levels are low.  Orders: -     Comprehensive metabolic panel with GFR; Future -     CBC with Differential/Platelet; Future -     IBC + Ferritin; Future  Vitamin B 12 deficiency -     Vitamin B12; Future  Abnormal glucose -     Hemoglobin A1c; Future  Hirsutism Assessment & Plan: Facial hair growth that has been ongoing for few years.  Previous cortisol, DHEAS, Testosterone  levels normal - Patient requesting cortisol levels - Recommend she see dermatology  Orders: -     TSH; Future  Breast cancer screening by mammogram -     3D Screening Mammogram, Left and Right; Future  Gastroesophageal reflux disease without esophagitis Assessment & Plan: Managed with Protonix  prn   Abnormal thyroid  function test Assessment & Plan: Chronic.  Stable.  Asymptomatic Repeat TSH annually    Seen in weight management clinic Assessment & Plan: On Zepbound for weight management, effective with insurance coverage. On metformin.  Managed by NP at weight loss clinic   Menopausal symptoms Assessment & Plan: Was prescribed NK3 antagonist [facility].  Takes Vezoha 45 mg daily. Will  defer continued management to [facility] provider   Mood disorder [facility]) Assessment & Plan: Intermittent chest pain linked to anxiety, likely stress-induced. Prefers as-needed medication due to concerns about SSRIs. - Prescribe Buspar 5 mg as needed for anxiety, up  to twice a day.  Orders: -     busPIRone HCl; Take 1 tablet (5 mg total) by mouth 2 (two) times daily.  Dispense: 60 tablet; Refill: 1          PDMP reviewed  Return if symptoms worsen or fail to improve, for PCP.  Valli Gaw, MD

## 2023-12-03 NOTE — Patient Instructions (Addendum)
 It was a pleasure meeting you today. Thank you for allowing me to take part in your health care.  Our goals for today as we discussed include:  Schedule fasting lab appointment.  Fast 10 hours  Referral sent for Mammogram. Please call to schedule appointment. [facility] [address] [place], [place] [address] [phone]      This is a list of the screening recommended for you and due dates:  Health Maintenance  Topic Date Due   Pap with HPV screening  08/10/2022   COVID-19 Vaccine (8 - 2024-25 season) 04/19/2023   Flu Shot  03/18/2024   Mammogram  06/25/2024   DTaP/Tdap/Td vaccine (3 - Td or Tdap) 01/17/2028   Colon Cancer Screening  09/13/2031   Zoster (Shingles) Vaccine  Completed   HPV Vaccine  Aged Out   Meningitis B Vaccine  Aged Out   Hepatitis C Screening  Discontinued   HIV Screening  Discontinued     If you have any questions or concerns, please do not hesitate to call the office at [phone].  I look forward to our next visit and until then take care and stay safe.  Regards,   Valli Gaw, MD   [facility]

## 2023-12-08 ENCOUNTER — Encounter: Payer: Self-pay | Admitting: Neurology

## 2023-12-10 ENCOUNTER — Other Ambulatory Visit (HOSPITAL_COMMUNITY): Payer: Self-pay

## 2023-12-10 ENCOUNTER — Telehealth: Payer: Self-pay

## 2023-12-10 DIAGNOSIS — G4733 Obstructive sleep apnea (adult) (pediatric): Secondary | ICD-10-CM | POA: Diagnosis not present

## 2023-12-10 NOTE — Telephone Encounter (Signed)
 Pharmacy Patient Advocate Encounter   Received notification from CoverMyMeds that prior authorization for Emgality  120MG /ML auto-injectors (migraine) is required/requested.   Insurance verification completed.   The patient is insured through [facility] .   Per test claim: PA required; PA started via CoverMyMeds. KEY [id number] . Waiting for clinical questions to populate.

## 2023-12-11 ENCOUNTER — Encounter: Payer: Self-pay | Admitting: Family Medicine

## 2023-12-11 ENCOUNTER — Other Ambulatory Visit (INDEPENDENT_AMBULATORY_CARE_PROVIDER_SITE_OTHER)

## 2023-12-11 ENCOUNTER — Other Ambulatory Visit: Payer: Self-pay | Admitting: Family Medicine

## 2023-12-11 DIAGNOSIS — E559 Vitamin D deficiency, unspecified: Secondary | ICD-10-CM

## 2023-12-11 DIAGNOSIS — L680 Hirsutism: Secondary | ICD-10-CM

## 2023-12-11 DIAGNOSIS — R7309 Other abnormal glucose: Secondary | ICD-10-CM

## 2023-12-11 DIAGNOSIS — E538 Deficiency of other specified B group vitamins: Secondary | ICD-10-CM | POA: Diagnosis not present

## 2023-12-11 DIAGNOSIS — Z13220 Encounter for screening for lipoid disorders: Secondary | ICD-10-CM

## 2023-12-11 DIAGNOSIS — E611 Iron deficiency: Secondary | ICD-10-CM | POA: Diagnosis not present

## 2023-12-11 LAB — COMPREHENSIVE METABOLIC PANEL WITH GFR
ALT: 28 U/L (ref 0–35)
AST: 30 U/L (ref 0–37)
Albumin: 4 g/dL (ref 3.5–5.2)
Alkaline Phosphatase: 76 U/L (ref 39–117)
BUN: 14 mg/dL (ref 6–23)
CO2: 30 meq/L (ref 19–32)
Calcium: 9.2 mg/dL (ref 8.4–10.5)
Chloride: 104 meq/L (ref 96–112)
Creatinine, Ser: 1.04 mg/dL (ref 0.40–1.20)
GFR: 61.93 mL/min (ref >60.00)
Glucose, Bld: 81 mg/dL (ref 70–99)
Potassium: 4 meq/L (ref 3.5–5.1)
Sodium: 140 meq/L (ref 135–145)
Total Bilirubin: 0.4 mg/dL (ref 0.2–1.2)
Total Protein: 7.6 g/dL (ref 6.0–8.3)

## 2023-12-11 LAB — CBC WITH DIFFERENTIAL/PLATELET
Basophils Absolute: 0 10*3/uL (ref 0.0–0.1)
Basophils Relative: 0.4 % (ref 0.0–3.0)
Eosinophils Absolute: 0.1 10*3/uL (ref 0.0–0.7)
Eosinophils Relative: 1.2 % (ref 0.0–5.0)
HCT: 39.2 % (ref 36.0–46.0)
Hemoglobin: 13.1 g/dL (ref 12.0–15.0)
Lymphocytes Relative: 47.7 % — ABNORMAL HIGH (ref 12.0–46.0)
Lymphs Abs: 2 10*3/uL (ref 0.7–4.0)
MCHC: 33.3 g/dL (ref 30.0–36.0)
MCV: 94.4 fl (ref 78.0–100.0)
Monocytes Absolute: 0.3 10*3/uL (ref 0.1–1.0)
Monocytes Relative: 7 % (ref 3.0–12.0)
Neutro Abs: 1.8 10*3/uL (ref 1.4–7.7)
Neutrophils Relative %: 43.7 % (ref 43.0–77.0)
Platelets: 266 10*3/uL (ref 150.0–400.0)
RBC: 4.15 Mil/uL (ref 3.87–5.11)
RDW: 13.1 % (ref 11.5–15.5)
WBC: 4.2 10*3/uL (ref 4.0–10.5)

## 2023-12-11 LAB — IBC + FERRITIN
Ferritin: 56.5 ng/mL (ref 10.0–291.0)
Iron: 128 ug/dL (ref 42–145)
Saturation Ratios: 35.7 % (ref 20.0–50.0)
TIBC: 358.4 ug/dL (ref 250.0–450.0)
Transferrin: 256 mg/dL (ref 212.0–360.0)

## 2023-12-11 LAB — TSH: TSH: 1.09 u[IU]/mL (ref 0.35–5.50)

## 2023-12-11 LAB — HEMOGLOBIN A1C: Hgb A1c MFr Bld: 5.8 % (ref 4.6–6.5)

## 2023-12-11 LAB — VITAMIN B12: Vitamin B-12: 1157 pg/mL — ABNORMAL HIGH (ref 211–911)

## 2023-12-11 LAB — LIPID PANEL
Cholesterol: 175 mg/dL (ref 0–200)
HDL: 87.4 mg/dL (ref >39.00)
LDL Cholesterol: 78 mg/dL (ref 0–99)
NonHDL: 87.87
Total CHOL/HDL Ratio: 2
Triglycerides: 50 mg/dL (ref 0.0–149.0)
VLDL: 10 mg/dL (ref 0.0–40.0)

## 2023-12-11 LAB — VITAMIN D 25 HYDROXY (VIT D DEFICIENCY, FRACTURES): VITD: 65.31 ng/mL (ref 30.00–100.00)

## 2023-12-11 NOTE — Addendum Note (Signed)
 Addended by: Thressa Flora D on: 12/11/2023 03:23 PM   Modules accepted: Orders

## 2023-12-12 LAB — CORTISOL: Cortisol: 8.1 ug/dL (ref 6.2–19.4)

## 2023-12-13 ENCOUNTER — Encounter: Payer: Self-pay | Admitting: Family Medicine

## 2023-12-13 DIAGNOSIS — E669 Obesity, unspecified: Secondary | ICD-10-CM | POA: Insufficient documentation

## 2023-12-13 DIAGNOSIS — F39 Unspecified mood [affective] disorder: Secondary | ICD-10-CM | POA: Insufficient documentation

## 2023-12-13 DIAGNOSIS — R7309 Other abnormal glucose: Secondary | ICD-10-CM | POA: Insufficient documentation

## 2023-12-13 DIAGNOSIS — E538 Deficiency of other specified B group vitamins: Secondary | ICD-10-CM | POA: Insufficient documentation

## 2023-12-13 DIAGNOSIS — Z7689 Persons encountering health services in other specified circumstances: Secondary | ICD-10-CM | POA: Insufficient documentation

## 2023-12-13 DIAGNOSIS — Z1231 Encounter for screening mammogram for malignant neoplasm of breast: Secondary | ICD-10-CM | POA: Insufficient documentation

## 2023-12-13 DIAGNOSIS — Z0000 Encounter for general adult medical examination without abnormal findings: Secondary | ICD-10-CM | POA: Insufficient documentation

## 2023-12-13 DIAGNOSIS — L680 Hirsutism: Secondary | ICD-10-CM | POA: Insufficient documentation

## 2023-12-13 MED ORDER — BUSPIRONE HCL 5 MG PO TABS: 5.0000 mg | ORAL_TABLET | Freq: Two times a day (BID) | ORAL | 1 refills | Status: DC

## 2023-12-13 NOTE — Assessment & Plan Note (Signed)
 Chronic Check Vitamin D  level Continue vitamin D  supplements

## 2023-12-13 NOTE — Assessment & Plan Note (Signed)
 Chronic.  Stable.  Asymptomatic Repeat TSH annually

## 2023-12-13 NOTE — Assessment & Plan Note (Signed)
 On Zepbound for weight management, effective with insurance coverage. On metformin.  Managed by NP at weight loss clinic

## 2023-12-13 NOTE — Assessment & Plan Note (Signed)
 Intermittent chest pain linked to anxiety, likely stress-induced. Prefers as-needed medication due to concerns about SSRIs. - Prescribe Buspar 5 mg as needed for anxiety, up to twice a day.

## 2023-12-13 NOTE — Assessment & Plan Note (Signed)
 Low iron  levels, no supplementation. Iron  levels to be re-evaluated. - Check iron  levels with routine blood work. - Consider restarting iron  supplements if levels are low.

## 2023-12-13 NOTE — Assessment & Plan Note (Signed)
 Managed with Protonix  prn

## 2023-12-13 NOTE — Assessment & Plan Note (Signed)
Was prescribed NK3 antagonist [facility].  Takes Vezoha 45 mg daily. Will defer continued management to [facility] provider

## 2023-12-13 NOTE — Assessment & Plan Note (Signed)
 Routine health maintenance discussed. Mammogram due November 2025. Routine blood work needed. Recommend regular self breast exams Vaccines up to date Depression screening negative

## 2023-12-13 NOTE — Assessment & Plan Note (Addendum)
 Facial hair growth that has been ongoing for few years.  Previous cortisol, DHEAS, Testosterone  levels normal - Patient requesting cortisol levels - Recommend she see dermatology

## 2023-12-16 NOTE — Telephone Encounter (Signed)
 Previous PA expired-Will call insurance. When I tried to resubmit got a denial from [facility]

## 2023-12-22 ENCOUNTER — Other Ambulatory Visit (HOSPITAL_COMMUNITY): Payer: Self-pay

## 2023-12-22 NOTE — Telephone Encounter (Signed)
 Pharmacy Patient Advocate Encounter   Received notification from CoverMyMeds that prior authorization for Emgality  is required/requested.   Insurance verification completed.   The patient is insured through [facility] .   Per test claim: PA required; PA submitted to above mentioned insurance via CoverMyMeds Key/confirmation #/EOC [id number] Status is pending

## 2023-12-23 ENCOUNTER — Other Ambulatory Visit (HOSPITAL_COMMUNITY): Payer: Self-pay

## 2023-12-23 NOTE — Telephone Encounter (Signed)
 Pharmacy Patient Advocate Encounter  Received notification from [facility] that Prior Authorization for Emgality  120MG /ML auto-injectors (migraine) has been APPROVED from 12/22/2023 to 12/21/2024. Ran test claim, Copay is $35.00. This test claim was processed through [facility]- copay amounts may vary at other pharmacies due to pharmacy/plan contracts, or as the patient moves through the different stages of their insurance plan.   PA #/Case ID/Reference #: PA#[employer] [id number]

## 2024-01-12 DIAGNOSIS — G4733 Obstructive sleep apnea (adult) (pediatric): Secondary | ICD-10-CM | POA: Diagnosis not present

## 2024-02-12 DIAGNOSIS — G4733 Obstructive sleep apnea (adult) (pediatric): Secondary | ICD-10-CM | POA: Diagnosis not present

## 2024-02-23 ENCOUNTER — Other Ambulatory Visit: Payer: Self-pay | Admitting: Adult Health

## 2024-02-23 NOTE — Telephone Encounter (Signed)
**Note De-identified  [place] Obfuscation** Please advise 

## 2024-02-26 ENCOUNTER — Ambulatory Visit: Admitting: Pulmonary Disease

## 2024-02-26 ENCOUNTER — Telehealth: Payer: Self-pay

## 2024-02-26 ENCOUNTER — Other Ambulatory Visit (HOSPITAL_COMMUNITY): Payer: Self-pay

## 2024-02-26 NOTE — Telephone Encounter (Signed)
*  Pulm renewal  Pharmacy Patient Advocate Encounter   Received notification from CoverMyMeds that prior authorization for Modafinil  200MG  tablets  is required/requested.   Insurance verification completed.   The patient is insured through [facility] .   Per test claim: PA required; PA submitted to above mentioned insurance via CoverMyMeds Key/confirmation #/EOC [id number] Status is pending

## 2024-02-26 NOTE — Telephone Encounter (Signed)
 Approved today by [facility] NCPDP 2017 Your PA request has been approved. Additional information will be provided in the approval communication. (Message 1145) Effective Date: 02/26/2024 Authorization Expiration Date: 02/25/2025

## 2024-03-02 ENCOUNTER — Ambulatory Visit (INDEPENDENT_AMBULATORY_CARE_PROVIDER_SITE_OTHER): Admitting: Pulmonary Disease

## 2024-03-02 VITALS — BP 104/62 | HR 71 | Ht 65.0 in | Wt 161.8 lb

## 2024-03-02 DIAGNOSIS — G4711 Idiopathic hypersomnia with long sleep time: Secondary | ICD-10-CM

## 2024-03-02 DIAGNOSIS — G4733 Obstructive sleep apnea (adult) (pediatric): Secondary | ICD-10-CM

## 2024-03-02 MED ORDER — MODAFINIL 100 MG PO TABS
ORAL_TABLET | ORAL | 2 refills | Status: DC
Start: 2024-03-02 — End: 2024-07-04

## 2024-03-02 NOTE — Progress Notes (Signed)
 Valerie Colon    [id number]    [birth year]  Primary Care Physician:No primary care provider on file.  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 but with significant sleepiness - Has been using Provigil - She has concerns about feeling sleepy on a drive home from work Takes Provigil  about 8:30 in the morning, despite this on her way driving home from work she may feel quite sleepy sometimes  She continues to stay active  Denies any fevers, no chills No significant changes in her health since the last time she was seen  Past history of gastric bypass surgery, past history of migraine headaches  Last sleep study was in 2024, titrated CPAP of 10, MSLT with sleep latency of 3 minutes with no sleep onset REM  Outpatient Encounter Medications as of 03/02/2024  Medication Sig   busPIRone  (BUSPAR ) 5 MG tablet Take 1 tablet (5 mg total) by mouth 2 (two) times daily.   CALCIUM PO Take by mouth.   Galcanezumab -gnlm (EMGALITY ) 120 MG/ML SOAJ Inject 120 mg into the skin every 30 (thirty) days.   ibuprofen  (ADVIL ) 600 MG tablet Take 1 tablet by mouth 3 (three) times daily.   Iron , Ferrous Sulfate , 325 (65 Fe) MG TABS Take 325 mg by mouth every other day.   Loratadine 10 MG CAPS Take 1 capsule by mouth daily.   metFORMIN (GLUCOPHAGE) 500 MG tablet Take 500 mg by mouth 2 (two) times daily.   modafinil  (PROVIGIL ) 100 MG tablet Modafinil  200 mg in the morning, 100 mg early afternoon   modafinil  (PROVIGIL ) 200 MG tablet Take 1 tablet (200 mg total) by mouth daily.   Multiple Vitamin (MULTIVITAMIN) tablet Take 1 tablet by mouth daily.   ondansetron  (ZOFRAN   ODT) 4 MG disintegrating tablet Take 1 tablet (4 mg total) by mouth every 8 (eight) hours as needed for nausea or vomiting.   pantoprazole  (PROTONIX ) 40 MG tablet Take 40 mg by mouth daily as needed.   UBRELVY  100 MG TABS TAKE 1 TABLET BY MOUTH EVERY 2 HRS AS NEEDED FOR MIGRAINE AS CLOSE TO ONSET AS POSSIBLE. DO NOT TAKE MORE THAN 2 TABLET IN 24 HRS   VEOZAH 45 MG TABS Take 1 tablet by mouth daily.   Vitamin D -Vitamin K (VITAMIN K2-VITAMIN D3 PO) Take 1 capsule by mouth daily.   ZEPBOUND 5 MG/0.5ML Pen Inject into the skin.   No facility-administered encounter medications on file as of 03/02/2024.    Allergies as of 03/02/2024   (No Known Allergies)    Past Medical History:  Diagnosis Date   Acid reflux    Allergic rhinitis    Allergy    ANA positive 05/16/2020   Dr. Mai rheumatology seen in the past for elevated ANA obtained notes and ENA panel negative and negative autoimmune etiology as of 2021    Annual physical exam 05/06/2019   Anxiety    Benign cyst of right kidney 05/16/2020   CT renal 09/30/2018   Adrenals bilaterally appear unremarkable.  There is a cyst arising from the lateral mid to lower portion right  kidney measuring 1.2 x 1.2 cm. There is no appreciable  hydronephrosis  on either side. There is a calculus in the lower pole  of the right kidney measuring 5 x 5 mm. There is a calculus in the  mid left kidney measuring 1 mm. There is no appreciable ureteral  calculu   Benign neoplasm of soft tissues of left upper extremity 11/23/2017   Cardiac murmur 02/12/2018   Chronic anemia 01/08/2015   Chronic fatigue 01/24/2016   Chronic gastritis 10/09/2017   Erosion noted anastamosis EGD 04/2016    Chronic pain of both knees 10/09/2017   COVID-19    09/2020   Dizziness 01/24/2016   Emotional stress 11/09/2015   Epigastric abdominal pain 11/13/2017   Family history of osteoporosis 05/07/2018   GAD (generalized anxiety disorder) 11/20/2015   Heart murmur    History of hiatal  hernia    Hypoglycemia after GI (gastrointestinal) surgery 11/26/2012   Overview:   Followed at bariatric center. Acarbose prescribed.    Hypotension 11/13/2017   Increased frequency of headaches 10/02/2017   Intermittent palpitations 11/09/2015   Kidney stones    Mass of finger    myxoid neurofibroma multiple   Migraine    Migraine without aura and without status migrainosus, not intractable 10/09/2017   Nephrolithiasis 02/29/2016   CT 09/30/2018   Adrenals bilaterally appear unremarkable.  There is a cyst arising from the lateral mid to lower portion right  kidney measuring 1.2 x 1.2 cm. There is no appreciable  hydronephrosis on either side. There is a calculus in the lower pole  of the right kidney measuring 5 x 5 mm. There is a calculus in the  mid left kidney measuring 1 mm. There is no appreciable ureteral  calculus on e   Obstructive apnea 01/20/2014   Ovarian cyst, left    Pelvic pain 10/09/2017   Postprandial hypoglycemia 02/12/2018   Shoulder pain, right 10/09/2017   Sleep apnea 07/18/2022   Snoring 07/31/2017   Soft tissue lesion of elbow region 10/09/2017   Sprain of ligament of tarsometatarsal joint 04/01/2016    Past Surgical History:  Procedure Laterality Date   ABDOMINAL HYSTERECTOMY     2008; left ovary still intact   BREAST BIOPSY Right 12/19/2013   stereo right- neg   CHOLECYSTECTOMY     COLONOSCOPY WITH PROPOFOL  N/A 05/05/2016   Procedure: COLONOSCOPY WITH PROPOFOL ;  Surgeon: Gladis RAYMOND Mariner, MD;  Location[facility] ENDOSCOPY;  Service: Endoscopy;  Laterality: N/A;   ESOPHAGOGASTRODUODENOSCOPY (EGD) WITH PROPOFOL  N/A 05/05/2016   Procedure: ESOPHAGOGASTRODUODENOSCOPY (EGD) WITH PROPOFOL ;  Surgeon: Gladis RAYMOND Mariner, MD;  Location: [facility] ENDOSCOPY;  Service: Endoscopy;  Laterality: N/A;   EXTRACORPOREAL SHOCK WAVE LITHOTRIPSY Left 11/06/2016   Procedure: EXTRACORPOREAL SHOCK WAVE LITHOTRIPSY (ESWL);  Surgeon: Redell Lynwood Napoleon, MD;  Location: [facility] ORS;  Service:  Urology;  Laterality: Left;   gallbladder  2012   GASTRIC BYPASS  2012   HERNIA REPAIR     REDUCTION MAMMAPLASTY Bilateral    2010    Family History  Problem Relation Age of Onset   Breast cancer Mother 45   Diabetes Mother    Hyperlipidemia Mother    Hypertension Mother    Osteoporosis Mother        ? if has this    Cancer Mother    Prostate cancer Father    Hyperlipidemia Father    Hypertension Father    Depression Father    Multiple sclerosis Sister    Diabetes Sister    Osteoporosis Maternal Aunt    Heart disease Maternal Grandmother  Kidney Stones Maternal Grandfather    Stroke Maternal Grandfather    Cancer Maternal Grandfather        prostate cancer    Cancer Other        breast cancer    Breast cancer Other    Kidney disease Neg Hx    Bladder Cancer Neg Hx     Social History   Socioeconomic History   Marital status: Divorced    Spouse name: Not on file   Number of children: Not on file   Years of education: Not on file   Highest education level: Not on file  Occupational History   Not on file  Tobacco Use   Smoking status: Never    Passive exposure: Past   Smokeless tobacco: Never  Substance and Sexual Activity   Alcohol use: Never   Drug use: Never   Sexual activity: Yes  Other Topics Concern   Not on file  Social History Narrative   Bachelors    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 for [employer] in [place]   Married to significant other >as of 11/08/19 separated as of 01/31/22 dating partner lives in [place]       [place] [facility]    Wears seatbelt    Has gun at home    Safe in relationship          Caffeine : none   Right handed   Lives by herself   Social Drivers of Corporate investment banker Strain: Not on file  Food Insecurity: Not on file  Transportation Needs: Not on file  Physical Activity: Not on file  Stress: Not on file  Social Connections: Not on file  Intimate Partner Violence: Not on file    Review of  Systems  Respiratory:  Positive for apnea.   Psychiatric/Behavioral:  Positive for sleep disturbance.     Vitals:   03/02/24 1602  BP: 104/62  Pulse: 71  SpO2: 98%     Physical Exam Constitutional:      Appearance: Normal appearance.  HENT:     Head: Normocephalic.     Mouth/Throat:     Mouth: Mucous membranes are moist.  Eyes:     General: No scleral icterus. Cardiovascular:     Rate and Rhythm: Normal rate and regular rhythm.     Heart sounds: No murmur heard.    No friction rub.  Pulmonary:     Effort: No respiratory distress.     Breath sounds: No stridor. No wheezing or rhonchi.  Musculoskeletal:     Cervical back: No rigidity or tenderness.  Neurological:     Mental Status: She is alert.  Psychiatric:        Mood and Affect: Mood normal.    Data Reviewed: Sleep study was reviewed  MSLT results reviewed  CPAP compliance reviewed showing 54% compliance AutoSet 10-15 with EPR of 3 Residual AHI of 1.1 - Compliance is lower as she states she sometimes falls asleep before putting the machine on  Assessment:  Obstructive sleep apnea - On CPAP therapy - Tolerating CPAP well - Benefit from CPAP use  Idiopathic hypersomnolence - On modafinil  - Benefiting from modafinil  - Does have some daytime sleepiness with concerns about safe driving - Will continue modafinil  in the morning and take a lower dose in the middle of the day  Plan/Recommendations: Prescription for modafinil  200 in morning and 100 mg at night  Encouraged to use CPAP nightly  Encouraged to use CPAP nightly  Encouraged to try and ensure that she gets at least 6 to 7 hours of sleep every night  Follow-up in about 3 months       Jennet Epley MD [facility] 03/02/2024, 4:59 PM  CC: Hope Merle, MD

## 2024-03-02 NOTE — Patient Instructions (Signed)
 Continue using your CPAP on a nightly basis  Will place a prescription for your modafinil  200 mg in the morning and 100 mg about middle of the day  Follow-up in about 3 months  Call us  with significant concerns

## 2024-03-13 DIAGNOSIS — G4733 Obstructive sleep apnea (adult) (pediatric): Secondary | ICD-10-CM | POA: Diagnosis not present

## 2024-05-23 ENCOUNTER — Telehealth: Payer: Self-pay

## 2024-05-23 ENCOUNTER — Other Ambulatory Visit: Payer: Self-pay | Admitting: Internal Medicine

## 2024-05-23 DIAGNOSIS — Z1231 Encounter for screening mammogram for malignant neoplasm of breast: Secondary | ICD-10-CM

## 2024-05-23 NOTE — Telephone Encounter (Signed)
 Copied from CRM [id number]. Topic: Referral - Request for Referral >> May 23, 2024 11:22 AM Roselie BROCKS wrote: Did the patient discuss referral with their provider in the last year? Yes (If No - schedule appointment) (If Yes - send message)  Appointment offered? No  Type of order/referral and detailed reason for visit: Patient needs orders to schedule a mammogram   Preference of office, provider, location: [facility]   If referral order, have you been seen by this specialty before? Yes (If Yes, this issue or another issue? When? Where?  Can we respond through MyChart? Yes  I spoke with patient and she states [facility] told her that they cannot use the mammogram order placed by Dr. Glenys Ferrari.  Patient has a transfer of care appointment scheduled with Dr. Kalpana Bair on 08/05/2024.  Would Dr. Abbey be willing to put in an order for patient to have a mammogram?

## 2024-05-23 NOTE — Telephone Encounter (Signed)
 Sending to doc of day for approval. Pt needs a new order due to previous md no longer apart of our practice.

## 2024-05-23 NOTE — Telephone Encounter (Signed)
 Copied from CRM [id number]. Topic: Clinical - Request for Lab/Test Order >> May 23, 2024 10:48 AM Pinkey ORN wrote: Reason for CRM: Mammogram >> May 23, 2024 10:49 AM Pinkey ORN wrote: Patient is requesting an order to have her mammogram completed, states it needs to be completed no earlier than November 8th.

## 2024-06-03 ENCOUNTER — Ambulatory Visit: Admitting: Pulmonary Disease

## 2024-07-01 ENCOUNTER — Ambulatory Visit
Admission: RE | Admit: 2024-07-01 | Discharge: 2024-07-01 | Disposition: A | Discharge status: Home / Self Care | Source: Ambulatory Visit | Attending: Internal Medicine | Admitting: Internal Medicine

## 2024-07-01 DIAGNOSIS — Z1231 Encounter for screening mammogram for malignant neoplasm of breast: Secondary | ICD-10-CM | POA: Diagnosis not present

## 2024-07-04 ENCOUNTER — Encounter: Payer: Self-pay | Admitting: Adult Health

## 2024-07-04 ENCOUNTER — Ambulatory Visit (INDEPENDENT_AMBULATORY_CARE_PROVIDER_SITE_OTHER): Admitting: Adult Health

## 2024-07-04 VITALS — BP 112/73 | HR 66 | Temp 97.6°F | Ht 65.0 in | Wt 161.4 lb

## 2024-07-04 DIAGNOSIS — G4711 Idiopathic hypersomnia with long sleep time: Secondary | ICD-10-CM | POA: Diagnosis not present

## 2024-07-04 DIAGNOSIS — G4733 Obstructive sleep apnea (adult) (pediatric): Secondary | ICD-10-CM | POA: Diagnosis not present

## 2024-07-04 MED ORDER — MODAFINIL 100 MG PO TABS: ORAL_TABLET | ORAL | 5 refills | Status: AC

## 2024-07-04 NOTE — Patient Instructions (Signed)
 Continue on Provigil  200mg  daily in am and 1 early afternoon if needed  Continue on CPAP at bedtime goal is to wear all night long for at least 6 or more hours Healthy sleep regimen Activity as tolerated Do not drive if sleepy Work on healthy weight loss Follow-up with Dr. Neda or Random Dobrowski in 6 months and As needed

## 2024-07-04 NOTE — Progress Notes (Signed)
 @Patient  ID: Valerie Colon, female    DOB: [birth year], 53 y.o.   MRN: [id number]  Chief Complaint  Patient presents with   Obstructive Sleep Apnea    F/U    Referring provider: No ref. provider found  HPI: 53 year old female followed for obstructive sleep apnea with idiopathic hypersomnia Medical history significant for obesity status post gastric bypass surgery and migraine headaches    TEST/EVENTS : Reviewed 07/04/2024  02/08/23 CPAP titration study followed by a multi sleep latency test. Titration study showed optimal control on CPAP 10 cm H2O. Multi sleep latency test was negative for narcolepsy. Showed some pathological sleep. And idiopathic hypersomnia. Total number of naps attempted with 5. Total number of naps obtained was 5. Main sleep latency was 3 minutes. No sleep onset REM    Discussed the use of AI scribe software for clinical note transcription with the patient, who gave verbal consent to proceed.  History of Present Illness Valerie Colon is a 53 year old female with mild sleep apnea and hypersomnia who presents for a 17-month follow-up regarding CPAP use.  She experiences difficulties with consistent CPAP use, often falling asleep on the couch after work and sometimes removing the CPAP during the night. She wears the CPAP for only two to three hours on most nights, with only one instance of wearing it for more than six hours in the past month. She  continues to experience daytime grogginess and sleepiness. Has daytime hypersomnia on Provigil  daily . Last office visit provigil  was increased 200mg  daily in am and 1 early afternoon to help . Feels it is beneficial .   Weight remains stable similar to her weight during previous sleep studies done in 2023 and 2024. Previous sleep study showed mild OSA.   She has been diagnosed with hypersomnia. Previous MSLT was negative for narcolepsy. She experiences significant daytime sleepiness, requiring Provigil  to manage symptoms.  She takes two Provigil  tablets in the morning and one in the afternoon, which helps her stay awake, especially while driving home from work. Despite this, she reports an incident of nearly falling asleep at a stoplight two weeks ago.  She is not currently taking Buspar  for anxiety, although she has a prescription for it. She manages migraines with monthly Emgality  injections.  Her work involves being a production designer, theatre/television/film at [employer], which requires her to be at her desk frequently. She attempts to stay active by taking breaks and walking around during the day. No massive headaches and no significant issues with staying up at night.     No Known Allergies  Immunization History  Administered Date(s) Administered   Influenza Inj Mdck Quad Pf 07/08/2022   Influenza, Seasonal, Injecte, Preservative Fre 04/24/2023   Influenza,inj,Quad PF,6+ Mos 06/05/2017, 05/07/2018, 05/06/2019, 05/16/2020, 04/29/2021   Influenza,inj,quad, With Preservative 06/05/2017   Influenza-Unspecified 04/30/2012, 06/13/2015, 05/01/2021, 07/18/2022   MMR 06/11/2018   Moderna Covid-19 Fall Seasonal Vaccine 27yrs & older 07/08/2022   Moderna Covid-19 Vaccine Bivalent Booster 78yrs & up 04/29/2021   Moderna SARS-COV2 Booster Vaccination 07/30/2020, 07/18/2022   Moderna Sars-Covid-2 Vaccination 11/01/2019, 11/29/2019, 12/02/2019, 07/27/2020   Tdap 01/24/2016, 01/16/2018   Zoster Recombinant(Shingrix) 11/16/2021, 01/16/2022    Past Medical History:  Diagnosis Date   Acid reflux    Allergic rhinitis    Allergy    ANA positive 05/16/2020   Dr. Mai rheumatology seen in the past for elevated ANA obtained notes and ENA panel negative and negative autoimmune etiology as of 2021    Annual physical  exam 05/06/2019   Anxiety    Benign cyst of right kidney 05/16/2020   CT renal 09/30/2018   Adrenals bilaterally appear unremarkable.  There is a cyst arising from the lateral mid to lower portion right  kidney measuring 1.2 x 1.2 cm. There  is no appreciable  hydronephrosis on either side. There is a calculus in the lower pole  of the right kidney measuring 5 x 5 mm. There is a calculus in the  mid left kidney measuring 1 mm. 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 Acarbose prescribed.    Hypotension 11/13/2017   Increased frequency of headaches 10/02/2017   Intermittent palpitations 11/09/2015   Kidney stones    Mass of finger    myxoid neurofibroma multiple   Migraine    Migraine without aura and without status migrainosus, not intractable 10/09/2017   Nephrolithiasis 02/29/2016   CT 09/30/2018   Adrenals bilaterally appear unremarkable.  There is a cyst arising from the lateral mid to lower portion right  kidney measuring 1.2 x 1.2 cm. There is no appreciable  hydronephrosis on either side. There is a calculus in the lower pole  of the right kidney measuring 5 x 5 mm. There is a calculus in the  mid left kidney measuring 1 mm. There is no appreciable ureteral  calculus on e   Obstructive apnea 01/20/2014   Ovarian cyst, left    Pelvic pain 10/09/2017   Postprandial hypoglycemia 02/12/2018   Shoulder pain, right 10/09/2017   Sleep apnea 07/18/2022   Snoring 07/31/2017   Soft tissue lesion of elbow region 10/09/2017   Sprain of ligament of tarsometatarsal joint 04/01/2016    Tobacco History: Social  History   Tobacco Use  Smoking Status Never   Passive exposure: Past  Smokeless Tobacco Never   Counseling given: Not Answered   Outpatient Medications Prior to Visit  Medication Sig Dispense Refill   Galcanezumab -gnlm (EMGALITY ) 120 MG/ML SOAJ Inject 120 mg into the skin every 30 (thirty) days. 3 mL 11   Iron , Ferrous Sulfate , 325 (65 Fe) MG TABS Take 325 mg by mouth every other day. 180 tablet 1   Loratadine 10 MG CAPS Take 1 capsule by mouth daily.     modafinil  (PROVIGIL ) 100 MG tablet Modafinil  200 mg in the morning, 100 mg early afternoon 90 tablet 2   Multiple Vitamin (MULTIVITAMIN) tablet Take 1 tablet by mouth daily.     ondansetron  (ZOFRAN  ODT) 4 MG disintegrating tablet Take 1 tablet (4 mg total) by mouth every 8 (eight) hours as needed for nausea or vomiting. 30 tablet 6   pantoprazole  (PROTONIX ) 40 MG tablet Take 40 mg by mouth daily as needed.     UBRELVY  100 MG TABS TAKE 1 TABLET BY MOUTH EVERY 2 HRS AS NEEDED FOR MIGRAINE AS CLOSE TO ONSET AS POSSIBLE. DO  NOT TAKE MORE THAN 2 TABLET IN 24 HRS 16 tablet 9   VEOZAH 45 MG TABS Take 1 tablet by mouth daily.     Vitamin D -Vitamin K (VITAMIN K2-VITAMIN D3 PO) Take 1 capsule by mouth daily.     ZEPBOUND 5 MG/0.5ML Pen Inject into the skin.     busPIRone  (BUSPAR ) 5 MG tablet Take 1 tablet (5 mg total) by mouth 2 (two) times daily. (Patient not taking: Reported on 07/04/2024) 60 tablet 1   CALCIUM PO Take by mouth. (Patient not taking: Reported on 07/04/2024)     ibuprofen  (ADVIL ) 600 MG tablet Take 1 tablet by mouth 3 (three) times daily. (Patient not taking: Reported on 07/04/2024)     metFORMIN (GLUCOPHAGE) 500 MG tablet Take 500 mg by mouth 2 (two) times daily. (Patient not taking: Reported on 07/04/2024)     modafinil  (PROVIGIL ) 200 MG tablet Take 1 tablet (200 mg total) by mouth daily. (Patient not taking: Reported on 07/04/2024) 30 tablet 3   No facility-administered medications prior to visit.     Review of Systems:    Constitutional:   No  weight loss, night sweats,  Fevers, chills, +fatigue, or  lassitude.  HEENT:   No headaches,  Difficulty swallowing,  Tooth/dental problems, or  Sore throat,                No sneezing, itching, ear ache, nasal congestion, post nasal drip,   CV:  No chest pain,  Orthopnea, PND, swelling in lower extremities, anasarca, dizziness, palpitations, syncope.   GI  No heartburn, indigestion, abdominal pain, nausea, vomiting, diarrhea, change in bowel habits, loss of appetite, bloody stools.   Resp: No shortness of breath with exertion or at rest.  No excess mucus, no productive cough,  No non-productive cough,  No coughing up of blood.  No change in color of mucus.  No wheezing.  No chest wall deformity  Skin: no rash or lesions.  GU: no dysuria, change in color of urine, no urgency or frequency.  No flank pain, no hematuria   MS:  No joint pain or swelling.  No decreased range of motion.  No back pain.    Physical Exam  BP 112/73   Pulse 66   Temp 97.6 F (36.4 C)   Ht 5' 5 (1.651 m) Comment: Per pt  Wt 161 lb 6.4 oz (73.2 kg)   LMP  (LMP Unknown) Comment: neg upt  SpO2 100% Comment: RA  BMI 26.86 kg/m   GEN: A/Ox3; pleasant , NAD, well nourished    HEENT:  [place]/AT,  NOSE-clear, THROAT-clear, no lesions, no postnasal drip or exudate noted. Class 3 MP airway   NECK:  Supple w/ fair ROM; no JVD; normal carotid impulses w/o bruits; no thyromegaly or nodules palpated; no lymphadenopathy.    RESP  Clear  P & A; w/o, wheezes/ rales/ or rhonchi. no accessory muscle use, no dullness to percussion  CARD:  RRR, no m/r/g, no peripheral edema, pulses intact, no cyanosis or clubbing.  GI:   Soft & nt; nml bowel sounds; no organomegaly or masses detected.   Musco: Warm bil, no deformities or joint swelling noted.   Neuro: alert, no focal deficits noted.    Skin: Warm, no lesions or rashes    Lab Results:Reviewed 07/04/2024   CBC    Component Value Date/Time    WBC 4.2 12/11/2023 0932   RBC 4.15 12/11/2023 0932   HGB 13.1 12/11/2023 0932   HGB 11.4 04/21/2018  1045   HCT 39.2 12/11/2023 0932   HCT 34.7 04/21/2018 1045   PLT 266.0 12/11/2023 0932   PLT 251 04/21/2018 1045   MCV 94.4 12/11/2023 0932   MCV 95 04/21/2018 1045   MCH 31.4 12/29/2020 2100   MCHC 33.3 12/11/2023 0932   RDW 13.1 12/11/2023 0932   RDW 12.9 04/21/2018 1045   LYMPHSABS 2.0 12/11/2023 0932   LYMPHSABS 1.7 04/21/2018 1045   MONOABS 0.3 12/11/2023 0932   EOSABS 0.1 12/11/2023 0932   EOSABS 0.1 04/21/2018 1045   BASOSABS 0.0 12/11/2023 0932   BASOSABS 0.0 04/21/2018 1045    BMET    Component Value Date/Time   NA 140 12/11/2023 0932   NA 140 11/09/2020 1424   K 4.0 12/11/2023 0932   CL 104 12/11/2023 0932   CO2 30 12/11/2023 0932   GLUCOSE 81 12/11/2023 0932   BUN 14 12/11/2023 0932   BUN 27 (H) 11/09/2020 1424   CREATININE 1.04 12/11/2023 0932   CREATININE 1.45 (H) 05/06/2019 1550   CALCIUM 9.2 12/11/2023 0932   GFRNONAA >60 12/29/2020 2100   GFRAA >60 09/30/2018 0730    BNP No results found for: BNP  ProBNP No results found for: PROBNP  Imaging: No results found.  Administration History     None           No data to display          No results found for: NITRICOXIDE     07/04/2024    3:00 PM 02/20/2022    3:00 PM  Results of the [facility] flowsheet  Sitting and reading 2 3  Watching TV 3 3  Sitting, inactive in a public place (e.g. a theatre or a meeting) 2 2  As a passenger in a car for an hour without a break 2 3  Lying down to rest in the afternoon when circumstances permit 3 3  Sitting and talking to someone 1 1  Sitting quietly after a lunch without alcohol 2 2  In a car, while stopped for a few minutes in traffic 2 2  Total score 17 19        Assessment & Plan:   Assessment and Plan Assessment & Plan Mild obstructive sleep apnea  -poor adherence on CPAP  Confirmed by sleep studies in 2023 and April 2024.  CPAP therapy effectively eliminates sleep apnea when used, but adherence is poor, often less than six hours per night. Alternative treatments such as weight loss, positional therapy, and dental devices were discussed. A dental device is not recommended due to potential impact on existing crowns, and Inspire implant is unsuitable due to mild severity. CPAP remains the most effective treatment, though adherence is challenging. She is encouraged to use CPAP consistently for six or more hours per night, improve her sleep routine to avoid falling asleep on the couch, and consider a CPAP pillow for positional therapy. Continue weight management efforts.  Hypersomnia  -on going .  Has underlying untreated mild obstructive sleep apnea, with symptoms of daytime sleepiness and falling asleep during activities. Managed with Provigil , taking two capsules in the morning and one in the afternoon as needed, with no significant side effects.MSLT neg for narcolepsy. Provigil  prescription is refilled with the same instructions. Encouraged adherence to CPAP therapy to potentially improve hypersomnia symptoms. Advised to maintain an active lifestyle and take regular breaks from screen time.  Plan  Patient Instructions  Continue on Provigil  200mg  daily in am and 1 early afternoon if needed  Continue on CPAP at bedtime goal is to wear all night long for at least 6 or more hours Healthy sleep regimen Activity as tolerated Do not drive if sleepy Work on healthy weight loss Follow-up with Dr. Neda or Jude Linck in 6 months and As needed         I spent 31   minutes dedicated to the care of this patient on the date of this encounter to include pre-visit review of records, face-to-face time with the patient discussing conditions above, post visit ordering of testing, clinical documentation with the electronic health record, making appropriate referrals as documented, and communicating necessary findings to members of the  patients care team.    Madelin Stank, NP 07/04/2024  I

## 2024-07-05 ENCOUNTER — Ambulatory Visit: Payer: Self-pay | Admitting: Internal Medicine

## 2024-07-05 ENCOUNTER — Telehealth: Payer: BLUE CROSS/BLUE SHIELD | Admitting: Adult Health

## 2024-07-05 DIAGNOSIS — G43709 Chronic migraine without aura, not intractable, without status migrainosus: Secondary | ICD-10-CM

## 2024-07-05 DIAGNOSIS — G43009 Migraine without aura, not intractable, without status migrainosus: Secondary | ICD-10-CM

## 2024-07-05 DIAGNOSIS — R110 Nausea: Secondary | ICD-10-CM | POA: Diagnosis not present

## 2024-07-05 MED ORDER — UBRELVY 100 MG PO TABS: ORAL_TABLET | ORAL | 12 refills | Status: AC

## 2024-07-05 MED ORDER — ONDANSETRON 4 MG PO TBDP: 4.0000 mg | ORAL_TABLET | Freq: Three times a day (TID) | ORAL | 6 refills | Status: AC | PRN

## 2024-07-05 MED ORDER — EMGALITY 120 MG/ML ~~LOC~~ SOAJ: 120.0000 mg | SUBCUTANEOUS | 4 refills | Status: AC

## 2024-07-05 NOTE — Progress Notes (Signed)
 PATIENT: Valerie Colon DOB: Feb 20, 1971  REASON FOR VISIT: follow up HISTORY FROM: patient  Virtual Visit via Video Note  I connected with Valerie Colon on 07/05/24 at  3:00 PM EST by a video enabled telemedicine application located remotely at [facility] and verified that I am speaking with the correct person using two identifiers who was located at their own home in [place]    I discussed the limitations of evaluation and management by telemedicine and the availability of in person appointments. The patient expressed understanding and agreed to proceed.   PATIENT: Valerie Colon DOB: May 01, 1971  REASON FOR VISIT: follow up HISTORY FROM: patient  HISTORY OF PRESENT ILLNESS: Today 07/05/24:  Valerie Colon is a 53 y.o. female with a history of migraine headaches. Returns today for follow-up.  Overall she feels that she is doing relatively well.  She is having 2-3 headaches a month.  She currently has a headache and took Ubrelvy  this morning.  Had to take a second dose an hour or so ago.  Overall she feels that the combination of Emgality  and Ubrelvy  has been helpful.  She returns today for an evaluation.  Frequency: 2-3 months  Duration: ubrelvy -typically resolves fairly quickly although sometimes it can linger Aura: no Photophonia:yes  Phonophobia:no Nausea: yes  Vomiting: no Numbness: no Weakness: no Visual changes:no      HISTORY (copied from Dr. Sharion note) June 24, 2023: Interval update, this is a patient we know well, she has been coming to our office for several years, they first saw her she was having daily migraines, she had tried multiple medications and failed them, Aimovig was contraindicated due to constipation, we tried Ajovy  but had to switch to Emgality  due to insurance.  Likely she has been doing extremely well on Emgality  and since starting that she only has 4-5 migraine days a month and less than 10 total headache days  a month which is a tremendous improvement, and since she failed multiple triptans we started her on Ubrelvy  acutely for the remainder of the migraines which helps at onset within an hour.  I discussed with patient that her treatment plan appears to be working, we discussed if we should make any changes in what those changes would be, since she is doing so well I do not see any reason to change her management, she appears to be happy on this, she would like to follow-up with us  in a year.  The last time we refilled her Emgality  we gave her the injections, she would like to make sure that we prescribed the autoinjector which I will do.  She has Ubrelvy  when she needs a new prescription she can request it and we will refill.  Today I will give her Emgality  autoinjector.  She already has a co-pay card on file and knows to get another co-pay card the beginning of the year.    REVIEW OF SYSTEMS: Out of a complete 14 system review of symptoms, the patient complains only of the following symptoms, and all other reviewed systems are negative.  ALLERGIES: No Known Allergies  HOME MEDICATIONS: Outpatient Medications Prior to Visit  Medication Sig Dispense Refill   busPIRone  (BUSPAR ) 5 MG tablet Take 1 tablet (5 mg total) by mouth 2 (two) times daily. (Patient not taking: Reported on 07/04/2024) 60 tablet 1   CALCIUM PO Take by mouth. (Patient not taking: Reported on 07/04/2024)     ibuprofen  (ADVIL ) 600 MG tablet Take  1 tablet by mouth 3 (three) times daily. (Patient not taking: Reported on 07/04/2024)     Iron , Ferrous Sulfate , 325 (65 Fe) MG TABS Take 325 mg by mouth every other day. 180 tablet 1   Loratadine 10 MG CAPS Take 1 capsule by mouth daily.     metFORMIN (GLUCOPHAGE) 500 MG tablet Take 500 mg by mouth 2 (two) times daily. (Patient not taking: Reported on 07/04/2024)     modafinil  (PROVIGIL ) 100 MG tablet Modafinil  200 mg in the morning, 100 mg early afternoon 90 tablet 5   Multiple Vitamin  (MULTIVITAMIN) tablet Take 1 tablet by mouth daily.     pantoprazole  (PROTONIX ) 40 MG tablet Take 40 mg by mouth daily as needed.     VEOZAH 45 MG TABS Take 1 tablet by mouth daily.     Vitamin D -Vitamin K (VITAMIN K2-VITAMIN D3 PO) Take 1 capsule by mouth daily.     ZEPBOUND 5 MG/0.5ML Pen Inject into the skin.     Galcanezumab -gnlm (EMGALITY ) 120 MG/ML SOAJ Inject 120 mg into the skin every 30 (thirty) days. 3 mL 11   ondansetron  (ZOFRAN  ODT) 4 MG disintegrating tablet Take 1 tablet (4 mg total) by mouth every 8 (eight) hours as needed for nausea or vomiting. 30 tablet 6   UBRELVY  100 MG TABS TAKE 1 TABLET BY MOUTH EVERY 2 HRS AS NEEDED FOR MIGRAINE AS CLOSE TO ONSET AS POSSIBLE. DO NOT TAKE MORE THAN 2 TABLET IN 24 HRS 16 tablet 9   No facility-administered medications prior to visit.    PAST MEDICAL HISTORY: Past Medical History:  Diagnosis Date   Acid reflux    Allergic rhinitis    Allergy    ANA positive 05/16/2020   Dr. Mai rheumatology seen in the past for elevated ANA obtained notes and ENA panel negative and negative autoimmune etiology as of 2021    Annual physical exam 05/06/2019   Anxiety    Benign cyst of right kidney 05/16/2020   CT renal 09/30/2018   Adrenals bilaterally appear unremarkable.  There is a cyst arising from the lateral mid to lower portion right  kidney measuring 1.2 x 1.2 cm. There is no appreciable  hydronephrosis on either side. There is a calculus in the lower pole  of the right kidney measuring 5 x 5 mm. There is a calculus in the  mid left kidney measuring 1 mm. 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 Acarbose prescribed.    Hypotension 11/13/2017   Increased frequency of headaches 10/02/2017   Intermittent palpitations 11/09/2015   Kidney stones    Mass of finger    myxoid neurofibroma multiple   Migraine    Migraine without aura and without status migrainosus, not intractable 10/09/2017   Nephrolithiasis 02/29/2016   CT 09/30/2018   Adrenals bilaterally appear unremarkable.  There  is a cyst arising from the lateral mid to lower portion right  kidney measuring 1.2 x 1.2 cm. There is no appreciable  hydronephrosis on either side. There is a calculus in the lower pole  of the right kidney measuring 5 x 5 mm. There is a calculus in the  mid left kidney measuring 1 mm. There is no appreciable ureteral  calculus on e   Obstructive apnea 01/20/2014   Ovarian cyst, left    Pelvic pain 10/09/2017   Postprandial hypoglycemia 02/12/2018   Shoulder pain, right 10/09/2017   Sleep apnea 07/18/2022   Snoring 07/31/2017   Soft tissue lesion of elbow region 10/09/2017   Sprain of ligament of tarsometatarsal joint 04/01/2016    PAST SURGICAL HISTORY: Past Surgical History:  Procedure Laterality Date   ABDOMINAL HYSTERECTOMY     2008; left ovary still intact   BREAST BIOPSY Right 12/19/2013   stereo right- neg   CHOLECYSTECTOMY     COLONOSCOPY WITH PROPOFOL  N/A 05/05/2016   Procedure: COLONOSCOPY WITH PROPOFOL ;  Surgeon: Gladis RAYMOND Mariner, MD;  Location: [facility] ENDOSCOPY;  Service: Endoscopy;  Laterality: N/A;   ESOPHAGOGASTRODUODENOSCOPY (EGD) WITH PROPOFOL  N/A 05/05/2016   Procedure: ESOPHAGOGASTRODUODENOSCOPY (EGD) WITH PROPOFOL ;  Surgeon: Gladis RAYMOND Mariner, MD;  Location: [facility] ENDOSCOPY;  Service: Endoscopy;   Laterality: N/A;   EXTRACORPOREAL SHOCK WAVE LITHOTRIPSY Left 11/06/2016   Procedure: EXTRACORPOREAL SHOCK WAVE LITHOTRIPSY (ESWL);  Surgeon: Redell Lynwood Napoleon, MD;  Location: [facility] ORS;  Service: Urology;  Laterality: Left;   gallbladder  2012   GASTRIC BYPASS  2012   HERNIA REPAIR     REDUCTION MAMMAPLASTY Bilateral    2010    FAMILY HISTORY: Family History  Problem Relation Age of Onset   Breast cancer Mother 41   Diabetes Mother    Hyperlipidemia Mother    Hypertension Mother    Osteoporosis Mother        ? if has this    Cancer Mother    Prostate cancer Father    Hyperlipidemia Father    Hypertension Father    Depression Father    Multiple sclerosis Sister    Diabetes Sister    Osteoporosis Maternal Aunt    Heart disease Maternal Grandmother    Kidney Stones Maternal Grandfather    Stroke Maternal Grandfather    Cancer Maternal Grandfather        prostate cancer    Cancer Other        breast cancer    Breast cancer Other    Kidney disease Neg Hx    Bladder Cancer Neg Hx     SOCIAL HISTORY: Social History   Socioeconomic History   Marital status: Divorced    Spouse name: Not on file   Number of children: Not on file   Years of education: Not on file   Highest education level: Not on file  Occupational History   Not on file  Tobacco Use   Smoking status: Never    Passive exposure: Past   Smokeless tobacco: Never  Substance and Sexual Activity   Alcohol use: Never   Drug use: Never   Sexual activity: Yes  Other Topics Concern   Not on file  Social History Narrative   Bachelors    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 for [employer] in [place]   Married to significant other >as of 11/08/19 separated as of 01/31/22 dating partner lives in [place]       [employer] [facility]    Wears  seatbelt    Has gun at home    Safe in relationship          Caffeine : none   Right handed   Lives by herself   Social Drivers of Corporate Investment Banker  Strain: Not on file  Food Insecurity: Not on file  Transportation Needs: Not on file  Physical Activity: Not on file  Stress: Not on file  Social Connections: Not on file  Intimate Partner Violence: Not on file      PHYSICAL EXAM Generalized: Well developed, in no acute distress   Neurological examination  Mentation: Alert oriented to time, place, history taking. Follows all commands speech and language fluent Cranial nerve II-XII: Facial symmetry noted.   DIAGNOSTIC DATA (LABS, IMAGING, TESTING) - I reviewed patient records, labs, notes, testing and imaging myself where available.  Lab Results  Component Value Date   WBC 4.2 12/11/2023   HGB 13.1 12/11/2023   HCT 39.2 12/11/2023   MCV 94.4 12/11/2023   PLT 266.0 12/11/2023      Component Value Date/Time   NA 140 12/11/2023 0932   NA 140 11/09/2020 1424   K 4.0 12/11/2023 0932   CL 104 12/11/2023 0932   CO2 30 12/11/2023 0932   GLUCOSE 81 12/11/2023 0932   BUN 14 12/11/2023 0932   BUN 27 (H) 11/09/2020 1424   CREATININE 1.04 12/11/2023 0932   CREATININE 1.45 (H) 05/06/2019 1550   CALCIUM 9.2 12/11/2023 0932   PROT 7.6 12/11/2023 0932   PROT 7.4 11/09/2020 1424   ALBUMIN 4.0 12/11/2023 0932   ALBUMIN 4.2 11/09/2020 1424   AST 30 12/11/2023 0932   ALT 28 12/11/2023 0932   ALKPHOS 76 12/11/2023 0932   BILITOT 0.4 12/11/2023 0932   BILITOT 0.2 11/09/2020 1424   GFRNONAA >60 12/29/2020 2100   GFRAA >60 09/30/2018 0730   Lab Results  Component Value Date   CHOL 175 12/11/2023   HDL 87.40 12/11/2023   LDLCALC 78 12/11/2023   TRIG 50.0 12/11/2023   CHOLHDL 2 12/11/2023   Lab Results  Component Value Date   HGBA1C 5.8 12/11/2023   Lab Results  Component Value Date   VITAMINB12 1,157 (H) 12/11/2023   Lab Results  Component Value Date   TSH 1.09 12/11/2023      ASSESSMENT AND PLAN 53 y.o. 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 (11/23/2017), Cardiac murmur (02/12/2018), Chronic anemia (01/08/2015), Chronic fatigue (01/24/2016), Chronic gastritis (10/09/2017), Chronic pain of both knees (10/09/2017), COVID-19, Dizziness (01/24/2016), Emotional stress (11/09/2015), Epigastric abdominal pain (11/13/2017), Family history of osteoporosis (05/07/2018), GAD (generalized anxiety disorder) (11/20/2015), Heart murmur, History of hiatal hernia, Hypoglycemia after GI (gastrointestinal) surgery (11/26/2012), Hypotension (11/13/2017), Increased frequency of headaches (10/02/2017), Intermittent palpitations (11/09/2015), Kidney stones, Mass of finger, Migraine, Migraine without aura and without status migrainosus, not intractable (10/09/2017), Nephrolithiasis (02/29/2016), Obstructive apnea (01/20/2014), Ovarian cyst, left, Pelvic pain (10/09/2017), Postprandial hypoglycemia (02/12/2018), Shoulder pain, right (10/09/2017), Sleep apnea (07/18/2022), Snoring (07/31/2017), Soft tissue lesion of elbow region (10/09/2017), and Sprain of ligament of tarsometatarsal joint (04/01/2016). here with:  Migraine headaches  Continue Emgality  monthly injection Continue Ubrelvy  for abortive therapy.  Important to take at the onset of a migraine can repeat in 2 hours if headache has not resolved. Continue Zofran  for nausea Advised if headache frequency or  severity increases she should let us  know Follow-up in 1 year or sooner if needed   Meds ordered this encounter  Medications   Galcanezumab -gnlm (EMGALITY ) 120 MG/ML SOAJ    Sig: Inject 120 mg into the skin every 30 (thirty) days.    Dispense:  3 mL    Refill:  4    Supervising Provider:   YAN, YIJUN [[id number]]   ondansetron  (ZOFRAN  ODT) 4 MG disintegrating tablet    Sig: Take 1 tablet (4 mg total) by mouth every 8 (eight) hours as needed for nausea or  vomiting.    Dispense:  30 tablet    Refill:  6    Supervising Provider:   YAN, YIJUN [[id number]]   Ubrogepant  (UBRELVY ) 100 MG TABS    Sig: Take 1 tablet at the onset of a migraine.  Can repeat in 2 hours if needed.  Only 2 tablets in 24 hours    Dispense:  16 tablet    Refill:  12    Supervising Provider:   YAN, YIJUN [[id number]]     Duwaine Russell, MSN, NP-C 07/05/2024, 3:00 PM [facility] [address], [address] [place], [place] [address] [phone]  The patient's condition requires frequent monitoring and adjustments in the treatment plan, reflecting the ongoing complexity of care.  This provider is the continuing focal point for all needed services for this condition.

## 2024-07-05 NOTE — Patient Instructions (Signed)
 Continue Emgality  monthly injection Continue Ubrelvy  important take at the onset of a migraine.  Can repeat in 2 hours if headache is not resolved.  Only 2 tablets in 24 hours Zofran  refill

## 2024-07-06 NOTE — Telephone Encounter (Signed)
 Copied from CRM #[id number]. Topic: Clinical - Lab/Test Results >> Jul 06, 2024 12:06 PM Suzen RAMAN wrote: Reason for CRM: patient return call from [facility] to discuss her results of her MM 3D SCREENING MAMMOGRAM BILATERAL BREAST. Per policy E2C2 Agents are not allow to go over imaging results with patient.    CB#[id number]

## 2024-07-29 DIAGNOSIS — M7661 Achilles tendinitis, right leg: Secondary | ICD-10-CM | POA: Diagnosis not present

## 2024-08-05 ENCOUNTER — Ambulatory Visit

## 2024-08-05 VITALS — BP 102/80 | HR 66 | Temp 98.5°F | Ht 65.0 in | Wt 157.8 lb

## 2024-08-05 DIAGNOSIS — G4711 Idiopathic hypersomnia with long sleep time: Secondary | ICD-10-CM

## 2024-08-05 DIAGNOSIS — Z8639 Personal history of other endocrine, nutritional and metabolic disease: Secondary | ICD-10-CM

## 2024-08-05 DIAGNOSIS — R197 Diarrhea, unspecified: Secondary | ICD-10-CM

## 2024-08-05 DIAGNOSIS — F39 Unspecified mood [affective] disorder: Secondary | ICD-10-CM | POA: Diagnosis not present

## 2024-08-05 DIAGNOSIS — G43709 Chronic migraine without aura, not intractable, without status migrainosus: Secondary | ICD-10-CM

## 2024-08-05 DIAGNOSIS — K219 Gastro-esophageal reflux disease without esophagitis: Secondary | ICD-10-CM

## 2024-08-05 DIAGNOSIS — Z13220 Encounter for screening for lipoid disorders: Secondary | ICD-10-CM | POA: Diagnosis not present

## 2024-08-05 DIAGNOSIS — E611 Iron deficiency: Secondary | ICD-10-CM

## 2024-08-05 DIAGNOSIS — G4733 Obstructive sleep apnea (adult) (pediatric): Secondary | ICD-10-CM

## 2024-08-05 DIAGNOSIS — Z7689 Persons encountering health services in other specified circumstances: Secondary | ICD-10-CM

## 2024-08-05 DIAGNOSIS — Z9884 Bariatric surgery status: Secondary | ICD-10-CM | POA: Diagnosis not present

## 2024-08-05 DIAGNOSIS — N951 Menopausal and female climacteric states: Secondary | ICD-10-CM | POA: Diagnosis not present

## 2024-08-05 DIAGNOSIS — J301 Allergic rhinitis due to pollen: Secondary | ICD-10-CM | POA: Diagnosis not present

## 2024-08-05 DIAGNOSIS — L680 Hirsutism: Secondary | ICD-10-CM

## 2024-08-05 DIAGNOSIS — R7309 Other abnormal glucose: Secondary | ICD-10-CM

## 2024-08-05 LAB — LIPID PANEL
Cholesterol: 184 mg/dL (ref 28–200)
HDL: 84.3 mg/dL
LDL Cholesterol: 88 mg/dL (ref 10–99)
NonHDL: 100.17
Total CHOL/HDL Ratio: 2
Triglycerides: 60 mg/dL (ref 10.0–149.0)
VLDL: 12 mg/dL (ref 0.0–40.0)

## 2024-08-05 LAB — CBC WITH DIFFERENTIAL/PLATELET
Basophils Absolute: 0 K/uL (ref 0.0–0.1)
Basophils Relative: 0.6 % (ref 0.0–3.0)
Eosinophils Absolute: 0 K/uL (ref 0.0–0.7)
Eosinophils Relative: 1.1 % (ref 0.0–5.0)
HCT: 38.1 % (ref 36.0–46.0)
Hemoglobin: 12.8 g/dL (ref 12.0–15.0)
Lymphocytes Relative: 49.1 % — ABNORMAL HIGH (ref 12.0–46.0)
Lymphs Abs: 2 K/uL (ref 0.7–4.0)
MCHC: 33.4 g/dL (ref 30.0–36.0)
MCV: 93 fl (ref 78.0–100.0)
Monocytes Absolute: 0.3 K/uL (ref 0.1–1.0)
Monocytes Relative: 7 % (ref 3.0–12.0)
Neutro Abs: 1.7 K/uL (ref 1.4–7.7)
Neutrophils Relative %: 42.2 % — ABNORMAL LOW (ref 43.0–77.0)
Platelets: 249 K/uL (ref 150.0–400.0)
RBC: 4.1 Mil/uL (ref 3.87–5.11)
RDW: 12.9 % (ref 11.5–15.5)
WBC: 4.1 K/uL (ref 4.0–10.5)

## 2024-08-05 LAB — TSH: TSH: 0.93 u[IU]/mL (ref 0.35–5.50)

## 2024-08-05 LAB — VITAMIN D 25 HYDROXY (VIT D DEFICIENCY, FRACTURES): VITD: 38.77 ng/mL (ref 30.00–100.00)

## 2024-08-05 LAB — COMPREHENSIVE METABOLIC PANEL WITH GFR
ALT: 26 U/L (ref 3–35)
AST: 30 U/L (ref 5–37)
Albumin: 4 g/dL (ref 3.5–5.2)
Alkaline Phosphatase: 78 U/L (ref 39–117)
BUN: 17 mg/dL (ref 6–23)
CO2: 32 meq/L (ref 19–32)
Calcium: 9.5 mg/dL (ref 8.4–10.5)
Chloride: 103 meq/L (ref 96–112)
Creatinine, Ser: 0.92 mg/dL (ref 0.40–1.20)
GFR: 71.41 mL/min
Glucose, Bld: 85 mg/dL (ref 70–99)
Potassium: 4.5 meq/L (ref 3.5–5.1)
Sodium: 141 meq/L (ref 135–145)
Total Bilirubin: 0.4 mg/dL (ref 0.2–1.2)
Total Protein: 7.3 g/dL (ref 6.0–8.3)

## 2024-08-05 LAB — C-REACTIVE PROTEIN: CRP: <0.5 mg/dL — ABNORMAL LOW (ref 1.0–20.0)

## 2024-08-05 LAB — HEMOGLOBIN A1C: Hgb A1c MFr Bld: 5.9 % (ref 4.6–6.5)

## 2024-08-05 LAB — SEDIMENTATION RATE: Sed Rate: 24 mm/h (ref 0–30)

## 2024-08-05 LAB — VITAMIN B12: Vitamin B-12: 928 pg/mL — ABNORMAL HIGH (ref 211–911)

## 2024-08-05 MED ORDER — PANTOPRAZOLE SODIUM 40 MG PO TBEC: 40.0000 mg | DELAYED_RELEASE_TABLET | Freq: Every day | ORAL | 0 refills | Status: AC | PRN

## 2024-08-05 NOTE — Assessment & Plan Note (Addendum)
 Chronic, stable on Pantoprazole  40 mg prn, continue. Refill sent.  Orders:   pantoprazole  (PROTONIX ) 40 MG tablet; Take 1 tablet (40 mg total) by mouth daily as needed.

## 2024-08-05 NOTE — Assessment & Plan Note (Addendum)
 Takes prn otc Loratadine 10 mg for allergy symptoms.

## 2024-08-05 NOTE — Assessment & Plan Note (Addendum)
 Intermittent diarrhea. Physical exam reassuring. Discussed potential triggers including diet and medications. Track symptoms including food intake, sleep, medication and bowel habits. Avoid triggers.  Provided information on low FODMAP diet.  Ordered lab work: CMP, ESR, CRP, CBC, TSH Keep appointment with GI specialist, Dr. Aundria at [facility] for 11/2024. Colonoscopy 08/2021 ([facility] reassuring).  Orders:   TSH   Sedimentation rate   C-reactive protein

## 2024-08-05 NOTE — Patient Instructions (Signed)
-   For diarrhea please keep symptom journal, keep appointment with Dr. Aundria.

## 2024-08-05 NOTE — Progress Notes (Signed)
 "  Established Patient Office Visit TOC from Dr. Hope    Subjective  Patient ID: Valerie Colon, female    DOB: October 10, 1970  Age: 53 y.o. MRN: [id number]  Chief Complaint  Patient presents with   Establish Care   Diarrhea   Hemorrhoids    Discussed the use of AI scribe software for clinical note transcription with the patient, who gave verbal consent to proceed.  History of Present Illness Valerie Colon is a 53 year old female who presents to establish care with new PCP.  She has been experiencing intermittent diarrhea for the past few months, occurring regardless of food intake. Episodes are urgent, sometimes requiring her to leave meetings. She denies bleeding and nausea with these episodes. Her mother has a history of IBS. She has a h/o gastric bypass surgery, cholecystectomy. Colonoscopy from 08/2021 was reassuring.   She has a history of migraine headaches, managed with monthly Emgality  injections and ubrelvy  as needed. She experiences one to two headaches per month and can anticipate when her next injection is due. She also takes Zofran  as needed for nausea associated with her headaches. She follow sup with neurology for this.   She has sleep apnea and hypersomnia, managed with a CPAP machine and a daily Modafinil . Multiple sleep studies have been conducted, and she reports improvement in symptoms since starting the stimulant, although she previously struggled with fatigue and staying awake. She follows up with pulmonology clinic for this.   She underwent gastric bypass surgery and experiences occasional hypoglycemia post-surgery. She also has a history of low vitamin D  and takes supplements for it. She is not currently taking metformin but takes a combination of vitamin D  and K, and occasionally iron  supplements.  She has a history of Achilles tendinitis, which has resolved, and is not currently taking meloxicam. She also reports menopausal symptoms, managed with Vosevi for hot  flashes. She experienced severe back pain a couple of months ago, which has since resolved.  She has a history of GERD, for which she takes Protonix  as needed for heartburn. She reports that her prescription has run out and she needs a refill.     ROS As per HPI    Objective:     BP 102/80 (BP Location: Right Arm, Patient Position: Sitting, Cuff Size: Normal)   Pulse 66   Temp 98.5 F (36.9 C) (Oral)   Ht 5' 5 (1.651 m)   Wt 157 lb 12.8 oz (71.6 kg)   LMP  (LMP Unknown) Comment: neg upt  SpO2 98%   BMI 26.26 kg/m      08/05/2024    9:10 AM 12/03/2023    8:32 AM 11/26/2022   10:18 AM  Depression screen PHQ 2/9  Decreased Interest 0 0 0  Down, Depressed, Hopeless 0 0 0  PHQ - 2 Score 0 0 0  Altered sleeping 1 0   Tired, decreased energy 1 0   Change in appetite 0 0   Feeling bad or failure about yourself  0 0   Trouble concentrating 0 0   Moving slowly or fidgety/restless 0 0   Suicidal thoughts 0 0   PHQ-9 Score 2 0    Difficult doing work/chores Somewhat difficult Not difficult at all      Data saved with a previous flowsheet row definition      08/05/2024    9:10 AM 12/03/2023    8:32 AM 11/26/2022   10:18 AM 08/05/2022    1:17 PM  GAD 7 : Generalized Anxiety Score  Nervous, Anxious, on Edge 0 1 0 0  Control/stop worrying 0 0 0 0  Worry too much - different things 0 0  0  Trouble relaxing 0 0 0 0  Restless 0 0 0 0  Easily annoyed or irritable 1 0 0 0  Afraid - awful might happen 0 0 0 0  Total GAD 7 Score 1 1  0  Anxiety Difficulty Not difficult at all Not difficult at all Not difficult at all Not difficult at all      08/05/2024    9:10 AM 12/03/2023    8:32 AM 11/26/2022   10:18 AM  Depression screen PHQ 2/9  Decreased Interest 0 0 0  Down, Depressed, Hopeless 0 0 0  PHQ - 2 Score 0 0 0  Altered sleeping 1 0   Tired, decreased energy 1 0   Change in appetite 0 0   Feeling bad or failure about yourself  0 0   Trouble concentrating 0 0   Moving  slowly or fidgety/restless 0 0   Suicidal thoughts 0 0   PHQ-9 Score 2 0    Difficult doing work/chores Somewhat difficult Not difficult at all      Data saved with a previous flowsheet row definition      08/05/2024    9:10 AM 12/03/2023    8:32 AM 11/26/2022   10:18 AM 08/05/2022    1:17 PM  GAD 7 : Generalized Anxiety Score  Nervous, Anxious, on Edge 0 1 0 0  Control/stop worrying 0 0 0 0  Worry too much - different things 0 0  0  Trouble relaxing 0 0 0 0  Restless 0 0 0 0  Easily annoyed or irritable 1 0 0 0  Afraid - awful might happen 0 0 0 0  Total GAD 7 Score 1 1  0  Anxiety Difficulty Not difficult at all Not difficult at all Not difficult at all Not difficult at all   SDOH Screenings   Food Insecurity: No Food Insecurity (08/01/2024)  Housing: Low Risk (08/01/2024)  Transportation Needs: No Transportation Needs (08/01/2024)  Depression (PHQ2-9): Low Risk (08/05/2024)  Financial Resource Strain: Low Risk (08/01/2024)  Physical Activity: Sufficiently Active (08/01/2024)  Social Connections: Moderately Integrated (08/01/2024)  Stress: Stress Concern Present (08/01/2024)  Tobacco Use: Low Risk (08/05/2024)     Physical Exam Constitutional:      Appearance: Normal appearance.  HENT:     Head: Normocephalic and atraumatic.     Right Ear: Tympanic membrane normal. There is no impacted cerumen.     Left Ear: Tympanic membrane normal. There is no impacted cerumen.     Mouth/Throat:     Mouth: Mucous membranes are moist.  Neck:     Thyroid : No thyroid  mass or thyroid  tenderness.  Cardiovascular:     Rate and Rhythm: Normal rate and regular rhythm.  Pulmonary:     Effort: Pulmonary effort is normal.     Breath sounds: Normal breath sounds. No wheezing.  Abdominal:     General: Bowel sounds are normal. There is no distension.     Palpations: Abdomen is soft.     Tenderness: There is no abdominal tenderness. There is no guarding.  Musculoskeletal:     Cervical  back: Neck supple. No rigidity or tenderness.     Right lower leg: No edema.     Left lower leg: No edema.  Skin:    General: Skin is  warm.  Neurological:     Mental Status: She is alert and oriented to person, place, and time.  Psychiatric:        Mood and Affect: Mood normal.        Behavior: Behavior normal.        No results found for any visits on 08/05/24.  The 10-year ASCVD risk score (Arnett DK, et al., 2019) is: 0.5%     Assessment & Plan:  Right ankle pain for which she was seen at [facility] on 07/29/24 with reassuring imaging is now resolved with Meloxicam 7.5 mg. No longer taking Meloxicam. Recommend follow up appointment if pain reoccurs.  Assessment & Plan Gastroesophageal reflux disease without esophagitis Chronic, stable on Pantoprazole  40 mg prn, continue. Refill sent.  Orders:   pantoprazole  (PROTONIX ) 40 MG tablet; Take 1 tablet (40 mg total) by mouth daily as needed.  Diarrhea, unspecified type Intermittent diarrhea. Physical exam reassuring. Discussed potential triggers including diet and medications. Track symptoms including food intake, sleep, medication and bowel habits. Avoid triggers.  Provided information on low FODMAP diet.  Ordered lab work: CMP, ESR, CRP, CBC, TSH Keep appointment with GI specialist, Dr. Aundria at [facility] for 11/2024. Colonoscopy 08/2021 ([facility] reassuring).  Orders:   TSH   Sedimentation rate   C-reactive protein  H/O gastric bypass Check CBC, CMP, B12, zinc, iron  level. Management pending results.  Discussed potential for diarrhea related to post-surgical changes.  Orders:   CBC with Differential/Platelet   Comprehensive metabolic panel with GFR   B12   Zinc   Iron , TIBC and Ferritin Panel  Abnormal glucose Check A1c today.  Orders:   Hemoglobin A1c  Lipid screening Fasting lipid panel ordered.  Orders:   Lipid panel  History of vitamin D  deficiency Previously low vitamin D  levels, currently taking supplements.  Check  vitamin D  level.  Orders:   Vitamin D  (25 hydroxy)  Seen in weight management clinic Seeing Glendale Louder, DNP at [facility] who has been prescribing Zepbound and Fezolinetant. Continue f/u with [facility].      Hypersomnia, idiopathic Continue f/u with pulmonology for OSA and hypersomnia management. Doing well on Modafinil .      Mood disorder Previously treated with Buspar , mood has been stable off medication.     OSA (obstructive sleep apnea) Continue follow up with pulmonology and CPAP use.      Seasonal allergic rhinitis due to pollen Takes prn otc Loratadine 10 mg for allergy symptoms.      Menopausal symptoms Management and follow up with [facility]/ Glendale Louder DNP,  currently taking Veozah.     Chronic migraine without aura without status migrainosus, not intractable Stable on current therapy. Continue follow up with NP, Sherryl at [facility] neurology.      I personally spent a total of 50 minutes in the care of the patient today including preparing to see the patient, getting/reviewing separately obtained history, performing a medically appropriate exam/evaluation, counseling and educating, placing orders, documenting clinical information in the EHR, independently interpreting results, communicating results, and coordinating care.  Return for after 12/02/24 for annual physical .   Luke Shade, MD "

## 2024-08-05 NOTE — Assessment & Plan Note (Addendum)
 Seeing Glendale Louder, DNP at [facility] who has been prescribing Zepbound and Fezolinetant. Continue f/u with [facility].

## 2024-08-05 NOTE — Assessment & Plan Note (Addendum)
 Check A1c today.  Orders:   Hemoglobin A1c

## 2024-08-05 NOTE — Assessment & Plan Note (Addendum)
 Continue f/u with pulmonology for OSA and hypersomnia management. Doing well on Modafinil .

## 2024-08-05 NOTE — Assessment & Plan Note (Addendum)
 Previously treated with Buspar , mood has been stable off medication.

## 2024-08-05 NOTE — Assessment & Plan Note (Addendum)
 Management and follow up with [facility]/ Glendale Louder DNP,  currently taking Veozah.

## 2024-08-05 NOTE — Assessment & Plan Note (Addendum)
 Check CBC, CMP, B12, zinc, iron  level. Management pending results.  Discussed potential for diarrhea related to post-surgical changes.  Orders:   CBC with Differential/Platelet   Comprehensive metabolic panel with GFR   B12   Zinc   Iron , TIBC and Ferritin Panel

## 2024-08-05 NOTE — Assessment & Plan Note (Addendum)
 Continue follow up with pulmonology and CPAP use.

## 2024-08-05 NOTE — Assessment & Plan Note (Addendum)
 Stable on current therapy. Continue follow up with NP, Sherryl at [facility] neurology.

## 2024-08-05 NOTE — Assessment & Plan Note (Deleted)
 Valerie Colon

## 2024-08-05 NOTE — Assessment & Plan Note (Deleted)
 SABRA

## 2024-08-05 NOTE — Assessment & Plan Note (Addendum)
 Previously low vitamin D  levels, currently taking supplements.  Check vitamin D  level.  Orders:   Vitamin D  (25 hydroxy)

## 2024-08-05 NOTE — Assessment & Plan Note (Addendum)
 Fasting lipid panel ordered.  Orders:   Lipid panel

## 2024-08-07 ENCOUNTER — Ambulatory Visit: Payer: Self-pay

## 2024-08-07 DIAGNOSIS — R7989 Other specified abnormal findings of blood chemistry: Secondary | ICD-10-CM | POA: Insufficient documentation

## 2024-08-10 ENCOUNTER — Telehealth: Payer: Self-pay

## 2024-08-10 NOTE — Telephone Encounter (Signed)
 Copied from CRM #[id number]. Topic: General - Other >> Aug 10, 2024 11:11 AM Alexandria E wrote: Reason for CRM: Scheduled patient for a one month re-check of her B12 and CBC, but patient also wanted to relay that her Zepbound medication does not contain vitamin b12 or cyanocobalamin  in it.

## 2024-08-10 NOTE — Telephone Encounter (Signed)
 Noted.   Thank you,  Luke Shade, MD

## 2024-08-15 LAB — IRON,TIBC AND FERRITIN PANEL
%SAT: 26 % (ref 16–45)
Ferritin: 35 ng/mL (ref 16–232)
Iron: 102 ug/dL (ref 45–160)
TIBC: 400 ug/dL (ref 250–450)

## 2024-08-15 LAB — ZINC: Zinc: 95 ug/dL (ref 60–130)

## 2024-09-16 ENCOUNTER — Other Ambulatory Visit (INDEPENDENT_AMBULATORY_CARE_PROVIDER_SITE_OTHER)

## 2024-09-16 DIAGNOSIS — R7989 Other specified abnormal findings of blood chemistry: Secondary | ICD-10-CM | POA: Diagnosis not present

## 2024-09-16 LAB — CBC WITH DIFFERENTIAL/PLATELET
Basophils Absolute: 0 10*3/uL (ref 0.0–0.1)
Basophils Relative: 0.2 % (ref 0.0–3.0)
Eosinophils Absolute: 0 10*3/uL (ref 0.0–0.7)
Eosinophils Relative: 0.4 % (ref 0.0–5.0)
HCT: 36.6 % (ref 36.0–46.0)
Hemoglobin: 12.4 g/dL (ref 12.0–15.0)
Lymphocytes Relative: 42.3 % (ref 12.0–46.0)
Lymphs Abs: 2 10*3/uL (ref 0.7–4.0)
MCHC: 33.7 g/dL (ref 30.0–36.0)
MCV: 91.3 fl (ref 78.0–100.0)
Monocytes Absolute: 0.4 10*3/uL (ref 0.1–1.0)
Monocytes Relative: 8.9 % (ref 3.0–12.0)
Neutro Abs: 2.3 10*3/uL (ref 1.4–7.7)
Neutrophils Relative %: 48.2 % (ref 43.0–77.0)
Platelets: 240 10*3/uL (ref 150.0–400.0)
RBC: 4.01 Mil/uL (ref 3.87–5.11)
RDW: 13.6 % (ref 11.5–15.5)
WBC: 4.8 10*3/uL (ref 4.0–10.5)

## 2024-09-16 LAB — VITAMIN B12: Vitamin B-12: 986 pg/mL — ABNORMAL HIGH (ref 211–911)

## 2024-09-18 ENCOUNTER — Ambulatory Visit: Payer: Self-pay | Admitting: Family

## 2024-09-20 NOTE — Progress Notes (Signed)
"  Pt reviewed results via MyChart.   "

## 2024-12-09 ENCOUNTER — Encounter

## 2025-07-04 ENCOUNTER — Telehealth: Admitting: Adult Health
